# Patient Record
Sex: Male | Born: 1940
Health system: Southern US, Community
[De-identification: ages and names within clinical notes are randomized; demographics above are authoritative.]

## PROBLEM LIST (undated history)

## (undated) DIAGNOSIS — E119 Type 2 diabetes mellitus without complications: Secondary | ICD-10-CM

## (undated) DIAGNOSIS — K222 Esophageal obstruction: Secondary | ICD-10-CM

## (undated) DIAGNOSIS — N400 Enlarged prostate without lower urinary tract symptoms: Secondary | ICD-10-CM

## (undated) DIAGNOSIS — E785 Hyperlipidemia, unspecified: Secondary | ICD-10-CM

## (undated) DIAGNOSIS — K5790 Diverticulosis of intestine, part unspecified, without perforation or abscess without bleeding: Secondary | ICD-10-CM

## (undated) DIAGNOSIS — K449 Diaphragmatic hernia without obstruction or gangrene: Secondary | ICD-10-CM

## (undated) DIAGNOSIS — K219 Gastro-esophageal reflux disease without esophagitis: Secondary | ICD-10-CM

## (undated) DIAGNOSIS — N529 Male erectile dysfunction, unspecified: Secondary | ICD-10-CM

## (undated) DIAGNOSIS — R972 Elevated prostate specific antigen [PSA]: Secondary | ICD-10-CM

## (undated) HISTORY — DX: Gastro-esophageal reflux disease without esophagitis: K21.9

## (undated) HISTORY — DX: Type 2 diabetes mellitus without complications: E11.9

## (undated) HISTORY — DX: Esophageal obstruction: K22.2

## (undated) HISTORY — DX: Diaphragmatic hernia without obstruction or gangrene: K44.9

## (undated) HISTORY — DX: Male erectile dysfunction, unspecified: N52.9

## (undated) HISTORY — DX: Hyperlipidemia, unspecified: E78.5

## (undated) HISTORY — DX: Benign prostatic hyperplasia without lower urinary tract symptoms: N40.0

## (undated) HISTORY — DX: Benign prostatic hyperplasia without lower urinary tract symptoms: R97.20

## (undated) HISTORY — DX: Diverticulosis of intestine, part unspecified, without perforation or abscess without bleeding: K57.90

## (undated) HISTORY — PX: PROSTATE SURGERY: SHX751

---

## 2010-06-02 ENCOUNTER — Encounter: Admission: RE | Admit: 2010-06-02 | Discharge: 2010-06-02 | Payer: Self-pay | Admitting: Family Medicine

## 2010-12-28 ENCOUNTER — Encounter: Payer: Self-pay | Admitting: Family Medicine

## 2013-03-03 ENCOUNTER — Ambulatory Visit (INDEPENDENT_AMBULATORY_CARE_PROVIDER_SITE_OTHER): Payer: Medicare Other

## 2013-03-03 ENCOUNTER — Ambulatory Visit (INDEPENDENT_AMBULATORY_CARE_PROVIDER_SITE_OTHER): Payer: 59 | Admitting: Neurology

## 2013-03-03 DIAGNOSIS — Z0289 Encounter for other administrative examinations: Secondary | ICD-10-CM

## 2013-03-03 DIAGNOSIS — G544 Lumbosacral root disorders, not elsewhere classified: Secondary | ICD-10-CM

## 2013-03-03 DIAGNOSIS — R209 Unspecified disturbances of skin sensation: Secondary | ICD-10-CM

## 2013-03-03 NOTE — Procedures (Signed)
HISTORY:  Bryan Mccullough is a 72 year old gentleman with a 7 month history of some discomfort involving the left leg. The patient has numbness from the knee down into the foot and including the big toe. The patient has some discomfort in this distribution as well. Occasionally, the patient may note similar numbness on the right leg. The patient does have some back pain at times. The patient has a history of lumbosacral spine surgery done 27 years ago. The patient is being evaluated for a possible neuropathy or a lumbosacral radiculopathy.  NERVE CONDUCTION STUDIES:  Nerve conduction studies were performed on both lower extremities. The distal motor latencies for the peroneal nerves and posterior tibial nerves were within normal limits bilaterally. The motor amplitudes for the peroneal nerves were normal on the right, low on the left, and normal for the posterior tibial nerves bilaterally. The nerve conduction velocities for the peroneal and posterior tibial nerves were normal bilaterally. The peroneal sensory latencies were normal laterally. The H reflex latencies were symmetric and normal.   EMG STUDIES:  EMG study was performed on the left lower extremity:  The tibialis anterior muscle reveals 2 to 5K motor units with decreased recruitment. No fibrillations or positive waves were seen. The peroneus tertius muscle reveals 2 to 4K motor units with decreased recruitment. 1 plus fibrillations and positive waves were seen. The medial gastrocnemius muscle reveals 3 to 6K motor units with decreased recruitment. No fibrillations or positive waves were seen. The vastus lateralis muscle reveals 2 to 4K motor units with full recruitment. No fibrillations or positive waves were seen. The iliopsoas muscle reveals 2 to 4K motor units with full recruitment. No fibrillations or positive waves were seen. The biceps femoris muscle (long head) reveals 2 to 6K motor units with decreased recruitment. 2 plus  fibrillations and positive waves were seen. The lumbosacral paraspinal muscles were tested at 3 levels, and revealed no abnormalities of insertional activity in the upper 2 levels tested. Complex repetitive discharges were seen at the lower level. There was good relaxation.  EMG study was performed on the right lower extremity:  The tibialis anterior muscle reveals 2 to 4K motor units with full recruitment. No fibrillations or positive waves were seen. The peroneus tertius muscle reveals 2 to 4K motor units with full recruitment. No fibrillations or positive waves were seen. The medial gastrocnemius muscle reveals 1 to 3K motor units with full recruitment. No fibrillations or positive waves were seen. The vastus lateralis muscle reveals 2 to 4K motor units with full recruitment. No fibrillations or positive waves were seen. The iliopsoas muscle reveals 2 to 4K motor units with full recruitment. No fibrillations or positive waves were seen. The biceps femoris muscle (long head) reveals 2 to 4K motor units with full recruitment. No fibrillations or positive waves were seen. The lumbosacral paraspinal muscles were tested at 3 levels, and revealed no abnormalities of insertional activity at all 3 levels tested. There was good relaxation.    IMPRESSION:  Nerve conduction studies done on both lower extremities show no clear evidence of a peripheral neuropathy. The motor amplitudes of the left peroneal nerve was low, with a normal sensory latency. This finding is consistent with the EMG evaluation of the left lower extremity which shows evidence of a mixed chronic and acute L5 radiculopathy, and evidence of a chronic S1 radiculopathy. No abnormalities were noted on the right lower extremity. There is no evidence of a right lumbosacral radiculopathy.  Bryan Palau MD 03/03/2013 4:49 PM  [facility] [address] [place], [place] [address]  Phone [phone] Fax  [phone]

## 2013-03-16 ENCOUNTER — Telehealth: Payer: Self-pay | Admitting: Neurology

## 2013-03-16 NOTE — Telephone Encounter (Signed)
I called the patient and I left a message with the results of the nerve conduction study. I had already gone over this with him prior to him leaving our office. The nerve conduction studies show no evidence of a peripheral neuropathy. EMG study of the right leg was unremarkable, the left leg showed evidence of an acute and chronic L5 radiculopathy, chronic S1 radiculopathy.

## 2013-03-16 NOTE — Telephone Encounter (Signed)
Patient came into office today to find out results of NCV/EMG.  I told him doctor would have to review results and then he would receive a call with results.  He can be reached at [facility] [phone] or Cell [phone].

## 2013-03-20 NOTE — Telephone Encounter (Signed)
Noted  

## 2013-08-25 ENCOUNTER — Other Ambulatory Visit: Payer: 59

## 2013-08-25 DIAGNOSIS — E559 Vitamin D deficiency, unspecified: Secondary | ICD-10-CM

## 2013-08-25 DIAGNOSIS — Z0000 Encounter for general adult medical examination without abnormal findings: Secondary | ICD-10-CM

## 2013-08-25 DIAGNOSIS — E782 Mixed hyperlipidemia: Secondary | ICD-10-CM

## 2013-08-25 LAB — CBC WITH DIFFERENTIAL/PLATELET
Eosinophils Absolute: 0.1 10*3/uL (ref 0.0–0.7)
Eosinophils Relative: 1 % (ref 0–5)
HCT: 44.7 % (ref 39.0–52.0)
Hemoglobin: 15.4 g/dL (ref 13.0–17.0)
Lymphs Abs: 2.1 10*3/uL (ref 0.7–4.0)
MCH: 29.7 pg (ref 26.0–34.0)
MCV: 86.3 fL (ref 78.0–100.0)
Monocytes Relative: 7 % (ref 3–12)
Neutro Abs: 2.3 10*3/uL (ref 1.7–7.7)
RBC: 5.18 MIL/uL (ref 4.22–5.81)

## 2013-08-25 LAB — COMPREHENSIVE METABOLIC PANEL
Albumin: 4.2 g/dL (ref 3.5–5.2)
Alkaline Phosphatase: 87 U/L (ref 39–117)
BUN: 19 mg/dL (ref 6–23)
CO2: 28 mEq/L (ref 19–32)
Calcium: 9.6 mg/dL (ref 8.4–10.5)
Sodium: 140 mEq/L (ref 135–145)

## 2013-08-25 LAB — LIPID PANEL
HDL: 35 mg/dL — ABNORMAL LOW (ref >39)
Total CHOL/HDL Ratio: 4.8 Ratio
Triglycerides: 155 mg/dL — ABNORMAL HIGH (ref <150)
VLDL: 31 mg/dL (ref 0–40)

## 2013-08-26 LAB — VITAMIN D 25 HYDROXY (VIT D DEFICIENCY, FRACTURES): Vit D, 25-Hydroxy: 16 ng/mL — ABNORMAL LOW (ref 30–89)

## 2013-08-31 ENCOUNTER — Ambulatory Visit (INDEPENDENT_AMBULATORY_CARE_PROVIDER_SITE_OTHER): Payer: 59 | Admitting: Family Medicine

## 2013-08-31 ENCOUNTER — Encounter: Payer: Self-pay | Admitting: Family Medicine

## 2013-08-31 VITALS — BP 110/60 | HR 68 | Temp 98.5°F | Resp 14 | Ht 68.0 in | Wt 136.0 lb

## 2013-08-31 DIAGNOSIS — N529 Male erectile dysfunction, unspecified: Secondary | ICD-10-CM | POA: Insufficient documentation

## 2013-08-31 DIAGNOSIS — N400 Enlarged prostate without lower urinary tract symptoms: Secondary | ICD-10-CM | POA: Insufficient documentation

## 2013-08-31 DIAGNOSIS — Z23 Encounter for immunization: Secondary | ICD-10-CM

## 2013-08-31 DIAGNOSIS — Z0000 Encounter for general adult medical examination without abnormal findings: Secondary | ICD-10-CM

## 2013-08-31 DIAGNOSIS — K222 Esophageal obstruction: Secondary | ICD-10-CM | POA: Insufficient documentation

## 2013-08-31 NOTE — Progress Notes (Signed)
Subjective:    Patient ID: Bryan Mccullough, male    DOB: June 16, 1941, 72 y.o.   MRN: [id number]  HPI Patient is a 72 year old Hispanic male who is here today for his complete physical exam. He has no major concerns other than some mild pain in his right shoulder with abduction. He also complains of erectile dysfunction. He is interested in using Viagra. He is tried Levitra in the past with success.  per his report, he had a colonoscopy performed last year by Dr. Elnoria Howard.  His prostate examined every 6 months by his urologist Dr. Mena Goes.  He is due for a flu shot. His last tetanus shot was in August 2007. He is overdue for a pneumonia shot and a shingles shot. He declines both shots today.  He would like to receive his flu shot. Past Medical History  Diagnosis Date  . GERD (gastroesophageal reflux disease)   . BPH with elevated PSA   . Diverticulosis   . Hiatal hernia   . Esophageal stricture   . Erectile dysfunction    Past Surgical History  Procedure Laterality Date  . Prostate surgery     Current outpatient prescriptions:finasteride (PROSCAR) 5 MG tablet, Take 5 mg by mouth daily., Disp: , Rfl: ;  tamsulosin (FLOMAX) 0.4 MG CAPS capsule, Take by mouth., Disp: , Rfl:   Allergies  Allergen Reactions  . Penicillins    History   Social History  . Marital Status: Single    Spouse Name: N/A    Number of Children: N/A  . Years of Education: N/A   Occupational History  . Not on file.   Social History Main Topics  . Smoking status: Never Smoker   . Smokeless tobacco: Never Used  . Alcohol Use: No  . Drug Use: No  . Sexual Activity: Yes     Comment: married, bus driver   Other Topics Concern  . Not on file   Social History Narrative  . No narrative on file   Family History  Problem Relation Age of Onset  . Heart disease Brother   . Stroke Brother       Review of Systems  All other systems reviewed and are negative.       Objective:   Physical Exam  Vitals  reviewed. Constitutional: He is oriented to person, place, and time. He appears well-developed and well-nourished. No distress.  HENT:  Head: Normocephalic and atraumatic.  Right Ear: External ear normal.  Left Ear: External ear normal.  Nose: Nose normal.  Mouth/Throat: Oropharynx is clear and moist. No oropharyngeal exudate.  Eyes: Conjunctivae and EOM are normal. Pupils are equal, round, and reactive to light. Right eye exhibits no discharge. Left eye exhibits no discharge. No scleral icterus.  Neck: Normal range of motion. Neck supple. No JVD present. No tracheal deviation present. No thyromegaly present.  Cardiovascular: Normal rate, regular rhythm, normal heart sounds and intact distal pulses.  Exam reveals no gallop and no friction rub.   No murmur heard. Pulmonary/Chest: Effort normal and breath sounds normal. No stridor. No respiratory distress. He has no wheezes. He has no rales. He exhibits no tenderness.  Abdominal: Soft. Bowel sounds are normal. He exhibits no distension and no mass. There is no tenderness. There is no rebound and no guarding.  Musculoskeletal: Normal range of motion. He exhibits no edema and no tenderness.  Lymphadenopathy:    He has no cervical adenopathy.  Neurological: He is alert and oriented to person, place, and time. He has  normal reflexes. He displays normal reflexes. No cranial nerve deficit. He exhibits normal muscle tone. Coordination normal.  Skin: Skin is warm and dry. No rash noted. He is not diaphoretic. No erythema. No pallor.  Psychiatric: He has a normal mood and affect. His behavior is normal. Judgment and thought content normal.   patient has a positive empty can sign in the right shoulder. He has a negative Spurling's sign.        Assessment & Plan:  1. Routine general medical examination at a health care facility Patient's most recent labwork as listed below: Lab on 08/25/2013  Component Date Value Range Status  . WBC 08/25/2013 4.8   4.0 - 10.5 K/uL Final  . RBC 08/25/2013 5.18  4.22 - 5.81 MIL/uL Final  . Hemoglobin 08/25/2013 15.4  13.0 - 17.0 g/dL Final  . HCT 16/09/9603 44.7  39.0 - 52.0 % Final  . MCV 08/25/2013 86.3  78.0 - 100.0 fL Final  . MCH 08/25/2013 29.7  26.0 - 34.0 pg Final  . MCHC 08/25/2013 34.5  30.0 - 36.0 g/dL Final  . RDW 54/08/8118 12.9  11.5 - 15.5 % Final  . Platelets 08/25/2013 198  150 - 400 K/uL Final  . Neutrophils Relative % 08/25/2013 48  43 - 77 % Final  . Neutro Abs 08/25/2013 2.3  1.7 - 7.7 K/uL Final  . Lymphocytes Relative 08/25/2013 43  12 - 46 % Final  . Lymphs Abs 08/25/2013 2.1  0.7 - 4.0 K/uL Final  . Monocytes Relative 08/25/2013 7  3 - 12 % Final  . Monocytes Absolute 08/25/2013 0.3  0.1 - 1.0 K/uL Final  . Eosinophils Relative 08/25/2013 1  0 - 5 % Final  . Eosinophils Absolute 08/25/2013 0.1  0.0 - 0.7 K/uL Final  . Basophils Relative 08/25/2013 1  0 - 1 % Final  . Basophils Absolute 08/25/2013 0.0  0.0 - 0.1 K/uL Final  . Smear Review 08/25/2013 Criteria for review not met   Final  . Sodium 08/25/2013 140  135 - 145 mEq/L Final  . Potassium 08/25/2013 4.6  3.5 - 5.3 mEq/L Final  . Chloride 08/25/2013 105  96 - 112 mEq/L Final  . CO2 08/25/2013 28  19 - 32 mEq/L Final  . Glucose, Bld 08/25/2013 100* 70 - 99 mg/dL Final  . BUN 14/78/2956 19  6 - 23 mg/dL Final  . Creat 21/30/8657 0.87  0.50 - 1.35 mg/dL Final  . Total Bilirubin 08/25/2013 1.0  0.3 - 1.2 mg/dL Final  . Alkaline Phosphatase 08/25/2013 87  39 - 117 U/L Final  . AST 08/25/2013 19  0 - 37 U/L Final  . ALT 08/25/2013 14  0 - 53 U/L Final  . Total Protein 08/25/2013 6.9  6.0 - 8.3 g/dL Final  . Albumin 84/69/6295 4.2  3.5 - 5.2 g/dL Final  . Calcium 28/41/3244 9.6  8.4 - 10.5 mg/dL Final  . Cholesterol 12/09/7251 169  0 - 200 mg/dL Final   Comment: ATP III Classification:                                < 200        mg/dL        Desirable                               200 - 239  mg/dL        Borderline  High                               >= 240        mg/dL        High                             . Triglycerides 08/25/2013 155* <150 mg/dL Final  . HDL 14/78/2956 35* >39 mg/dL Final  . Total CHOL/HDL Ratio 08/25/2013 4.8   Final  . VLDL 08/25/2013 31  0 - 40 mg/dL Final  . LDL Cholesterol 08/25/2013 103* 0 - 99 mg/dL Final   Comment:                            Total Cholesterol/HDL Ratio:CHD Risk                                                 Coronary Heart Disease Risk Table                                                                 Men       Women                                   1/2 Average Risk              3.4        3.3                                       Average Risk              5.0        4.4                                    2X Average Risk              9.6        7.1                                    3X Average Risk             23.4       11.0                          Use the calculated Patient Ratio above and the CHD Risk table                           to determine the patient's CHD Risk.  ATP III Classification (LDL):                                < 100        mg/dL         Optimal                               100 - 129     mg/dL         Near or Above Optimal                               130 - 159     mg/dL         Borderline High                               160 - 189     mg/dL         High                                > 190        mg/dL         Very High                             . Vit D, 25-Hydroxy 08/25/2013 16* 30 - 89 ng/mL Final   Comment: This assay accurately quantifies Vitamin D, which is the sum of the                          25-Hydroxy forms of Vitamin D2 and D3.  Studies have shown that the                          optimum concentration of 25-Hydroxy Vitamin D is 30 ng/mL or higher.                           Concentrations of Vitamin D between 20 and 29 ng/mL are considered to                          be insufficient  and concentrations less than 20 ng/mL are considered                          to be deficient for Vitamin D.   Labs are significant only for an HDL of 35 and a vitamin D level of 16. Recommended increasing aerobic exercise to 30 minutes a day 5 days a week. I recommended that he take vitamin D 1000 units by mouth daily.  I like to recheck the patient in 6 months. The patient received his flu shot today in clinic. I strongly recommended a Pneumovax as well as Zostavax, but the patient defers for now.  Otherwise his prostate cancer screening and colonoscopy are up-to-date. His regular preventive care is up to date. I believe the pain in his right shoulder is likely subacromial bursitis/supraspinatous tendinitis.  He can take ibuprofen as needed for pain.  Recommend cortisone shot if worsening.

## 2013-08-31 NOTE — Addendum Note (Signed)
Addended by: Legrand Rams B on: 08/31/2013 01:06 PM   Modules accepted: Orders

## 2013-09-08 ENCOUNTER — Encounter: Payer: Medicare Other | Admitting: Family Medicine

## 2014-10-01 ENCOUNTER — Ambulatory Visit (INDEPENDENT_AMBULATORY_CARE_PROVIDER_SITE_OTHER): Payer: Medicare HMO | Admitting: Family Medicine

## 2014-10-01 ENCOUNTER — Encounter: Payer: Self-pay | Admitting: Family Medicine

## 2014-10-01 VITALS — BP 140/76 | HR 60 | Temp 98.0°F | Resp 14 | Ht 68.0 in | Wt 136.0 lb

## 2014-10-01 DIAGNOSIS — M542 Cervicalgia: Secondary | ICD-10-CM

## 2014-10-01 DIAGNOSIS — G8929 Other chronic pain: Secondary | ICD-10-CM

## 2014-10-01 DIAGNOSIS — M7551 Bursitis of right shoulder: Secondary | ICD-10-CM

## 2014-10-01 DIAGNOSIS — M7591 Shoulder lesion, unspecified, right shoulder: Secondary | ICD-10-CM

## 2014-10-01 MED ORDER — MELOXICAM 15 MG PO TABS: 15.0000 mg | ORAL_TABLET | Freq: Every day | ORAL | Status: DC

## 2014-10-01 NOTE — Progress Notes (Signed)
Subjective:    Patient ID: Bryan Mccullough, male    DOB: 1941/12/04, 73 y.o.   MRN: [id number]  HPI Patient presents with 3 months of pain in his neck. The pain is located just superior to C7. The patient has palpable crepitus with range of motion in the neck including rotation and flexion and extension. He complains of a dull constant pain worse with head motion. He denies any injury. He also complains of pain in his right shoulder which is worse with abduction greater than 90 and external rotation of the pain is located in the subacromial space and radiates down the right arm to the mid biceps. He also some tenderness to palpation at the insertion of the biceps on the humerus. He denies any specific injury. This has been ongoing for several months. It was exacerbated by his job. Past Medical History  Diagnosis Date  . GERD (gastroesophageal reflux disease)   . BPH with elevated PSA   . Diverticulosis   . Hiatal hernia   . Esophageal stricture   . Erectile dysfunction    Past Surgical History  Procedure Laterality Date  . Prostate surgery     Current Outpatient Prescriptions on File Prior to Visit  Medication Sig Dispense Refill  . finasteride (PROSCAR) 5 MG tablet Take 5 mg by mouth daily.      . tamsulosin (FLOMAX) 0.4 MG CAPS capsule Take by mouth.       No current facility-administered medications on file prior to visit.   Allergies  Allergen Reactions  . Penicillins    History   Social History  . Marital Status: Single    Spouse Name: N/A    Number of Children: N/A  . Years of Education: N/A   Occupational History  . Not on file.   Social History Main Topics  . Smoking status: Never Smoker   . Smokeless tobacco: Never Used  . Alcohol Use: No  . Drug Use: No  . Sexual Activity: Yes     Comment: married, bus driver   Other Topics Concern  . Not on file   Social History Narrative  . No narrative on file      Review of Systems  All other systems  reviewed and are negative.      Objective:   Physical Exam  Vitals reviewed. Cardiovascular: Normal rate, regular rhythm and normal heart sounds.   No murmur heard. Pulmonary/Chest: Effort normal and breath sounds normal. No respiratory distress. He has no wheezes. He has no rales.  Musculoskeletal:       Right shoulder: He exhibits decreased range of motion, tenderness and pain. He exhibits no bony tenderness, no effusion, no crepitus, no deformity and normal strength.       Cervical back: He exhibits decreased range of motion, tenderness and pain. He exhibits no bony tenderness.          Assessment & Plan:  Neck pain of over 3 months duration - Plan: meloxicam (MOBIC) 15 MG tablet  Supraspinatus tendonitis, right  I suspect degenerative disc disease in the neck along with spondylosis.  I suspect the patient's shoulder pain is due to tendinitis in his rotator cuff and possibly his biceps tendon. I will try to treat both situations simultaneously with meloxicam 15 mg by mouth daily. I recommended the patient begin Zantac 150 mg by mouth daily at bedtime given his mild GERD to help prevent exacerbations of this. Recheck in 2 weeks. If no better at that time proceed  with an x-ray of the neck and the shoulder. I would also consider a cortisone injection in the shoulder at that time if no better..Marland Kitchen

## 2015-06-07 ENCOUNTER — Other Ambulatory Visit: Payer: Medicare HMO

## 2015-06-07 DIAGNOSIS — N400 Enlarged prostate without lower urinary tract symptoms: Secondary | ICD-10-CM

## 2015-06-07 DIAGNOSIS — E559 Vitamin D deficiency, unspecified: Secondary | ICD-10-CM

## 2015-06-07 DIAGNOSIS — Z79899 Other long term (current) drug therapy: Secondary | ICD-10-CM

## 2015-06-07 LAB — CBC WITH DIFFERENTIAL/PLATELET
BASOS ABS: 0 10*3/uL (ref 0.0–0.1)
BASOS PCT: 0 % (ref 0–1)
EOS ABS: 0.1 10*3/uL (ref 0.0–0.7)
EOS PCT: 2 % (ref 0–5)
HEMATOCRIT: 44.5 % (ref 39.0–52.0)
HEMOGLOBIN: 15.5 g/dL (ref 13.0–17.0)
LYMPHS ABS: 2.6 10*3/uL (ref 0.7–4.0)
LYMPHS PCT: 47 % — AB (ref 12–46)
MCH: 29.9 pg (ref 26.0–34.0)
MCHC: 34.8 g/dL (ref 30.0–36.0)
MCV: 85.9 fL (ref 78.0–100.0)
MONO ABS: 0.4 10*3/uL (ref 0.1–1.0)
MONOS PCT: 8 % (ref 3–12)
MPV: 9.6 fL (ref 8.6–12.4)
NEUTROS PCT: 43 % (ref 43–77)
Neutro Abs: 2.4 10*3/uL (ref 1.7–7.7)
PLATELETS: 173 10*3/uL (ref 150–400)
RBC: 5.18 MIL/uL (ref 4.22–5.81)
RDW: 13.7 % (ref 11.5–15.5)
WBC: 5.6 10*3/uL (ref 4.0–10.5)

## 2015-06-07 LAB — COMPLETE METABOLIC PANEL WITH GFR
ALBUMIN: 3.9 g/dL (ref 3.5–5.2)
ALT: 17 U/L (ref 0–53)
AST: 20 U/L (ref 0–37)
Alkaline Phosphatase: 82 U/L (ref 39–117)
BUN: 22 mg/dL (ref 6–23)
CO2: 25 mEq/L (ref 19–32)
Calcium: 9.1 mg/dL (ref 8.4–10.5)
Chloride: 104 mEq/L (ref 96–112)
Creat: 0.79 mg/dL (ref 0.50–1.35)
Glucose, Bld: 93 mg/dL (ref 70–99)
POTASSIUM: 4.1 meq/L (ref 3.5–5.3)
SODIUM: 141 meq/L (ref 135–145)
TOTAL PROTEIN: 6.7 g/dL (ref 6.0–8.3)
Total Bilirubin: 0.8 mg/dL (ref 0.2–1.2)

## 2015-06-07 LAB — LIPID PANEL
Cholesterol: 171 mg/dL (ref 0–200)
HDL: 33 mg/dL — ABNORMAL LOW (ref >=40)
LDL CALC: 93 mg/dL (ref 0–99)
TRIGLYCERIDES: 223 mg/dL — AB (ref <150)
Total CHOL/HDL Ratio: 5.2 Ratio
VLDL: 45 mg/dL — ABNORMAL HIGH (ref 0–40)

## 2015-06-08 LAB — VITAMIN D 25 HYDROXY (VIT D DEFICIENCY, FRACTURES): VIT D 25 HYDROXY: 13 ng/mL — AB (ref 30–100)

## 2015-06-13 ENCOUNTER — Ambulatory Visit: Payer: Medicare HMO | Admitting: Physician Assistant

## 2015-06-17 ENCOUNTER — Encounter: Payer: Self-pay | Admitting: Family Medicine

## 2015-06-17 ENCOUNTER — Ambulatory Visit (INDEPENDENT_AMBULATORY_CARE_PROVIDER_SITE_OTHER): Payer: Medicare HMO | Admitting: Family Medicine

## 2015-06-17 VITALS — BP 118/60 | HR 78 | Temp 98.1°F | Resp 14 | Ht 68.0 in | Wt 138.0 lb

## 2015-06-17 DIAGNOSIS — R1032 Left lower quadrant pain: Secondary | ICD-10-CM

## 2015-06-17 DIAGNOSIS — G8929 Other chronic pain: Secondary | ICD-10-CM

## 2015-06-17 DIAGNOSIS — R1031 Right lower quadrant pain: Secondary | ICD-10-CM

## 2015-06-17 NOTE — Progress Notes (Signed)
Subjective:    Patient ID: Bryan Mccullough, male    DOB: 19-Oct-1941, 74 y.o.   MRN: [id number]  HPI Patient is reporting vague abdominal pain.  It is located in the right lower quadrant and the left lower quadrant. It has been there for several years. He is seeing GI was performed a colonoscopy which was significant only for diverticulosis and internal hemorrhoids. He sees a urologist for BPH and has his prostate checked annually. They also check his PSA.  Patient had similar pain dating all the way back to 2011. I checked a CT scan at that time which revealed no abnormalities other than a thick walled bladder and enlarged prostate. The pain has not changed. It occurs on a daily basis. It is a bloating feeling that tends to improve with bowel movements. Patient states that he is not having a bowel movement but only every 3 days. They tend to be hard. He denies any melanoma or hematochezia. He denies any fevers or chills. He denies any nausea or vomiting or bilious emesis. He denies any weight loss. He denies any rash. He does continue have mild constipation. Movement does not exacerbate the pain. His most recent lab work as listed below Lab on 06/07/2015  Component Date Value Ref Range Status  . Sodium 06/07/2015 141  135 - 145 mEq/L Final  . Potassium 06/07/2015 4.1  3.5 - 5.3 mEq/L Final  . Chloride 06/07/2015 104  96 - 112 mEq/L Final  . CO2 06/07/2015 25  19 - 32 mEq/L Final  . Glucose, Bld 06/07/2015 93  70 - 99 mg/dL Final  . BUN 06/07/2015 22  6 - 23 mg/dL Final  . Creat 06/07/2015 0.79  0.50 - 1.35 mg/dL Final  . Total Bilirubin 06/07/2015 0.8  0.2 - 1.2 mg/dL Final  . Alkaline Phosphatase 06/07/2015 82  39 - 117 U/L Final  . AST 06/07/2015 20  0 - 37 U/L Final  . ALT 06/07/2015 17  0 - 53 U/L Final  . Total Protein 06/07/2015 6.7  6.0 - 8.3 g/dL Final  . Albumin 06/07/2015 3.9  3.5 - 5.2 g/dL Final  . Calcium 06/07/2015 9.1  8.4 - 10.5 mg/dL Final  . GFR, Est African American  06/07/2015 >89   Final  . GFR, Est Non African American 06/07/2015 >89   Final   Comment:   The estimated GFR is a calculation valid for adults (>=82 years old) that uses the CKD-EPI algorithm to adjust for age and sex. It is   not to be used for children, pregnant women, hospitalized patients,    patients on dialysis, or with rapidly changing kidney function. According to the NKDEP, eGFR >89 is normal, 60-89 shows mild impairment, 30-59 shows moderate impairment, 15-29 shows severe impairment and <15 is ESRD.     Marland Kitchen Cholesterol 06/07/2015 171  0 - 200 mg/dL Final   Comment: ATP III Classification:       < 200        mg/dL        Desirable      200 - 239     mg/dL        Borderline High      >= 240        mg/dL        High     . Triglycerides 06/07/2015 223* <150 mg/dL Final  . HDL 06/07/2015 33* >=40 mg/dL Final  . Total CHOL/HDL Ratio 06/07/2015 5.2   Final  .  VLDL 06/07/2015 45* 0 - 40 mg/dL Final  . LDL Cholesterol 06/07/2015 93  0 - 99 mg/dL Final   Comment:   Total Cholesterol/HDL Ratio:CHD Risk                        Coronary Heart Disease Risk Table                                        Men       Women          1/2 Average Risk              3.4        3.3              Average Risk              5.0        4.4           2X Average Risk              9.6        7.1           3X Average Risk             23.4       11.0 Use the calculated Patient Ratio above and the CHD Risk table  to determine the patient's CHD Risk. ATP III Classification (LDL):       < 100        mg/dL         Optimal      100 - 129     mg/dL         Near or Above Optimal      130 - 159     mg/dL         Borderline High      160 - 189     mg/dL         High       > 190        mg/dL         Very High     . WBC 06/07/2015 5.6  4.0 - 10.5 K/uL Final  . RBC 06/07/2015 5.18  4.22 - 5.81 MIL/uL Final  . Hemoglobin 06/07/2015 15.5  13.0 - 17.0 g/dL Final  . HCT 06/07/2015 44.5  39.0 - 52.0 % Final  . MCV  06/07/2015 85.9  78.0 - 100.0 fL Final  . MCH 06/07/2015 29.9  26.0 - 34.0 pg Final  . MCHC 06/07/2015 34.8  30.0 - 36.0 g/dL Final  . RDW 06/07/2015 13.7  11.5 - 15.5 % Final  . Platelets 06/07/2015 173  150 - 400 K/uL Final  . MPV 06/07/2015 9.6  8.6 - 12.4 fL Final  . Neutrophils Relative % 06/07/2015 43  43 - 77 % Final  . Neutro Abs 06/07/2015 2.4  1.7 - 7.7 K/uL Final  . Lymphocytes Relative 06/07/2015 47* 12 - 46 % Final  . Lymphs Abs 06/07/2015 2.6  0.7 - 4.0 K/uL Final  . Monocytes Relative 06/07/2015 8  3 - 12 % Final  . Monocytes Absolute 06/07/2015 0.4  0.1 - 1.0 K/uL Final  . Eosinophils Relative 06/07/2015 2  0 - 5 % Final  . Eosinophils Absolute 06/07/2015 0.1  0.0 - 0.7 K/uL Final  . Basophils Relative  06/07/2015 0  0 - 1 % Final  . Basophils Absolute 06/07/2015 0.0  0.0 - 0.1 K/uL Final  . Smear Review 06/07/2015 Criteria for review not met   Final  . Vit D, 25-Hydroxy 06/07/2015 13* 30 - 100 ng/mL Final   Comment: Vitamin D Status           25-OH Vitamin D        Deficiency                <20 ng/mL        Insufficiency         20 - 29 ng/mL        Optimal             > or = 30 ng/mL   For 25-OH Vitamin D testing on patients on D2-supplementation and patients for whom quantitation of D2 and D3 fractions is required, the [facility] 25-OH VIT D, (D2,D3), LC/MS/MS is recommended: order code [id number] (patients > 2 yrs).    Past Medical History  Diagnosis Date  . GERD (gastroesophageal reflux disease)   . BPH with elevated PSA   . Diverticulosis   . Hiatal hernia   . Esophageal stricture   . Erectile dysfunction    Past Surgical History  Procedure Laterality Date  . Prostate surgery     Current Outpatient Prescriptions on File Prior to Visit  Medication Sig Dispense Refill  . finasteride (PROSCAR) 5 MG tablet Take 5 mg by mouth daily.    . tamsulosin (FLOMAX) 0.4 MG CAPS capsule Take by mouth.     No current facility-administered medications on file prior to  visit.   Allergies  Allergen Reactions  . Penicillins    History   Social History  . Marital Status: Single    Spouse Name: N/A  . Number of Children: N/A  . Years of Education: N/A   Occupational History  . Not on file.   Social History Main Topics  . Smoking status: Never Smoker   . Smokeless tobacco: Never Used  . Alcohol Use: No  . Drug Use: No  . Sexual Activity: Yes     Comment: married, bus driver   Other Topics Concern  . Not on file   Social History Narrative      Review of Systems  All other systems reviewed and are negative.      Objective:   Physical Exam  Constitutional: He appears well-developed and well-nourished.  Cardiovascular: Normal rate, regular rhythm, normal heart sounds and intact distal pulses.   No murmur heard. Pulmonary/Chest: Effort normal and breath sounds normal. No respiratory distress. He has no wheezes. He has no rales.  Abdominal: Soft. Bowel sounds are normal. He exhibits no distension and no mass. There is no tenderness. There is no rebound and no guarding.          Assessment & Plan:  Chronic bilateral lower abdominal pain  Patient's lab work is excellent. I believe the patient's abdominal pain which is chronic may be IBS versus diverticular pain related to chronic constipation. Therefore I will start the patient on linzess 145 mcg poqday and recheck in 2 weeks.

## 2015-10-14 DIAGNOSIS — H524 Presbyopia: Secondary | ICD-10-CM | POA: Diagnosis not present

## 2015-10-14 DIAGNOSIS — H25093 Other age-related incipient cataract, bilateral: Secondary | ICD-10-CM | POA: Diagnosis not present

## 2015-10-14 DIAGNOSIS — H52223 Regular astigmatism, bilateral: Secondary | ICD-10-CM | POA: Diagnosis not present

## 2015-10-14 DIAGNOSIS — H5203 Hypermetropia, bilateral: Secondary | ICD-10-CM | POA: Diagnosis not present

## 2016-01-08 ENCOUNTER — Other Ambulatory Visit: Payer: Medicare HMO

## 2016-01-08 DIAGNOSIS — N400 Enlarged prostate without lower urinary tract symptoms: Secondary | ICD-10-CM | POA: Diagnosis not present

## 2016-01-08 DIAGNOSIS — K219 Gastro-esophageal reflux disease without esophagitis: Secondary | ICD-10-CM

## 2016-01-08 DIAGNOSIS — Z79899 Other long term (current) drug therapy: Secondary | ICD-10-CM

## 2016-01-08 DIAGNOSIS — R972 Elevated prostate specific antigen [PSA]: Secondary | ICD-10-CM | POA: Diagnosis not present

## 2016-01-08 DIAGNOSIS — E781 Pure hyperglyceridemia: Secondary | ICD-10-CM

## 2016-01-08 LAB — CBC WITH DIFFERENTIAL/PLATELET
BASOS ABS: 0.1 10*3/uL (ref 0.0–0.1)
Basophils Relative: 1 % (ref 0–1)
EOS ABS: 0.2 10*3/uL (ref 0.0–0.7)
EOS PCT: 3 % (ref 0–5)
HCT: 47.8 % (ref 39.0–52.0)
Hemoglobin: 16 g/dL (ref 13.0–17.0)
Lymphocytes Relative: 46 % (ref 12–46)
Lymphs Abs: 2.9 10*3/uL (ref 0.7–4.0)
MCH: 29.4 pg (ref 26.0–34.0)
MCHC: 33.5 g/dL (ref 30.0–36.0)
MCV: 87.9 fL (ref 78.0–100.0)
MONO ABS: 0.5 10*3/uL (ref 0.1–1.0)
MPV: 9.9 fL (ref 8.6–12.4)
Monocytes Relative: 8 % (ref 3–12)
Neutro Abs: 2.6 10*3/uL (ref 1.7–7.7)
Neutrophils Relative %: 42 % — ABNORMAL LOW (ref 43–77)
PLATELETS: 208 10*3/uL (ref 150–400)
RBC: 5.44 MIL/uL (ref 4.22–5.81)
RDW: 13.8 % (ref 11.5–15.5)
WBC: 6.2 10*3/uL (ref 4.0–10.5)

## 2016-01-08 LAB — COMPLETE METABOLIC PANEL WITH GFR
ALT: 20 U/L (ref 9–46)
AST: 23 U/L (ref 10–35)
Albumin: 4.2 g/dL (ref 3.6–5.1)
Alkaline Phosphatase: 89 U/L (ref 40–115)
BUN: 16 mg/dL (ref 7–25)
CALCIUM: 9.3 mg/dL (ref 8.6–10.3)
CHLORIDE: 103 mmol/L (ref 98–110)
CO2: 28 mmol/L (ref 20–31)
CREATININE: 0.94 mg/dL (ref 0.70–1.18)
GFR, Est African American: >89 mL/min (ref >=60)
GFR, Est Non African American: 80 mL/min (ref >=60)
GLUCOSE: 100 mg/dL — AB (ref 70–99)
Potassium: 4.7 mmol/L (ref 3.5–5.3)
SODIUM: 138 mmol/L (ref 135–146)
Total Bilirubin: 0.4 mg/dL (ref 0.2–1.2)
Total Protein: 7 g/dL (ref 6.1–8.1)

## 2016-01-08 LAB — LIPID PANEL
CHOL/HDL RATIO: 6 ratio — AB (ref <=5.0)
Cholesterol: 209 mg/dL — ABNORMAL HIGH (ref 125–200)
HDL: 35 mg/dL — ABNORMAL LOW (ref >=40)
LDL Cholesterol: 129 mg/dL (ref <130)
Triglycerides: 224 mg/dL — ABNORMAL HIGH (ref <150)
VLDL: 45 mg/dL — AB (ref <30)

## 2016-01-08 LAB — TSH: TSH: 4.1 u[IU]/mL (ref 0.350–4.500)

## 2016-01-09 LAB — PSA, MEDICARE: PSA: 4.25 ng/mL — AB (ref <=4.00)

## 2016-01-14 ENCOUNTER — Encounter: Payer: Self-pay | Admitting: Family Medicine

## 2016-01-14 ENCOUNTER — Ambulatory Visit (INDEPENDENT_AMBULATORY_CARE_PROVIDER_SITE_OTHER): Payer: Medicare HMO | Admitting: Family Medicine

## 2016-01-14 VITALS — BP 134/82 | HR 72 | Temp 98.3°F | Resp 18 | Ht 65.5 in | Wt 141.0 lb

## 2016-01-14 DIAGNOSIS — Z23 Encounter for immunization: Secondary | ICD-10-CM | POA: Diagnosis not present

## 2016-01-14 DIAGNOSIS — M7551 Bursitis of right shoulder: Secondary | ICD-10-CM | POA: Diagnosis not present

## 2016-01-14 DIAGNOSIS — G8929 Other chronic pain: Secondary | ICD-10-CM | POA: Diagnosis not present

## 2016-01-14 DIAGNOSIS — R1031 Right lower quadrant pain: Secondary | ICD-10-CM

## 2016-01-14 DIAGNOSIS — M542 Cervicalgia: Secondary | ICD-10-CM

## 2016-01-14 DIAGNOSIS — R1032 Left lower quadrant pain: Secondary | ICD-10-CM

## 2016-01-14 DIAGNOSIS — Z0000 Encounter for general adult medical examination without abnormal findings: Secondary | ICD-10-CM

## 2016-01-14 DIAGNOSIS — M7591 Shoulder lesion, unspecified, right shoulder: Secondary | ICD-10-CM

## 2016-01-14 MED ORDER — MELOXICAM 15 MG PO TABS: 15.0000 mg | ORAL_TABLET | Freq: Every day | ORAL | Status: DC

## 2016-01-14 NOTE — Progress Notes (Signed)
Subjective:    Patient ID: Bryan Mccullough, male    DOB: 01-16-1941, 75 y.o.   MRN: [id number]  HPI  06/17/15 Patient is reporting vague abdominal pain.  It is located in the right lower quadrant and the left lower quadrant. It has been there for several years. He is seeing GI was performed a colonoscopy which was significant only for diverticulosis and internal hemorrhoids. He sees a urologist for BPH and has his prostate checked annually. They also check his PSA.  Patient had similar pain dating all the way back to 2011. I checked a CT scan at that time which revealed no abnormalities other than a thick walled bladder and enlarged prostate. The pain has not changed. It occurs on a daily basis. It is a bloating feeling that tends to improve with bowel movements. Patient states that he is not having a bowel movement but only every 3 days. They tend to be hard. He denies any melanoma or hematochezia. He denies any fevers or chills. He denies any nausea or vomiting or bilious emesis. He denies any weight loss. He denies any rash. He does continue have mild constipation. Movement does not exacerbate the pain. At that time,my plan was: Patient's lab work is excellent. I believe the patient's abdominal pain which is chronic may be IBS versus diverticular pain related to chronic constipation. Therefore I will start the patient on linzess 145 mcg poqday and recheck in 2 weeks. 01/14/16 Patient is here today for complete physical exam. He continues to report lower abdominal pain that has been chronic and intermittent for the last 6 years. His last colonoscopy was in 2012. At that time his colonoscopy was significant only for diverticulosis. He has not had any further workup aside from a normal colonoscopy normal lab work normal CT scan and urology referral. However the pain is vague. There is no particular exacerbating or alleviating factors. It tends to be located in the left lower quadrant. Certainly IBS versus  diverticulosis are leading possible causes , differential diagnosis. He also complains of joint pain in his neck, in both shoulders. He has pain with abduction in his left shoulder greater than 90. He has crepitus in his left shoulder. He has pain with internal Rotation of the left shoulder. He has a slightly positive empty can sign his left shoulder. He has a positive Hawkins sign. He has pain with range of motion in the right shoulder.Marland Kitchen He also has crepitus in his cervical spine. He also complains of numbness in his left leg which is chronic for the last 3 years. He has a history of degenerative disc disease with a bulging disc in his lumbar spine. He received 3 cortisone injections for that and then was referred to a neurologist by the orthopedic office. He stopped following up as the symptoms did not improve. He is due for a flu shot today as well as Pneumovax 23 Past Medical History  Diagnosis Date  . GERD (gastroesophageal reflux disease)   . BPH with elevated PSA   . Diverticulosis   . Hiatal hernia   . Esophageal stricture   . Erectile dysfunction    Past Surgical History  Procedure Laterality Date  . Prostate surgery    Had LUTS in 2002 by Dr. Rosana Hoes.  Last saw urology in 08/2015 with nml DRE and stable PSA.   Current Outpatient Prescriptions on File Prior to Visit  Medication Sig Dispense Refill  . finasteride (PROSCAR) 5 MG tablet Take 5 mg by mouth  daily.    . tamsulosin (FLOMAX) 0.4 MG CAPS capsule Take by mouth.     No current facility-administered medications on file prior to visit.   Allergies  Allergen Reactions  . Penicillins    Social History   Social History  . Marital Status: Single    Spouse Name: N/A  . Number of Children: N/A  . Years of Education: N/A   Occupational History  . Not on file.   Social History Main Topics  . Smoking status: Never Smoker   . Smokeless tobacco: Never Used  . Alcohol Use: No  . Drug Use: No  . Sexual Activity: Yes      Comment: married, bus driver   Other Topics Concern  . Not on file   Social History Narrative   Family History  Problem Relation Age of Onset  . Heart disease Brother   . Stroke Brother      Review of Systems  All other systems reviewed and are negative.      Objective:   Physical Exam  Constitutional: He is oriented to person, place, and time. He appears well-developed and well-nourished. No distress.  HENT:  Head: Normocephalic and atraumatic.  Right Ear: External ear normal.  Left Ear: External ear normal.  Nose: Nose normal.  Mouth/Throat: Oropharynx is clear and moist. No oropharyngeal exudate.  Eyes: Conjunctivae and EOM are normal. Pupils are equal, round, and reactive to light. Right eye exhibits no discharge. Left eye exhibits no discharge. No scleral icterus.  Neck: Normal range of motion. Neck supple. No JVD present. No tracheal deviation present. No thyromegaly present.  Cardiovascular: Normal rate, regular rhythm, normal heart sounds and intact distal pulses.  Exam reveals no gallop and no friction rub.   No murmur heard. Pulmonary/Chest: Effort normal and breath sounds normal. No stridor. No respiratory distress. He has no wheezes. He has no rales. He exhibits no tenderness.  Abdominal: Soft. Bowel sounds are normal. He exhibits no distension and no mass. There is no tenderness. There is no rebound and no guarding.  Musculoskeletal: He exhibits no edema.       Right shoulder: He exhibits decreased range of motion, tenderness, crepitus and pain. He exhibits no bony tenderness and no effusion.       Left shoulder: He exhibits decreased range of motion, tenderness, crepitus, pain and decreased strength.       Cervical back: He exhibits tenderness and pain. He exhibits normal range of motion and no spasm.  Lymphadenopathy:    He has no cervical adenopathy.  Neurological: He is alert and oriented to person, place, and time. He has normal reflexes. He displays normal  reflexes. No cranial nerve deficit. He exhibits normal muscle tone. Coordination normal.  Skin: Skin is warm. No rash noted. He is not diaphoretic. No erythema. No pallor.  Psychiatric: He has a normal mood and affect. His behavior is normal. Judgment and thought content normal.  Vitals reviewed.  Lab on 01/08/2016  Component Date Value Ref Range Status  . PSA 01/08/2016 4.25* <=4.00 ng/mL Final   Comment: Test Methodology: ECLIA PSA (Electrochemiluminescence Immunoassay)   For PSA values from 2.5-4.0, particularly in younger men <80 years old, the AUA and NCCN suggest testing for % Free PSA (3515) and evaluation of the rate of increase in PSA (PSA velocity).   . Sodium 01/08/2016 138  135 - 146 mmol/L Final  . Potassium 01/08/2016 4.7  3.5 - 5.3 mmol/L Final  . Chloride 01/08/2016 103  98 -  110 mmol/L Final  . CO2 01/08/2016 28  20 - 31 mmol/L Final  . Glucose, Bld 01/08/2016 100* 70 - 99 mg/dL Final  . BUN 01/08/2016 16  7 - 25 mg/dL Final  . Creat 01/08/2016 0.94  0.70 - 1.18 mg/dL Final  . Total Bilirubin 01/08/2016 0.4  0.2 - 1.2 mg/dL Final  . Alkaline Phosphatase 01/08/2016 89  40 - 115 U/L Final  . AST 01/08/2016 23  10 - 35 U/L Final  . ALT 01/08/2016 20  9 - 46 U/L Final  . Total Protein 01/08/2016 7.0  6.1 - 8.1 g/dL Final  . Albumin 01/08/2016 4.2  3.6 - 5.1 g/dL Final  . Calcium 01/08/2016 9.3  8.6 - 10.3 mg/dL Final  . GFR, Est African American 01/08/2016 >89  >=60 mL/min Final  . GFR, Est Non African American 01/08/2016 80  >=60 mL/min Final   Comment:   The estimated GFR is a calculation valid for adults (>=30 years old) that uses the CKD-EPI algorithm to adjust for age and sex. It is   not to be used for children, pregnant women, hospitalized patients,    patients on dialysis, or with rapidly changing kidney function. According to the NKDEP, eGFR >89 is normal, 60-89 shows mild impairment, 30-59 shows moderate impairment, 15-29 shows severe impairment and <15 is  ESRD.     . TSH 01/08/2016 4.100  0.350 - 4.500 uIU/mL Final  . Cholesterol 01/08/2016 209* 125 - 200 mg/dL Final  . Triglycerides 01/08/2016 224* <150 mg/dL Final  . HDL 01/08/2016 35* >=40 mg/dL Final  . Total CHOL/HDL Ratio 01/08/2016 6.0* <=5.0 Ratio Final  . VLDL 01/08/2016 45* <30 mg/dL Final  . LDL Cholesterol 01/08/2016 129  <130 mg/dL Final   Comment:   Total Cholesterol/HDL Ratio:CHD Risk                        Coronary Heart Disease Risk Table                                        Men       Women          1/2 Average Risk              3.4        3.3              Average Risk              5.0        4.4           2X Average Risk              9.6        7.1           3X Average Risk             23.4       11.0 Use the calculated Patient Ratio above and the CHD Risk table  to determine the patient's CHD Risk.   . WBC 01/08/2016 6.2  4.0 - 10.5 K/uL Final  . RBC 01/08/2016 5.44  4.22 - 5.81 MIL/uL Final  . Hemoglobin 01/08/2016 16.0  13.0 - 17.0 g/dL Final  . HCT 01/08/2016 47.8  39.0 - 52.0 % Final  . MCV 01/08/2016 87.9  78.0 - 100.0 fL Final  . [facility] 01/08/2016  29.4  26.0 - 34.0 pg Final  . MCHC 01/08/2016 33.5  30.0 - 36.0 g/dL Final  . RDW 01/08/2016 13.8  11.5 - 15.5 % Final  . Platelets 01/08/2016 208  150 - 400 K/uL Final  . MPV 01/08/2016 9.9  8.6 - 12.4 fL Final  . Neutrophils Relative % 01/08/2016 42* 43 - 77 % Final  . Neutro Abs 01/08/2016 2.6  1.7 - 7.7 K/uL Final  . Lymphocytes Relative 01/08/2016 46  12 - 46 % Final  . Lymphs Abs 01/08/2016 2.9  0.7 - 4.0 K/uL Final  . Monocytes Relative 01/08/2016 8  3 - 12 % Final  . Monocytes Absolute 01/08/2016 0.5  0.1 - 1.0 K/uL Final  . Eosinophils Relative 01/08/2016 3  0 - 5 % Final  . Eosinophils Absolute 01/08/2016 0.2  0.0 - 0.7 K/uL Final  . Basophils Relative 01/08/2016 1  0 - 1 % Final  . Basophils Absolute 01/08/2016 0.1  0.0 - 0.1 K/uL Final  . Smear Review 01/08/2016 Criteria for review not met   Final            Assessment & Plan:  Chronic bilateral lower abdominal pain  Neck pain of over 3 months duration  Supraspinatus tendonitis, right  Routine general medical examination at a health care facility  Lab work is unremarkable. Cholesterol has gone up slightly. I recommended a low fat diet, low saturated fat diet, and decreased consumption of meat, and sweets. I recommended more fresh fruits and vegetables. A believe the pain in his neck and in his shoulders are likely due to osteoarthritis up with possible super spin not as tendinitis in his left shoulder. I recommended trying meloxicam 15 mg by mouth daily and pain is no better in one month's time, I will proceed with x-rays of the shoulders and consider a cortisone injection in the left shoulder. I believe his chronic lower abdominal pain is likely IBS versus diverticulosis. I'm not able to spend adequate time discussing all of these lesions at once and perform a physical today and therefore I have recommended that he follow back up with his gastroenterologist if he continues to have abdominal pain. Apparently, linzess did not help. He denies any chronic constipation, blood in his stool, or chronic diarrhea. Pain has been present now for almost 6 years and therefore I believe is more likely IBS. He received his flu shot as well as Pneumovax 23 today. His PSA is slightly elevated but this is stable compared to the PSA he had checked at his urologist office in September. He follows up with his urologist later this year.

## 2016-02-18 DIAGNOSIS — N401 Enlarged prostate with lower urinary tract symptoms: Secondary | ICD-10-CM | POA: Diagnosis not present

## 2016-03-03 DIAGNOSIS — N401 Enlarged prostate with lower urinary tract symptoms: Secondary | ICD-10-CM | POA: Diagnosis not present

## 2016-03-03 DIAGNOSIS — N3941 Urge incontinence: Secondary | ICD-10-CM | POA: Diagnosis not present

## 2016-03-03 DIAGNOSIS — R972 Elevated prostate specific antigen [PSA]: Secondary | ICD-10-CM | POA: Diagnosis not present

## 2016-03-03 DIAGNOSIS — Z0000 Encounter for general adult medical examination without abnormal findings: Secondary | ICD-10-CM | POA: Diagnosis not present

## 2016-03-03 DIAGNOSIS — N138 Other obstructive and reflux uropathy: Secondary | ICD-10-CM | POA: Diagnosis not present

## 2016-06-11 DIAGNOSIS — R972 Elevated prostate specific antigen [PSA]: Secondary | ICD-10-CM | POA: Diagnosis not present

## 2016-07-17 ENCOUNTER — Encounter: Payer: Self-pay | Admitting: Family Medicine

## 2016-07-17 ENCOUNTER — Ambulatory Visit (INDEPENDENT_AMBULATORY_CARE_PROVIDER_SITE_OTHER): Payer: Medicare HMO | Admitting: Family Medicine

## 2016-07-17 VITALS — BP 118/64 | HR 80 | Temp 98.1°F | Resp 16 | Wt 143.0 lb

## 2016-07-17 DIAGNOSIS — M7551 Bursitis of right shoulder: Secondary | ICD-10-CM

## 2016-07-17 DIAGNOSIS — M7552 Bursitis of left shoulder: Secondary | ICD-10-CM | POA: Diagnosis not present

## 2016-07-17 DIAGNOSIS — K219 Gastro-esophageal reflux disease without esophagitis: Secondary | ICD-10-CM

## 2016-07-17 DIAGNOSIS — K589 Irritable bowel syndrome without diarrhea: Secondary | ICD-10-CM | POA: Diagnosis not present

## 2016-07-17 DIAGNOSIS — M7592 Shoulder lesion, unspecified, left shoulder: Secondary | ICD-10-CM

## 2016-07-17 MED ORDER — ESOMEPRAZOLE MAGNESIUM 40 MG PO CPDR: 40.0000 mg | DELAYED_RELEASE_CAPSULE | Freq: Every day | ORAL | 3 refills | Status: DC

## 2016-07-17 MED ORDER — ALIGN 4 MG PO CAPS: 1.0000 | ORAL_CAPSULE | Freq: Every day | ORAL | 3 refills | Status: DC

## 2016-07-17 NOTE — Progress Notes (Signed)
Subjective:    Patient ID: Bryan Mccullough, male    DOB: [birth year], 75 y.o.   MRN: [id number]  HPI  06/17/15 Patient is reporting vague abdominal pain.  It is located in the right lower quadrant and the left lower quadrant. It has been there for several years. He is seeing GI who performed a colonoscopy which was significant only for diverticulosis and internal hemorrhoids. He sees a urologist for BPH and has his prostate checked annually. They also check his PSA.  Patient had similar pain dating all the way back to 2011. I checked a CT scan at that time which revealed no abnormalities other than a thick walled bladder and enlarged prostate. The pain has not changed. It occurs on a daily basis. It is a bloating feeling that tends to improve with bowel movements. Patient states that he is not having a bowel movement but only every 3 days. They tend to be hard. He denies any melanoma or hematochezia. He denies any fevers or chills. He denies any nausea or vomiting or bilious emesis. He denies any weight loss. He denies any rash. He does continue have mild constipation. Movement does not exacerbate the pain. At that time,my plan was: Patient's lab work is excellent. I believe the patient's abdominal pain which is chronic may be IBS versus diverticular pain related to chronic constipation. Therefore I will start the patient on linzess 145 mcg poqday and recheck in 2 weeks. 01/14/16 Patient is here today for complete physical exam. He continues to report lower abdominal pain that has been chronic and intermittent for the last 6 years. His last colonoscopy was in 2012. At that time his colonoscopy was significant only for diverticulosis. He has not had any further workup aside from a normal colonoscopy normal lab work normal CT scan and urology referral. However the pain is vague. There is no particular exacerbating or alleviating factors. It tends to be located in the left lower quadrant. Certainly IBS versus  diverticulosis are leading possible causes , differential diagnosis. He also complains of joint pain in his neck, in both shoulders. He has pain with abduction in his left shoulder greater than 90. He has crepitus in his left shoulder. He has pain with internal Rotation of the left shoulder. He has a slightly positive empty can sign his left shoulder. He has a positive Hawkins sign. He has pain with range of motion in the right shoulder.Marland Kitchen He also has crepitus in his cervical spine. He also complains of numbness in his left leg which is chronic for the last 3 years. He has a history of degenerative disc disease with a bulging disc in his lumbar spine. He received 3 cortisone injections for that and then was referred to a neurologist by the orthopedic office. He stopped following up as the symptoms did not improve. He is due for a flu shot today as well as Pneumovax 23.  At that time, my plan was: Lab work is unremarkable. Cholesterol has gone up slightly. I recommended a low fat diet, low saturated fat diet, and decreased consumption of meat, and sweets. I recommended more fresh fruits and vegetables. A believe the pain in his neck and in his shoulders are likely due to osteoarthritis up with possible super spin not as tendinitis in his left shoulder. I recommended trying meloxicam 15 mg by mouth daily and pain is no better in one month's time, I will proceed with x-rays of the shoulders and consider a cortisone injection in the  left shoulder. I believe his chronic lower abdominal pain is likely IBS versus diverticulosis. I'm not able to spend adequate time discussing all of these issues at once and perform a physical today and therefore I have recommended that he follow back up with his gastroenterologist if he continues to have abdominal pain. Apparently, linzess did not help. He denies any chronic constipation, blood in his stool, or chronic diarrhea. Pain has been present now for almost 6 years and therefore I  believe is more likely IBS. He received his flu shot as well as Pneumovax 23 today. His PSA is slightly elevated but this is stable compared to the PSA he had checked at his urologist office in September. He follows up with his urologist later this year. 07/17/16 Patient is here today complaining of abdominal pain. He states that for the last week he has had a burning discomfort in the epigastric area. It seems to improve with ranitidine. He denies any black tarry stools or blood in his stools. He does report some nauseousness. He also reports a lot of bloating and "pressure". He denies any vomiting. He denies any diarrhea. He does have some constipation. When I asked him to clarify he states that he goes to the bathroom one or 2 times a day and has a normal bowel movement. He denies any true constipation. He denies any fevers or chills. He continues to complain of pain in both shoulders left worse than right. He has pain with abduction greater than 90 as well as internal and external rotation. He has a positive empty can sign Past Medical History:  Diagnosis Date  . BPH with elevated PSA   . Diverticulosis   . Erectile dysfunction   . Esophageal stricture   . GERD (gastroesophageal reflux disease)   . Hiatal hernia    Past Surgical History:  Procedure Laterality Date  . PROSTATE SURGERY    Had LUTS in 2002 by Dr. Earlene Plateravis.  Last saw urology in 08/2015 with nml DRE and stable PSA.   Current Outpatient Prescriptions on File Prior to Visit  Medication Sig Dispense Refill  . cholecalciferol (VITAMIN D) 1000 units tablet Take 1,000 Units by mouth daily.    . finasteride (PROSCAR) 5 MG tablet Take 5 mg by mouth daily.    . meloxicam (MOBIC) 15 MG tablet Take 1 tablet (15 mg total) by mouth daily. 30 tablet 1  . tamsulosin (FLOMAX) 0.4 MG CAPS capsule Take by mouth.     No current facility-administered medications on file prior to visit.    Allergies  Allergen Reactions  . Penicillins    Social  History   Social History  . Marital status: Single    Spouse name: N/A  . Number of children: N/A  . Years of education: N/A   Occupational History  . Not on file.   Social History Main Topics  . Smoking status: Never Smoker  . Smokeless tobacco: Never Used  . Alcohol use No  . Drug use: No  . Sexual activity: Yes     Comment: married, bus driver   Other Topics Concern  . Not on file   Social History Narrative  . No narrative on file   Family History  Problem Relation Age of Onset  . Heart disease Brother   . Stroke Brother      Review of Systems  All other systems reviewed and are negative.      Objective:   Physical Exam  Constitutional: He is oriented  to person, place, and time. He appears well-developed and well-nourished. No distress.  HENT:  Head: Normocephalic and atraumatic.  Right Ear: External ear normal.  Left Ear: External ear normal.  Nose: Nose normal.  Mouth/Throat: Oropharynx is clear and moist. No oropharyngeal exudate.  Eyes: Conjunctivae and EOM are normal. Pupils are equal, round, and reactive to light. Right eye exhibits no discharge. Left eye exhibits no discharge. No scleral icterus.  Neck: Normal range of motion. Neck supple. No JVD present. No tracheal deviation present. No thyromegaly present.  Cardiovascular: Normal rate, regular rhythm, normal heart sounds and intact distal pulses.  Exam reveals no gallop and no friction rub.   No murmur heard. Pulmonary/Chest: Effort normal and breath sounds normal. No stridor. No respiratory distress. He has no wheezes. He has no rales. He exhibits no tenderness.  Abdominal: Soft. Bowel sounds are normal. He exhibits no distension and no mass. There is no tenderness. There is no rebound and no guarding.  Musculoskeletal: He exhibits no edema.       Right shoulder: He exhibits decreased range of motion, tenderness and pain.       Left shoulder: He exhibits decreased range of motion, tenderness and  pain.  Lymphadenopathy:    He has no cervical adenopathy.  Neurological: He is alert and oriented to person, place, and time. He has normal reflexes. No cranial nerve deficit. He exhibits normal muscle tone. Coordination normal.  Skin: Skin is warm. No rash noted. He is not diaphoretic. No erythema. No pallor.  Psychiatric: He has a normal mood and affect. His behavior is normal. Judgment and thought content normal.  Vitals reviewed.        Assessment & Plan:   Supraspinatus tendonitis, right  IBS (irritable bowel syndrome) - Plan: Probiotic Product (ALIGN) 4 MG CAPS  Gastroesophageal reflux disease without esophagitis - Plan: esomeprazole (NEXIUM) 40 MG capsule  I believe the patient's abdominal pain is a combination of irritable bowel syndrome coupled with gastroesophageal reflux disease. I recommended that he discontinue meloxicam which is been taking for shoulder. Begin Nexium 40 mg a day. Start a probiotic 1 tablet daily for the bloating and IBS. Recheck in one month. For the patient shoulder pain, I believe he is having super stenotic tendinitis/bursitis. Using sterile technique, I injected the left shoulder with 2 mL of lidocaine, 2 mL of Marcaine, and 2 mL of 40 mg per mL Kenalog. The patient tolerated the procedure well without complication

## 2016-08-19 DIAGNOSIS — Z6822 Body mass index (BMI) 22.0-22.9, adult: Secondary | ICD-10-CM | POA: Diagnosis not present

## 2016-08-19 DIAGNOSIS — Z0000 Encounter for general adult medical examination without abnormal findings: Secondary | ICD-10-CM | POA: Diagnosis not present

## 2016-09-10 ENCOUNTER — Ambulatory Visit (INDEPENDENT_AMBULATORY_CARE_PROVIDER_SITE_OTHER): Payer: Medicare HMO | Admitting: Family Medicine

## 2016-09-10 ENCOUNTER — Encounter: Payer: Self-pay | Admitting: Family Medicine

## 2016-09-10 VITALS — BP 130/80 | HR 78 | Temp 98.2°F | Resp 14 | Wt 144.0 lb

## 2016-09-10 DIAGNOSIS — R1031 Right lower quadrant pain: Secondary | ICD-10-CM

## 2016-09-10 DIAGNOSIS — G8929 Other chronic pain: Secondary | ICD-10-CM | POA: Diagnosis not present

## 2016-09-10 DIAGNOSIS — R1032 Left lower quadrant pain: Secondary | ICD-10-CM | POA: Diagnosis not present

## 2016-09-10 LAB — URINALYSIS, ROUTINE W REFLEX MICROSCOPIC
Bilirubin Urine: NEGATIVE
Glucose, UA: NEGATIVE
KETONES UR: NEGATIVE
LEUKOCYTES UA: NEGATIVE
NITRITE: NEGATIVE
PH: 6 (ref 5.0–8.0)
PROTEIN: NEGATIVE
Specific Gravity, Urine: 1.015 (ref 1.001–1.035)

## 2016-09-10 LAB — URINALYSIS, MICROSCOPIC ONLY
Casts: NONE SEEN [LPF]
Crystals: NONE SEEN [HPF]
WBC UA: NONE SEEN WBC/HPF (ref <=5)
Yeast: NONE SEEN [HPF]

## 2016-09-10 NOTE — Progress Notes (Signed)
Subjective:    Patient ID: Bryan Mccullough, male    DOB: [birth year], 75 y.o.   MRN: [id number]  HPI  06/17/15 Patient is reporting vague abdominal pain.  It is located in the right lower quadrant and the left lower quadrant. It has been there for several years. He is seeing GI who performed a colonoscopy which was significant only for diverticulosis and internal hemorrhoids. He sees a urologist for BPH and has his prostate checked annually. They also check his PSA.  Patient had similar pain dating all the way back to 2011. I checked a CT scan at that time which revealed no abnormalities other than a thick walled bladder and enlarged prostate. The pain has not changed. It occurs on a daily basis. It is a bloating feeling that tends to improve with bowel movements. Patient states that he is not having a bowel movement but only every 3 days. They tend to be hard. He denies any melanoma or hematochezia. He denies any fevers or chills. He denies any nausea or vomiting or bilious emesis. He denies any weight loss. He denies any rash. He does continue have mild constipation. Movement does not exacerbate the pain. At that time,my plan was: Patient's lab work is excellent. I believe the patient's abdominal pain which is chronic may be IBS versus diverticular pain related to chronic constipation. Therefore I will start the patient on linzess 145 mcg poqday and recheck in 2 weeks. 01/14/16 Patient is here today for complete physical exam. He continues to report lower abdominal pain that has been chronic and intermittent for the last 6 years. His last colonoscopy was in 2012. At that time his colonoscopy was significant only for diverticulosis. He has not had any further workup aside from a normal colonoscopy normal lab work normal CT scan and urology referral. However the pain is vague. There is no particular exacerbating or alleviating factors. It tends to be located in the left lower quadrant. Certainly IBS versus  diverticulosis are leading possible causes , differential diagnosis. He also complains of joint pain in his neck, in both shoulders. He has pain with abduction in his left shoulder greater than 90. He has crepitus in his left shoulder. He has pain with internal Rotation of the left shoulder. He has a slightly positive empty can sign his left shoulder. He has a positive Hawkins sign. He has pain with range of motion in the right shoulder.Marland Kitchen He also has crepitus in his cervical spine. He also complains of numbness in his left leg which is chronic for the last 3 years. He has a history of degenerative disc disease with a bulging disc in his lumbar spine. He received 3 cortisone injections for that and then was referred to a neurologist by the orthopedic office. He stopped following up as the symptoms did not improve. He is due for a flu shot today as well as Pneumovax 23.  At that time, my plan was: Lab work is unremarkable. Cholesterol has gone up slightly. I recommended a low fat diet, low saturated fat diet, and decreased consumption of meat, and sweets. I recommended more fresh fruits and vegetables. A believe the pain in his neck and in his shoulders are likely due to osteoarthritis up with possible super spin not as tendinitis in his left shoulder. I recommended trying meloxicam 15 mg by mouth daily and pain is no better in one month's time, I will proceed with x-rays of the shoulders and consider a cortisone injection in the  left shoulder. I believe his chronic lower abdominal pain is likely IBS versus diverticulosis. I'm not able to spend adequate time discussing all of these issues at once and perform a physical today and therefore I have recommended that he follow back up with his gastroenterologist if he continues to have abdominal pain. Apparently, linzess did not help. He denies any chronic constipation, blood in his stool, or chronic diarrhea. Pain has been present now for almost 6 years and therefore I  believe is more likely IBS. He received his flu shot as well as Pneumovax 23 today. His PSA is slightly elevated but this is stable compared to the PSA he had checked at his urologist office in September. He follows up with his urologist later this year. 07/17/16 Patient is here today complaining of abdominal pain. He states that for the last week he has had a burning discomfort in the epigastric area. It seems to improve with ranitidine. He denies any black tarry stools or blood in his stools. He does report some nauseousness. He also reports a lot of bloating and "pressure". He denies any vomiting. He denies any diarrhea. He does have some constipation. When I asked him to clarify he states that he goes to the bathroom one or 2 times a day and has a normal bowel movement. He denies any true constipation. He denies any fevers or chills. He continues to complain of pain in both shoulders left worse than right. He has pain with abduction greater than 90 as well as internal and external rotation. He has a positive empty can sign.  At that time, my plan was: I believe the patient's abdominal pain is a combination of irritable bowel syndrome coupled with gastroesophageal reflux disease. I recommended that he discontinue meloxicam which is been taking for shoulder. Begin Nexium 40 mg a day. Start a probiotic 1 tablet daily for the bloating and IBS. Recheck in one month. For the patient shoulder pain, I believe he is having super stenotic tendinitis/bursitis. Using sterile technique, I injected the left shoulder with 2 mL of lidocaine, 2 mL of Marcaine, and 2 mL of 40 mg per mL Kenalog. The patient tolerated the procedure well without complication  09/11/16 Patient never got the Nexium. He did take a probiotic for one month. He saw no benefit in his abdominal pain. He returns today complaining of abdominal pain. Now it is in the right lower quadrant. It radiates from the right lower quadrant into his back. The pain is  very vague in nature and is very difficult for the patient to describe. He denies any blood in his stools or black tarry stools. He denies any nausea or vomiting or diarrhea. He denies any weight loss. His abdominal pain has been migratory in nature for many years with no abnormal findings on exam. However in August and mushrooms causing his pain. Now he denies any reflux. He denies any heartburn. The pain is no longer epigastric in nature. Past Medical History:  Diagnosis Date  . BPH with elevated PSA   . Diverticulosis   . Erectile dysfunction   . Esophageal stricture   . GERD (gastroesophageal reflux disease)   . Hiatal hernia    Past Surgical History:  Procedure Laterality Date  . PROSTATE SURGERY     Current Outpatient Prescriptions on File Prior to Visit  Medication Sig Dispense Refill  . cholecalciferol (VITAMIN D) 1000 units tablet Take 1,000 Units by mouth daily.    . finasteride (PROSCAR) 5 MG tablet  Take 5 mg by mouth daily.    . Probiotic Product (ALIGN) 4 MG CAPS Take 1 capsule (4 mg total) by mouth daily. 30 capsule 3  . tamsulosin (FLOMAX) 0.4 MG CAPS capsule Take by mouth.    . esomeprazole (NEXIUM) 40 MG capsule Take 1 capsule (40 mg total) by mouth daily. (Patient not taking: Reported on 09/10/2016) 30 capsule 3   No current facility-administered medications on file prior to visit.    Allergies  Allergen Reactions  . Penicillins    Social History   Social History  . Marital status: Single    Spouse name: N/A  . Number of children: N/A  . Years of education: N/A   Occupational History  . Not on file.   Social History Main Topics  . Smoking status: Never Smoker  . Smokeless tobacco: Never Used  . Alcohol use No  . Drug use: No  . Sexual activity: Yes     Comment: married, bus driver   Other Topics Concern  . Not on file   Social History Narrative  . No narrative on file   Family History  Problem Relation Age of Onset  . Heart disease Brother   .  Stroke Brother      Review of Systems  All other systems reviewed and are negative.      Objective:   Physical Exam  Constitutional: He is oriented to person, place, and time. He appears well-developed and well-nourished. No distress.  HENT:  Head: Normocephalic and atraumatic.  Right Ear: External ear normal.  Left Ear: External ear normal.  Nose: Nose normal.  Mouth/Throat: Oropharynx is clear and moist. No oropharyngeal exudate.  Eyes: Conjunctivae and EOM are normal. Pupils are equal, round, and reactive to light. Right eye exhibits no discharge. Left eye exhibits no discharge. No scleral icterus.  Neck: Normal range of motion. Neck supple. No JVD present. No tracheal deviation present. No thyromegaly present.  Cardiovascular: Normal rate, regular rhythm, normal heart sounds and intact distal pulses.  Exam reveals no gallop and no friction rub.   No murmur heard. Pulmonary/Chest: Effort normal and breath sounds normal. No stridor. No respiratory distress. He has no wheezes. He has no rales. He exhibits no tenderness.  Abdominal: Soft. Bowel sounds are normal. He exhibits no distension and no mass. There is no tenderness. There is no rebound and no guarding.  Musculoskeletal: He exhibits no edema.  Lymphadenopathy:    He has no cervical adenopathy.  Neurological: He is alert and oriented to person, place, and time. He has normal reflexes. No cranial nerve deficit. He exhibits normal muscle tone. Coordination normal.  Skin: Skin is warm. No rash noted. He is not diaphoretic. No erythema. No pallor.  Psychiatric: He has a normal mood and affect. His behavior is normal. Judgment and thought content normal.  Vitals reviewed.        Assessment & Plan:   Chronic bilateral lower abdominal pain - Plan: CBC with Differential/Platelet, COMPLETE METABOLIC PANEL WITH GFR, Urinalysis, Routine w reflex microscopic (not at [facility])  Urinalysis shows no specific cause of his right lower  quadrant pain. There is no evidence of urinary tract infection. I will check a CBC and CMP. I will try the patient empirically on Linzess 290 mcg poqday for possible IBS with pain and constipation.  If no better, proceed with CT of abd/pelvis and GI consult.

## 2016-09-22 ENCOUNTER — Ambulatory Visit
Admission: RE | Admit: 2016-09-22 | Discharge: 2016-09-22 | Disposition: A | Discharge status: Home / Self Care | Payer: Medicare HMO | Source: Ambulatory Visit | Attending: Family Medicine | Admitting: Family Medicine

## 2016-09-22 DIAGNOSIS — G8929 Other chronic pain: Secondary | ICD-10-CM

## 2016-09-22 DIAGNOSIS — R1032 Left lower quadrant pain: Principal | ICD-10-CM

## 2016-09-22 DIAGNOSIS — R1031 Right lower quadrant pain: Principal | ICD-10-CM

## 2016-09-22 MED ORDER — IOPAMIDOL (ISOVUE-300) INJECTION 61%
100.0000 mL | Freq: Once | INTRAVENOUS | Status: AC | PRN
  Administered 2016-09-22: 100 mL via INTRAVENOUS

## 2016-09-24 ENCOUNTER — Other Ambulatory Visit: Payer: Self-pay | Admitting: Family Medicine

## 2016-09-24 DIAGNOSIS — R1084 Generalized abdominal pain: Secondary | ICD-10-CM

## 2016-09-25 ENCOUNTER — Encounter: Payer: Self-pay | Admitting: Physician Assistant

## 2016-09-30 DIAGNOSIS — R1031 Right lower quadrant pain: Secondary | ICD-10-CM | POA: Diagnosis not present

## 2016-09-30 DIAGNOSIS — M545 Low back pain: Secondary | ICD-10-CM | POA: Diagnosis not present

## 2016-09-30 DIAGNOSIS — R140 Abdominal distension (gaseous): Secondary | ICD-10-CM | POA: Diagnosis not present

## 2016-09-30 DIAGNOSIS — K588 Other irritable bowel syndrome: Secondary | ICD-10-CM | POA: Diagnosis not present

## 2016-10-07 ENCOUNTER — Ambulatory Visit: Payer: Medicare HMO | Admitting: Physician Assistant

## 2017-02-16 DIAGNOSIS — H524 Presbyopia: Secondary | ICD-10-CM | POA: Diagnosis not present

## 2017-02-16 DIAGNOSIS — Z0100 Encounter for examination of eyes and vision without abnormal findings: Secondary | ICD-10-CM | POA: Diagnosis not present

## 2017-02-16 DIAGNOSIS — H52223 Regular astigmatism, bilateral: Secondary | ICD-10-CM | POA: Diagnosis not present

## 2017-02-16 DIAGNOSIS — H25093 Other age-related incipient cataract, bilateral: Secondary | ICD-10-CM | POA: Diagnosis not present

## 2017-02-16 DIAGNOSIS — H5203 Hypermetropia, bilateral: Secondary | ICD-10-CM | POA: Diagnosis not present

## 2017-06-30 ENCOUNTER — Other Ambulatory Visit: Payer: Medicare HMO

## 2017-06-30 DIAGNOSIS — E782 Mixed hyperlipidemia: Secondary | ICD-10-CM

## 2017-06-30 LAB — CBC WITH DIFFERENTIAL/PLATELET
BASOS PCT: 1 %
Basophils Absolute: 53 cells/uL (ref 0–200)
EOS ABS: 159 {cells}/uL (ref 15–500)
EOS PCT: 3 %
HCT: 46.4 % (ref 38.5–50.0)
HEMOGLOBIN: 16 g/dL (ref 13.0–17.0)
LYMPHS ABS: 2491 {cells}/uL (ref 850–3900)
Lymphocytes Relative: 47 %
MCH: 30 pg (ref 27.0–33.0)
MCHC: 34.5 g/dL (ref 32.0–36.0)
MCV: 87.1 fL (ref 80.0–100.0)
MONOS PCT: 7 %
MPV: 9.6 fL (ref 7.5–12.5)
Monocytes Absolute: 371 cells/uL (ref 200–950)
NEUTROS ABS: 2226 {cells}/uL (ref 1500–7800)
Neutrophils Relative %: 42 %
PLATELETS: 195 10*3/uL (ref 140–400)
RBC: 5.33 MIL/uL (ref 4.20–5.80)
RDW: 13.2 % (ref 11.0–15.0)
WBC: 5.3 10*3/uL (ref 3.8–10.8)

## 2017-06-30 LAB — COMPREHENSIVE METABOLIC PANEL
ALBUMIN: 4.1 g/dL (ref 3.6–5.1)
ALK PHOS: 87 U/L (ref 40–115)
ALT: 18 U/L (ref 9–46)
AST: 24 U/L (ref 10–35)
BUN: 15 mg/dL (ref 7–25)
CO2: 22 mmol/L (ref 20–31)
CREATININE: 0.9 mg/dL (ref 0.70–1.18)
Calcium: 9.3 mg/dL (ref 8.6–10.3)
Chloride: 106 mmol/L (ref 98–110)
Glucose, Bld: 105 mg/dL — ABNORMAL HIGH (ref 70–99)
Potassium: 4.3 mmol/L (ref 3.5–5.3)
SODIUM: 138 mmol/L (ref 135–146)
TOTAL PROTEIN: 6.8 g/dL (ref 6.1–8.1)
Total Bilirubin: 0.5 mg/dL (ref 0.2–1.2)

## 2017-06-30 LAB — LIPID PANEL
CHOLESTEROL: 170 mg/dL (ref <200)
HDL: 31 mg/dL — ABNORMAL LOW (ref >40)
LDL Cholesterol: 91 mg/dL (ref <100)
Total CHOL/HDL Ratio: 5.5 Ratio — ABNORMAL HIGH (ref <5.0)
Triglycerides: 238 mg/dL — ABNORMAL HIGH (ref <150)
VLDL: 48 mg/dL — ABNORMAL HIGH (ref <30)

## 2017-07-05 ENCOUNTER — Ambulatory Visit (INDEPENDENT_AMBULATORY_CARE_PROVIDER_SITE_OTHER): Payer: Medicare HMO | Admitting: Family Medicine

## 2017-07-05 ENCOUNTER — Encounter: Payer: Self-pay | Admitting: Family Medicine

## 2017-07-05 VITALS — BP 130/64 | HR 76 | Temp 98.1°F | Resp 12 | Ht 68.0 in | Wt 144.0 lb

## 2017-07-05 DIAGNOSIS — G8929 Other chronic pain: Secondary | ICD-10-CM

## 2017-07-05 DIAGNOSIS — K581 Irritable bowel syndrome with constipation: Secondary | ICD-10-CM | POA: Diagnosis not present

## 2017-07-05 DIAGNOSIS — R1031 Right lower quadrant pain: Secondary | ICD-10-CM | POA: Diagnosis not present

## 2017-07-05 DIAGNOSIS — R1032 Left lower quadrant pain: Secondary | ICD-10-CM | POA: Diagnosis not present

## 2017-07-05 DIAGNOSIS — Z0000 Encounter for general adult medical examination without abnormal findings: Secondary | ICD-10-CM

## 2017-07-05 NOTE — Progress Notes (Signed)
Subjective:    Patient ID: Bryan Mccullough, male    DOB: 1941-04-14, 76 y.o.   MRN: [id number]  HPI  06/17/15 Patient is reporting vague abdominal pain.  It is located in the right lower quadrant and the left lower quadrant. It has been there for several years. He is seeing GI who performed a colonoscopy which was significant only for diverticulosis and internal hemorrhoids. He sees a urologist for BPH and has his prostate checked annually. They also check his PSA.  Patient had similar pain dating all the way back to 2011. I checked a CT scan at that time which revealed no abnormalities other than a thick walled bladder and enlarged prostate. The pain has not changed. It occurs on a daily basis. It is a bloating feeling that tends to improve with bowel movements. Patient states that he is not having a bowel movement but only every 3 days. They tend to be hard. He denies any melanoma or hematochezia. He denies any fevers or chills. He denies any nausea or vomiting or bilious emesis. He denies any weight loss. He denies any rash. He does continue have mild constipation. Movement does not exacerbate the pain. At that time,my plan was: Patient's lab work is excellent. I believe the patient's abdominal pain which is chronic may be IBS versus diverticular pain related to chronic constipation. Therefore I will start the patient on linzess 145 mcg poqday and recheck in 2 weeks. 01/14/16 Patient is here today for complete physical exam. He continues to report lower abdominal pain that has been chronic and intermittent for the last 6 years. His last colonoscopy was in 2012. At that time his colonoscopy was significant only for diverticulosis. He has not had any further workup aside from a normal colonoscopy normal lab work normal CT scan and urology referral. However the pain is vague. There is no particular exacerbating or alleviating factors. It tends to be located in the left lower quadrant. Certainly IBS versus  diverticulosis are leading possible causes , differential diagnosis. He also complains of joint pain in his neck, in both shoulders. He has pain with abduction in his left shoulder greater than 90. He has crepitus in his left shoulder. He has pain with internal Rotation of the left shoulder. He has a slightly positive empty can sign his left shoulder. He has a positive Hawkins sign. He has pain with range of motion in the right shoulder.Marland Kitchen He also has crepitus in his cervical spine. He also complains of numbness in his left leg which is chronic for the last 3 years. He has a history of degenerative disc disease with a bulging disc in his lumbar spine. He received 3 cortisone injections for that and then was referred to a neurologist by the orthopedic office. He stopped following up as the symptoms did not improve. He is due for a flu shot today as well as Pneumovax 23.  At that time, my plan was: Lab work is unremarkable. Cholesterol has gone up slightly. I recommended a low fat diet, low saturated fat diet, and decreased consumption of meat, and sweets. I recommended more fresh fruits and vegetables. A believe the pain in his neck and in his shoulders are likely due to osteoarthritis up with possible super spin not as tendinitis in his left shoulder. I recommended trying meloxicam 15 mg by mouth daily and pain is no better in one month's time, I will proceed with x-rays of the shoulders and consider a cortisone injection in the  left shoulder. I believe his chronic lower abdominal pain is likely IBS versus diverticulosis. I'm not able to spend adequate time discussing all of these issues at once and perform a physical today and therefore I have recommended that he follow back up with his gastroenterologist if he continues to have abdominal pain. Apparently, linzess did not help. He denies any chronic constipation, blood in his stool, or chronic diarrhea. Pain has been present now for almost 6 years and therefore I  believe is more likely IBS. He received his flu shot as well as Pneumovax 23 today. His PSA is slightly elevated but this is stable compared to the PSA he had checked at his urologist office in September. He follows up with his urologist later this year. 07/17/16 Patient is here today complaining of abdominal pain. He states that for the last week he has had a burning discomfort in the epigastric area. It seems to improve with ranitidine. He denies any black tarry stools or blood in his stools. He does report some nauseousness. He also reports a lot of bloating and "pressure". He denies any vomiting. He denies any diarrhea. He does have some constipation. When I asked him to clarify he states that he goes to the bathroom one or 2 times a day and has a normal bowel movement. He denies any true constipation. He denies any fevers or chills. He continues to complain of pain in both shoulders left worse than right. He has pain with abduction greater than 90 as well as internal and external rotation. He has a positive empty can sign.  At that time, my plan was: I believe the patient's abdominal pain is a combination of irritable bowel syndrome coupled with gastroesophageal reflux disease. I recommended that he discontinue meloxicam which is been taking for shoulder. Begin Nexium 40 mg a day. Start a probiotic 1 tablet daily for the bloating and IBS. Recheck in one month. For the patient shoulder pain, I believe he is having super stenotic tendinitis/bursitis. Using sterile technique, I injected the left shoulder with 2 mL of lidocaine, 2 mL of Marcaine, and 2 mL of 40 mg per mL Kenalog. The patient tolerated the procedure well without complication  17/6/16 Patient never got the Nexium. He did take a probiotic for one month. He saw no benefit in his abdominal pain. He returns today complaining of abdominal pain. Now it is in the right lower quadrant. It radiates from the right lower quadrant into his back. The pain is  very vague in nature and is very difficult for the patient to describe. He denies any blood in his stools or black tarry stools. He denies any nausea or vomiting or diarrhea. He denies any weight loss. His abdominal pain has been migratory in nature for many years with no abnormal findings on exam. However in August and mushrooms causing his pain. Now he denies any reflux. He denies any heartburn. The pain is no longer epigastric in nature.  At that time, my plan was: Urinalysis shows no specific cause of his right lower quadrant pain. There is no evidence of urinary tract infection. I will check a CBC and CMP. I will try the patient empirically on Linzess 290 mcg poqday for possible IBS with pain and constipation.  If no better, proceed with CT of abd/pelvis and GI consult.  07/05/17 CT scan was unremarkable. No cause for the patient's abdominal pain was found. He is here today again complaining of chronic bilateral lower abdominal pain and communication  is always difficult. However he states that when he wakes up in the morning, his stomach rhomboids and makes lots of sounds. During the day, the symptoms will get better and go away. At night, he will report bloating and pressure in his lower abdomen. He does not take the probiotic. He does not take linzess.  He has stopped nexium.  He denies any blood in his stool. He denies any melena. He denies any hematochezia. He is due for a colonoscopy. Lab on 06/30/2017  Component Date Value Ref Range Status  . WBC 06/30/2017 5.3  3.8 - 10.8 K/uL Final  . RBC 06/30/2017 5.33  4.20 - 5.80 MIL/uL Final  . Hemoglobin 06/30/2017 16.0  13.0 - 17.0 g/dL Final  . HCT 06/30/2017 46.4  38.5 - 50.0 % Final  . MCV 06/30/2017 87.1  80.0 - 100.0 fL Final  . MCH 06/30/2017 30.0  27.0 - 33.0 pg Final  . MCHC 06/30/2017 34.5  32.0 - 36.0 g/dL Final  . RDW 06/30/2017 13.2  11.0 - 15.0 % Final  . Platelets 06/30/2017 195  140 - 400 K/uL Final  . MPV 06/30/2017 9.6  7.5 - 12.5  fL Final  . Neutro Abs 06/30/2017 2226  1,500 - 7,800 cells/uL Final  . Lymphs Abs 06/30/2017 2491  850 - 3,900 cells/uL Final  . Monocytes Absolute 06/30/2017 371  200 - 950 cells/uL Final  . Eosinophils Absolute 06/30/2017 159  15 - 500 cells/uL Final  . Basophils Absolute 06/30/2017 53  0 - 200 cells/uL Final  . Neutrophils Relative % 06/30/2017 42  % Final  . Lymphocytes Relative 06/30/2017 47  % Final  . Monocytes Relative 06/30/2017 7  % Final  . Eosinophils Relative 06/30/2017 3  % Final  . Basophils Relative 06/30/2017 1  % Final  . Smear Review 06/30/2017 Criteria for review not met   Final  . Sodium 06/30/2017 138  135 - 146 mmol/L Final  . Potassium 06/30/2017 4.3  3.5 - 5.3 mmol/L Final  . Chloride 06/30/2017 106  98 - 110 mmol/L Final  . CO2 06/30/2017 22  20 - 31 mmol/L Final  . Glucose, Bld 06/30/2017 105* 70 - 99 mg/dL Final  . BUN 06/30/2017 15  7 - 25 mg/dL Final  . Creat 06/30/2017 0.90  0.70 - 1.18 mg/dL Final   Comment:   For patients > or = 74 years of age: The upper reference limit for Creatinine is approximately 13% higher for people identified as African-American.     . Total Bilirubin 06/30/2017 0.5  0.2 - 1.2 mg/dL Final  . Alkaline Phosphatase 06/30/2017 87  40 - 115 U/L Final  . AST 06/30/2017 24  10 - 35 U/L Final  . ALT 06/30/2017 18  9 - 46 U/L Final  . Total Protein 06/30/2017 6.8  6.1 - 8.1 g/dL Final  . Albumin 06/30/2017 4.1  3.6 - 5.1 g/dL Final  . Calcium 06/30/2017 9.3  8.6 - 10.3 mg/dL Final  . Cholesterol 06/30/2017 170  <200 mg/dL Final  . Triglycerides 06/30/2017 238* <150 mg/dL Final  . HDL 06/30/2017 31* >40 mg/dL Final  . Total CHOL/HDL Ratio 06/30/2017 5.5* <5.0 Ratio Final  . VLDL 06/30/2017 48* <30 mg/dL Final  . LDL Cholesterol 06/30/2017 91  <100 mg/dL Final    Past Medical History:  Diagnosis Date  . BPH with elevated PSA   . Diverticulosis   . Erectile dysfunction   . Esophageal stricture   . GERD (gastroesophageal  reflux disease)   . Hiatal hernia    Past Surgical History:  Procedure Laterality Date  . PROSTATE SURGERY     Current Outpatient Prescriptions on File Prior to Visit  Medication Sig Dispense Refill  . cholecalciferol (VITAMIN D) 1000 units tablet Take 1,000 Units by mouth daily.    . finasteride (PROSCAR) 5 MG tablet Take 5 mg by mouth daily.    . tamsulosin (FLOMAX) 0.4 MG CAPS capsule Take by mouth.    . esomeprazole (NEXIUM) 40 MG capsule Take 1 capsule (40 mg total) by mouth daily. (Patient not taking: Reported on 09/10/2016) 30 capsule 3  . Probiotic Product (ALIGN) 4 MG CAPS Take 1 capsule (4 mg total) by mouth daily. (Patient not taking: Reported on 07/05/2017) 30 capsule 3   No current facility-administered medications on file prior to visit.    Allergies  Allergen Reactions  . Penicillins    Social History   Social History  . Marital status: Single    Spouse name: N/A  . Number of children: N/A  . Years of education: N/A   Occupational History  . Not on file.   Social History Main Topics  . Smoking status: Never Smoker  . Smokeless tobacco: Never Used  . Alcohol use No  . Drug use: No  . Sexual activity: Yes     Comment: married, bus driver   Other Topics Concern  . Not on file   Social History Narrative  . No narrative on file   Family History  Problem Relation Age of Onset  . Heart disease Brother   . Stroke Brother      Review of Systems  All other systems reviewed and are negative.      Objective:   Physical Exam  Constitutional: He is oriented to person, place, and time. He appears well-developed and well-nourished. No distress.  HENT:  Head: Normocephalic and atraumatic.  Right Ear: External ear normal.  Left Ear: External ear normal.  Nose: Nose normal.  Mouth/Throat: Oropharynx is clear and moist. No oropharyngeal exudate.  Eyes: Pupils are equal, round, and reactive to light. Conjunctivae and EOM are normal. Right eye exhibits no  discharge. Left eye exhibits no discharge. No scleral icterus.  Neck: Normal range of motion. Neck supple. No JVD present. No tracheal deviation present. No thyromegaly present.  Cardiovascular: Normal rate, regular rhythm, normal heart sounds and intact distal pulses.  Exam reveals no gallop and no friction rub.   No murmur heard. Pulmonary/Chest: Effort normal and breath sounds normal. No stridor. No respiratory distress. He has no wheezes. He has no rales. He exhibits no tenderness.  Abdominal: Soft. Bowel sounds are normal. He exhibits no distension and no mass. There is no tenderness. There is no rebound and no guarding.  Musculoskeletal: He exhibits no edema.  Lymphadenopathy:    He has no cervical adenopathy.  Neurological: He is alert and oriented to person, place, and time. He has normal reflexes. No cranial nerve deficit. He exhibits normal muscle tone. Coordination normal.  Skin: Skin is warm. No rash noted. He is not diaphoretic. No erythema. No pallor.  Psychiatric: He has a normal mood and affect. His behavior is normal. Judgment and thought content normal.  Vitals reviewed.        Assessment & Plan:   GEN MED EXAM IBS-C  Lab work is unremarkable. I did recommend a low carbohydrate diet and increasing aerobic exercise. I believe the patient has irritable bowel syndrome constipation predominant. He states  that he can go days without having a bowel movement. I believe this likely irritates his irritable bowel. I tried to explain to the patient that there is no cure for irritable bowel. The symptoms have been present for years. I have seen the patient several times in the last 5-10 years discussing these exact same symptoms. As I explained to the patient, while there is no cure, we can try to control them as best we can. This can be done through a combination of medicines for constipation, medicines for heartburn, probiotics, and medicines for intestinal spasms. I would like to  start by placing the patient on Linzess 74 mcg poqday and have him be compliant with this for a prolonged period of time to see if the symptoms improve. I believe constipation is an exacerbating factor. I would also, consult GI for colonoscopy. Consider adding probiotics and anti-spasmodics if symptoms do not improve.

## 2017-07-09 DIAGNOSIS — N5201 Erectile dysfunction due to arterial insufficiency: Secondary | ICD-10-CM | POA: Diagnosis not present

## 2017-07-09 DIAGNOSIS — R972 Elevated prostate specific antigen [PSA]: Secondary | ICD-10-CM | POA: Diagnosis not present

## 2017-08-11 ENCOUNTER — Other Ambulatory Visit: Payer: Self-pay | Admitting: Urology

## 2017-08-11 DIAGNOSIS — R972 Elevated prostate specific antigen [PSA]: Secondary | ICD-10-CM

## 2017-08-31 ENCOUNTER — Ambulatory Visit
Admission: RE | Admit: 2017-08-31 | Discharge: 2017-08-31 | Disposition: A | Discharge status: Home / Self Care | Payer: Medicare HMO | Source: Ambulatory Visit | Attending: Urology | Admitting: Urology

## 2017-08-31 DIAGNOSIS — R972 Elevated prostate specific antigen [PSA]: Secondary | ICD-10-CM

## 2017-08-31 MED ORDER — GADOBENATE DIMEGLUMINE 529 MG/ML IV SOLN
13.0000 mL | Freq: Once | INTRAVENOUS | Status: AC | PRN
  Administered 2017-08-31: 13 mL via INTRAVENOUS

## 2017-09-10 ENCOUNTER — Encounter: Payer: Self-pay | Admitting: Family Medicine

## 2018-01-17 DIAGNOSIS — R972 Elevated prostate specific antigen [PSA]: Secondary | ICD-10-CM | POA: Diagnosis not present

## 2018-01-25 DIAGNOSIS — R3912 Poor urinary stream: Secondary | ICD-10-CM | POA: Diagnosis not present

## 2018-01-25 DIAGNOSIS — N401 Enlarged prostate with lower urinary tract symptoms: Secondary | ICD-10-CM | POA: Diagnosis not present

## 2018-01-25 DIAGNOSIS — R350 Frequency of micturition: Secondary | ICD-10-CM | POA: Diagnosis not present

## 2018-01-25 DIAGNOSIS — N5201 Erectile dysfunction due to arterial insufficiency: Secondary | ICD-10-CM | POA: Diagnosis not present

## 2018-01-25 DIAGNOSIS — R972 Elevated prostate specific antigen [PSA]: Secondary | ICD-10-CM | POA: Diagnosis not present

## 2018-06-17 ENCOUNTER — Ambulatory Visit (INDEPENDENT_AMBULATORY_CARE_PROVIDER_SITE_OTHER): Payer: Medicare HMO | Admitting: Family Medicine

## 2018-06-17 ENCOUNTER — Encounter: Payer: Self-pay | Admitting: Family Medicine

## 2018-06-17 VITALS — BP 120/68 | HR 68 | Temp 98.0°F | Resp 12 | Ht 68.0 in | Wt 140.0 lb

## 2018-06-17 DIAGNOSIS — K581 Irritable bowel syndrome with constipation: Secondary | ICD-10-CM

## 2018-06-17 DIAGNOSIS — R1032 Left lower quadrant pain: Secondary | ICD-10-CM

## 2018-06-17 DIAGNOSIS — G8929 Other chronic pain: Secondary | ICD-10-CM | POA: Diagnosis not present

## 2018-06-17 DIAGNOSIS — R1031 Right lower quadrant pain: Secondary | ICD-10-CM

## 2018-06-17 NOTE — Progress Notes (Signed)
Subjective:    Patient ID: Bryan Mccullough, male    DOB: 05/26/1941, 77 y.o.   MRN: [id number]  HPI  06/17/15 Patient is reporting vague abdominal pain.  It is located in the right lower quadrant and the left lower quadrant. It has been there for several years. He is seeing GI who performed a colonoscopy which was significant only for diverticulosis and internal hemorrhoids. He sees a urologist for BPH and has his prostate checked annually. They also check his PSA.  Patient had similar pain dating all the way back to 2011. I checked a CT scan at that time which revealed no abnormalities other than a thick walled bladder and enlarged prostate. The pain has not changed. It occurs on a daily basis. It is a bloating feeling that tends to improve with bowel movements. Patient states that he is not having a bowel movement but only every 3 days. They tend to be hard. He denies any melanoma or hematochezia. He denies any fevers or chills. He denies any nausea or vomiting or bilious emesis. He denies any weight loss. He denies any rash. He does continue have mild constipation. Movement does not exacerbate the pain. At that time,my plan was: Patient's lab work is excellent. I believe the patient's abdominal pain which is chronic may be IBS versus diverticular pain related to chronic constipation. Therefore I will start the patient on linzess 145 mcg poqday and recheck in 2 weeks. 01/14/16 Patient is here today for complete physical exam. He continues to report lower abdominal pain that has been chronic and intermittent for the last 6 years. His last colonoscopy was in 2012. At that time his colonoscopy was significant only for diverticulosis. He has not had any further workup aside from a normal colonoscopy normal lab work normal CT scan and urology referral. However the pain is vague. There is no particular exacerbating or alleviating factors. It tends to be located in the left lower quadrant. Certainly IBS versus  diverticulosis are leading possible causes , differential diagnosis. He also complains of joint pain in his neck, in both shoulders. He has pain with abduction in his left shoulder greater than 90. He has crepitus in his left shoulder. He has pain with internal Rotation of the left shoulder. He has a slightly positive empty can sign his left shoulder. He has a positive Hawkins sign. He has pain with range of motion in the right shoulder.Marland Kitchen. He also has crepitus in his cervical spine. He also complains of numbness in his left leg which is chronic for the last 3 years. He has a history of degenerative disc disease with a bulging disc in his lumbar spine. He received 3 cortisone injections for that and then was referred to a neurologist by the orthopedic office. He stopped following up as the symptoms did not improve. He is due for a flu shot today as well as Pneumovax 23.  At that time, my plan was: Lab work is unremarkable. Cholesterol has gone up slightly. I recommended a low fat diet, low saturated fat diet, and decreased consumption of meat, and sweets. I recommended more fresh fruits and vegetables. A believe the pain in his neck and in his shoulders are likely due to osteoarthritis up with possible super spin not as tendinitis in his left shoulder. I recommended trying meloxicam 15 mg by mouth daily and pain is no better in one month's time, I will proceed with x-rays of the shoulders and consider a cortisone injection in the  left shoulder. I believe his chronic lower abdominal pain is likely IBS versus diverticulosis. I'm not able to spend adequate time discussing all of these issues at once and perform a physical today and therefore I have recommended that he follow back up with his gastroenterologist if he continues to have abdominal pain. Apparently, linzess did not help. He denies any chronic constipation, blood in his stool, or chronic diarrhea. Pain has been present now for almost 6 years and therefore I  believe is more likely IBS. He received his flu shot as well as Pneumovax 23 today. His PSA is slightly elevated but this is stable compared to the PSA he had checked at his urologist office in September. He follows up with his urologist later this year. 07/17/16 Patient is here today complaining of abdominal pain. He states that for the last week he has had a burning discomfort in the epigastric area. It seems to improve with ranitidine. He denies any black tarry stools or blood in his stools. He does report some nauseousness. He also reports a lot of bloating and "pressure". He denies any vomiting. He denies any diarrhea. He does have some constipation. When I asked him to clarify he states that he goes to the bathroom one or 2 times a day and has a normal bowel movement. He denies any true constipation. He denies any fevers or chills. He continues to complain of pain in both shoulders left worse than right. He has pain with abduction greater than 90 as well as internal and external rotation. He has a positive empty can sign.  At that time, my plan was: I believe the patient's abdominal pain is a combination of irritable bowel syndrome coupled with gastroesophageal reflux disease. I recommended that he discontinue meloxicam which is been taking for shoulder. Begin Nexium 40 mg a day. Start a probiotic 1 tablet daily for the bloating and IBS. Recheck in one month. For the patient shoulder pain, I believe he is having super stenotic tendinitis/bursitis. Using sterile technique, I injected the left shoulder with 2 mL of lidocaine, 2 mL of Marcaine, and 2 mL of 40 mg per mL Kenalog. The patient tolerated the procedure well without complication  17/6/16 Patient never got the Nexium. He did take a probiotic for one month. He saw no benefit in his abdominal pain. He returns today complaining of abdominal pain. Now it is in the right lower quadrant. It radiates from the right lower quadrant into his back. The pain is  very vague in nature and is very difficult for the patient to describe. He denies any blood in his stools or black tarry stools. He denies any nausea or vomiting or diarrhea. He denies any weight loss. His abdominal pain has been migratory in nature for many years with no abnormal findings on exam. However in August and mushrooms causing his pain. Now he denies any reflux. He denies any heartburn. The pain is no longer epigastric in nature.  At that time, my plan was: Urinalysis shows no specific cause of his right lower quadrant pain. There is no evidence of urinary tract infection. I will check a CBC and CMP. I will try the patient empirically on Linzess 290 mcg poqday for possible IBS with pain and constipation.  If no better, proceed with CT of abd/pelvis and GI consult.  07/05/17 CT scan was unremarkable. No cause for the patient's abdominal pain was found. He is here today again complaining of chronic bilateral lower abdominal pain and communication  is always difficult. However he states that when he wakes up in the morning, his stomach rumbles and makes lots of sounds. During the day, the symptoms will get better and go away. At night, he will report bloating and pressure in his lower abdomen. He does not take the probiotic. He does not take linzess.  He has stopped nexium.  He denies any blood in his stool. He denies any melena. He denies any hematochezia. He is due for a colonoscopy.  At that time, my plan was: Lab work is unremarkable. I did recommend a low carbohydrate diet and increasing aerobic exercise. I believe the patient has irritable bowel syndrome constipation predominant. He states that he can go days without having a bowel movement. I believe this likely irritates his irritable bowel. I tried to explain to the patient that there is no cure for irritable bowel. The symptoms have been present for years. I have seen the patient several times in the last 5-10 years discussing these exact same  symptoms. As I explained to the patient, while there is no cure, we can try to control them as best we can. This can be done through a combination of medicines for constipation, medicines for heartburn, probiotics, and medicines for intestinal spasms. I would like to start by placing the patient on Linzess 74 mcg poqday and have him be compliant with this for a prolonged period of time to see if the symptoms improve. I believe constipation is an exacerbating factor. I would also, consult GI for colonoscopy. Consider adding probiotics and anti-spasmodics if symptoms do not improve.  06/17/18  Patient did not meet with his gastroenterologist last year.  He quit taking the Linzess after 1 month.  He states that it helps some however he cannot afford it.  At the present time he is only taking Flomax.  He is also taking a probiotic.  He states for the last 2 months, he has had pressure.  The pressure is located in in the right lower quadrant in the left lower quadrant.  It occurs on a daily basis.  He states that it occurs in the mornings and in the evenings.  While he is busy at work or occupied, he does not notice the pain of the pressure.  Defecation helps relieve the pressure and improves the pain.  He states that he also is having some constipation.  He can go 1 to 2 days without having a bowel movement.  He denies any nausea.  He denies any vomiting.  He denies any melena.  He denies any hematochezia.  He denies any hematemesis.  He denies any weight loss.  He denies any fever.  He also reports increased rumbling in his abdomen in the mornings and in the evenings.  I discussed this with the patient and mentioned that these are the same symptoms he has had now for many years.  Patient agrees that the symptoms have not changed.  They seem to be benign functional symptoms that are chronic  Past Medical History:  Diagnosis Date  . BPH with elevated PSA   . Diverticulosis   . Erectile dysfunction   .  Esophageal stricture   . GERD (gastroesophageal reflux disease)   . Hiatal hernia    Past Surgical History:  Procedure Laterality Date  . PROSTATE SURGERY     Current Outpatient Medications on File Prior to Visit  Medication Sig Dispense Refill  . Probiotic Product (ALIGN) 4 MG CAPS Take 1 capsule (4 mg  total) by mouth daily. 30 capsule 3  . tamsulosin (FLOMAX) 0.4 MG CAPS capsule Take by mouth.     No current facility-administered medications on file prior to visit.    Allergies  Allergen Reactions  . Penicillins    Social History   Socioeconomic History  . Marital status: Single    Spouse name: Not on file  . Number of children: Not on file  . Years of education: Not on file  . Highest education level: Not on file  Occupational History  . Not on file  Social Needs  . Financial resource strain: Not on file  . Food insecurity:    Worry: Not on file    Inability: Not on file  . Transportation needs:    Medical: Not on file    Non-medical: Not on file  Tobacco Use  . Smoking status: Never Smoker  . Smokeless tobacco: Never Used  Substance and Sexual Activity  . Alcohol use: No  . Drug use: No  . Sexual activity: Yes    Comment: married, bus driver  Lifestyle  . Physical activity:    Days per week: Not on file    Minutes per session: Not on file  . Stress: Not on file  Relationships  . Social connections:    Talks on phone: Not on file    Gets together: Not on file    Attends religious service: Not on file    Active member of club or organization: Not on file    Attends meetings of clubs or organizations: Not on file    Relationship status: Not on file  . Intimate partner violence:    Fear of current or ex partner: Not on file    Emotionally abused: Not on file    Physically abused: Not on file    Forced sexual activity: Not on file  Other Topics Concern  . Not on file  Social History Narrative  . Not on file   Family History  Problem Relation Age of  Onset  . Heart disease Brother   . Stroke Brother      Review of Systems  All other systems reviewed and are negative.      Objective:   Physical Exam  Constitutional: He is oriented to person, place, and time. He appears well-developed and well-nourished. No distress.  HENT:  Head: Normocephalic and atraumatic.  Right Ear: External ear normal.  Left Ear: External ear normal.  Nose: Nose normal.  Mouth/Throat: Oropharynx is clear and moist. No oropharyngeal exudate.  Eyes: Pupils are equal, round, and reactive to light. Conjunctivae and EOM are normal. Right eye exhibits no discharge. Left eye exhibits no discharge. No scleral icterus.  Neck: Normal range of motion. Neck supple. No JVD present. No tracheal deviation present. No thyromegaly present.  Cardiovascular: Normal rate, regular rhythm, normal heart sounds and intact distal pulses. Exam reveals no gallop and no friction rub.  No murmur heard. Pulmonary/Chest: Effort normal and breath sounds normal. No stridor. No respiratory distress. He has no wheezes. He has no rales. He exhibits no tenderness.  Abdominal: Soft. Bowel sounds are normal. He exhibits no distension and no mass. There is no tenderness. There is no rebound and no guarding.  Musculoskeletal: He exhibits no edema.  Lymphadenopathy:    He has no cervical adenopathy.  Neurological: He is alert and oriented to person, place, and time. He has normal reflexes. No cranial nerve deficit. He exhibits normal muscle tone. Coordination normal.  Skin:  Skin is warm. No rash noted. He is not diaphoretic. No erythema. No pallor.  Psychiatric: He has a normal mood and affect. His behavior is normal. Judgment and thought content normal.  Vitals reviewed.        Assessment & Plan:   IBS-C  I will try the patient on Motegrity 2 mg daily.  I hope that this will help improve his constipation, improve his peristalsis, and relieve his bloating and abdominal pressure.  Reassess  the patient in 2 weeks to see if symptoms have improved.  Consider referral to GI if symptoms are persistent.

## 2018-07-01 ENCOUNTER — Ambulatory Visit (INDEPENDENT_AMBULATORY_CARE_PROVIDER_SITE_OTHER): Payer: Medicare HMO | Admitting: Family Medicine

## 2018-07-01 VITALS — BP 120/68 | HR 70 | Temp 97.7°F | Resp 12 | Ht 68.0 in | Wt 142.0 lb

## 2018-07-01 DIAGNOSIS — R300 Dysuria: Secondary | ICD-10-CM | POA: Diagnosis not present

## 2018-07-01 DIAGNOSIS — K581 Irritable bowel syndrome with constipation: Secondary | ICD-10-CM

## 2018-07-01 LAB — URINALYSIS, ROUTINE W REFLEX MICROSCOPIC
BILIRUBIN URINE: NEGATIVE
GLUCOSE, UA: NEGATIVE
HGB URINE DIPSTICK: NEGATIVE
Ketones, ur: NEGATIVE
LEUKOCYTES UA: NEGATIVE
Nitrite: NEGATIVE
PROTEIN: NEGATIVE
Specific Gravity, Urine: 1.02 (ref 1.001–1.03)
pH: 6.5 (ref 5.0–8.0)

## 2018-07-01 MED ORDER — PRUCALOPRIDE SUCCINATE 2 MG PO TABS: 2.0000 mg | ORAL_TABLET | Freq: Every day | ORAL | 3 refills | Status: DC

## 2018-07-01 NOTE — Progress Notes (Signed)
Subjective:    Patient ID: Bryan Mccullough, male    DOB: 05/26/1941, 77 y.o.   MRN: [id number]  HPI  06/17/15 Patient is reporting vague abdominal pain.  It is located in the right lower quadrant and the left lower quadrant. It has been there for several years. He is seeing GI who performed a colonoscopy which was significant only for diverticulosis and internal hemorrhoids. He sees a urologist for BPH and has his prostate checked annually. They also check his PSA.  Patient had similar pain dating all the way back to 2011. I checked a CT scan at that time which revealed no abnormalities other than a thick walled bladder and enlarged prostate. The pain has not changed. It occurs on a daily basis. It is a bloating feeling that tends to improve with bowel movements. Patient states that he is not having a bowel movement but only every 3 days. They tend to be hard. He denies any melanoma or hematochezia. He denies any fevers or chills. He denies any nausea or vomiting or bilious emesis. He denies any weight loss. He denies any rash. He does continue have mild constipation. Movement does not exacerbate the pain. At that time,my plan was: Patient's lab work is excellent. I believe the patient's abdominal pain which is chronic may be IBS versus diverticular pain related to chronic constipation. Therefore I will start the patient on linzess 145 mcg poqday and recheck in 2 weeks. 01/14/16 Patient is here today for complete physical exam. He continues to report lower abdominal pain that has been chronic and intermittent for the last 6 years. His last colonoscopy was in 2012. At that time his colonoscopy was significant only for diverticulosis. He has not had any further workup aside from a normal colonoscopy normal lab work normal CT scan and urology referral. However the pain is vague. There is no particular exacerbating or alleviating factors. It tends to be located in the left lower quadrant. Certainly IBS versus  diverticulosis are leading possible causes , differential diagnosis. He also complains of joint pain in his neck, in both shoulders. He has pain with abduction in his left shoulder greater than 90. He has crepitus in his left shoulder. He has pain with internal Rotation of the left shoulder. He has a slightly positive empty can sign his left shoulder. He has a positive Hawkins sign. He has pain with range of motion in the right shoulder.Marland Kitchen. He also has crepitus in his cervical spine. He also complains of numbness in his left leg which is chronic for the last 3 years. He has a history of degenerative disc disease with a bulging disc in his lumbar spine. He received 3 cortisone injections for that and then was referred to a neurologist by the orthopedic office. He stopped following up as the symptoms did not improve. He is due for a flu shot today as well as Pneumovax 23.  At that time, my plan was: Lab work is unremarkable. Cholesterol has gone up slightly. I recommended a low fat diet, low saturated fat diet, and decreased consumption of meat, and sweets. I recommended more fresh fruits and vegetables. A believe the pain in his neck and in his shoulders are likely due to osteoarthritis up with possible super spin not as tendinitis in his left shoulder. I recommended trying meloxicam 15 mg by mouth daily and pain is no better in one month's time, I will proceed with x-rays of the shoulders and consider a cortisone injection in the  left shoulder. I believe his chronic lower abdominal pain is likely IBS versus diverticulosis. I'm not able to spend adequate time discussing all of these issues at once and perform a physical today and therefore I have recommended that he follow back up with his gastroenterologist if he continues to have abdominal pain. Apparently, linzess did not help. He denies any chronic constipation, blood in his stool, or chronic diarrhea. Pain has been present now for almost 6 years and therefore I  believe is more likely IBS. He received his flu shot as well as Pneumovax 23 today. His PSA is slightly elevated but this is stable compared to the PSA he had checked at his urologist office in September. He follows up with his urologist later this year. 07/17/16 Patient is here today complaining of abdominal pain. He states that for the last week he has had a burning discomfort in the epigastric area. It seems to improve with ranitidine. He denies any black tarry stools or blood in his stools. He does report some nauseousness. He also reports a lot of bloating and "pressure". He denies any vomiting. He denies any diarrhea. He does have some constipation. When I asked him to clarify he states that he goes to the bathroom one or 2 times a day and has a normal bowel movement. He denies any true constipation. He denies any fevers or chills. He continues to complain of pain in both shoulders left worse than right. He has pain with abduction greater than 90 as well as internal and external rotation. He has a positive empty can sign.  At that time, my plan was: I believe the patient's abdominal pain is a combination of irritable bowel syndrome coupled with gastroesophageal reflux disease. I recommended that he discontinue meloxicam which is been taking for shoulder. Begin Nexium 40 mg a day. Start a probiotic 1 tablet daily for the bloating and IBS. Recheck in one month. For the patient shoulder pain, I believe he is having super stenotic tendinitis/bursitis. Using sterile technique, I injected the left shoulder with 2 mL of lidocaine, 2 mL of Marcaine, and 2 mL of 40 mg per mL Kenalog. The patient tolerated the procedure well without complication  17/6/16 Patient never got the Nexium. He did take a probiotic for one month. He saw no benefit in his abdominal pain. He returns today complaining of abdominal pain. Now it is in the right lower quadrant. It radiates from the right lower quadrant into his back. The pain is  very vague in nature and is very difficult for the patient to describe. He denies any blood in his stools or black tarry stools. He denies any nausea or vomiting or diarrhea. He denies any weight loss. His abdominal pain has been migratory in nature for many years with no abnormal findings on exam. However in August and mushrooms causing his pain. Now he denies any reflux. He denies any heartburn. The pain is no longer epigastric in nature.  At that time, my plan was: Urinalysis shows no specific cause of his right lower quadrant pain. There is no evidence of urinary tract infection. I will check a CBC and CMP. I will try the patient empirically on Linzess 290 mcg poqday for possible IBS with pain and constipation.  If no better, proceed with CT of abd/pelvis and GI consult.  07/05/17 CT scan was unremarkable. No cause for the patient's abdominal pain was found. He is here today again complaining of chronic bilateral lower abdominal pain and communication  is always difficult. However he states that when he wakes up in the morning, his stomach rumbles and makes lots of sounds. During the day, the symptoms will get better and go away. At night, he will report bloating and pressure in his lower abdomen. He does not take the probiotic. He does not take linzess.  He has stopped nexium.  He denies any blood in his stool. He denies any melena. He denies any hematochezia. He is due for a colonoscopy.  At that time, my plan was: Lab work is unremarkable. I did recommend a low carbohydrate diet and increasing aerobic exercise. I believe the patient has irritable bowel syndrome constipation predominant. He states that he can go days without having a bowel movement. I believe this likely irritates his irritable bowel. I tried to explain to the patient that there is no cure for irritable bowel. The symptoms have been present for years. I have seen the patient several times in the last 5-10 years discussing these exact same  symptoms. As I explained to the patient, while there is no cure, we can try to control them as best we can. This can be done through a combination of medicines for constipation, medicines for heartburn, probiotics, and medicines for intestinal spasms. I would like to start by placing the patient on Linzess 74 mcg poqday and have him be compliant with this for a prolonged period of time to see if the symptoms improve. I believe constipation is an exacerbating factor. I would also, consult GI for colonoscopy. Consider adding probiotics and anti-spasmodics if symptoms do not improve.  06/17/18  Patient did not meet with his gastroenterologist last year.  He quit taking the Linzess after 1 month.  He states that it helps some however he cannot afford it.  At the present time he is only taking Flomax.  He is also taking a probiotic.  He states for the last 2 months, he has had pressure.  The pressure is located in in the right lower quadrant in the left lower quadrant.  It occurs on a daily basis.  He states that it occurs in the mornings and in the evenings.  While he is busy at work or occupied, he does not notice the pain of the pressure.  Defecation helps relieve the pressure and improves the pain.  He states that he also is having some constipation.  He can go 1 to 2 days without having a bowel movement.  He denies any nausea.  He denies any vomiting.  He denies any melena.  He denies any hematochezia.  He denies any hematemesis.  He denies any weight loss.  He denies any fever.  He also reports increased rumbling in his abdomen in the mornings and in the evenings.  I discussed this with the patient and mentioned that these are the same symptoms he has had now for many years.  Patient agrees that the symptoms have not changed.  They seem to be benign functional symptoms that are chronic.  At that time, my plan was: I will try the patient on Motegrity 2 mg daily.  I hope that this will help improve his  constipation, improve his peristalsis, and relieve his bloating and abdominal pressure.  Reassess the patient in 2 weeks to see if symptoms have improved.  Consider referral to GI if symptoms are persistent.  07/01/18  Patient believes the motegrity may have helped some.  He is now going to the bathroom every day and having regular  bowel movements.  He continues to endorse a pressure-like sensation in his lower abdomen every morning.  It is particularly worse at night and first thing in the morning.  Once he gets up and begins to move around, the pressure-like discomfort goes away.  Since I last saw him, he does report some mild dysuria and increased urinary frequency and would like to obtain a urinalysis.  I went over the FODMAP diet with the patient.  He does consume cereal, toast, and milk every morning.  Perhaps these carbohydrates are contributing some to his bloating and pressure and heaviness.  His supper, seems more nebulous.  He states that he eats some type of soup almost every night is usually some type of chicken broth.  I do not believe this is playing a role.  However I did provide the patient a handout on this diet and recommended that he avoid foods that are on the fodmap diet. Past Medical History:  Diagnosis Date  . BPH with elevated PSA   . Diverticulosis   . Erectile dysfunction   . Esophageal stricture   . GERD (gastroesophageal reflux disease)   . Hiatal hernia    Past Surgical History:  Procedure Laterality Date  . PROSTATE SURGERY     Current Outpatient Medications on File Prior to Visit  Medication Sig Dispense Refill  . Probiotic Product (ALIGN) 4 MG CAPS Take 1 capsule (4 mg total) by mouth daily. 30 capsule 3  . tamsulosin (FLOMAX) 0.4 MG CAPS capsule Take by mouth.     No current facility-administered medications on file prior to visit.    Allergies  Allergen Reactions  . Penicillins    Social History   Socioeconomic History  . Marital status: Single     Spouse name: Not on file  . Number of children: Not on file  . Years of education: Not on file  . Highest education level: Not on file  Occupational History  . Not on file  Social Needs  . Financial resource strain: Not on file  . Food insecurity:    Worry: Not on file    Inability: Not on file  . Transportation needs:    Medical: Not on file    Non-medical: Not on file  Tobacco Use  . Smoking status: Never Smoker  . Smokeless tobacco: Never Used  Substance and Sexual Activity  . Alcohol use: No  . Drug use: No  . Sexual activity: Yes    Comment: married, bus driver  Lifestyle  . Physical activity:    Days per week: Not on file    Minutes per session: Not on file  . Stress: Not on file  Relationships  . Social connections:    Talks on phone: Not on file    Gets together: Not on file    Attends religious service: Not on file    Active member of club or organization: Not on file    Attends meetings of clubs or organizations: Not on file    Relationship status: Not on file  . Intimate partner violence:    Fear of current or ex partner: Not on file    Emotionally abused: Not on file    Physically abused: Not on file    Forced sexual activity: Not on file  Other Topics Concern  . Not on file  Social History Narrative  . Not on file   Family History  Problem Relation Age of Onset  . Heart disease Brother   .  Stroke Brother      Review of Systems  All other systems reviewed and are negative.      Objective:   Physical Exam  Constitutional: He is oriented to person, place, and time. He appears well-developed and well-nourished. No distress.  HENT:  Head: Normocephalic and atraumatic.  Right Ear: External ear normal.  Left Ear: External ear normal.  Nose: Nose normal.  Mouth/Throat: Oropharynx is clear and moist. No oropharyngeal exudate.  Eyes: Pupils are equal, round, and reactive to light. Conjunctivae and EOM are normal. Right eye exhibits no discharge.  Left eye exhibits no discharge. No scleral icterus.  Neck: Normal range of motion. Neck supple. No JVD present. No tracheal deviation present. No thyromegaly present.  Cardiovascular: Normal rate, regular rhythm, normal heart sounds and intact distal pulses. Exam reveals no gallop and no friction rub.  No murmur heard. Pulmonary/Chest: Effort normal and breath sounds normal. No stridor. No respiratory distress. He has no wheezes. He has no rales. He exhibits no tenderness.  Abdominal: Soft. Bowel sounds are normal. He exhibits no distension and no mass. There is no tenderness. There is no rebound and no guarding.  Musculoskeletal: He exhibits no edema.  Lymphadenopathy:    He has no cervical adenopathy.  Neurological: He is alert and oriented to person, place, and time. He has normal reflexes. No cranial nerve deficit. He exhibits normal muscle tone. Coordination normal.  Skin: Skin is warm. No rash noted. He is not diaphoretic. No erythema. No pallor.  Psychiatric: He has a normal mood and affect. His behavior is normal. Judgment and thought content normal.  Vitals reviewed.        Assessment & Plan:   Irritable bowel syndrome with constipation  Dysuria - Plan: Urinalysis, Routine w reflex microscopic  Urinalysis was unremarkable.  He already has an appointment for later this summer to see his urologist to follow-up his elevated PSA which is in the 5 range.  I see no indication for urinary tract infection.  I believe the patient's symptoms are due to irritable bowel.  He is noticed some improvement on Motegrity.  We will continue this at the present time.  I provided him a handout on FODMAP and have encouraged him to avoid foods rich in these carbohydrates that may contribute to the abdominal bloating and pressure.

## 2018-07-14 ENCOUNTER — Telehealth: Payer: Self-pay | Admitting: Family Medicine

## 2018-07-14 MED ORDER — PRUCALOPRIDE SUCCINATE 2 MG PO TABS: 2.0000 mg | ORAL_TABLET | Freq: Every day | ORAL | 3 refills | Status: DC

## 2018-07-14 NOTE — Telephone Encounter (Signed)
PA Submitted through [email or web address] and received the following:  Your information has been sent to [facility] [employer] [employer]. [employer] has not yet replied to your PA request. You may close this dialog, return to your dashboard, and perform other tasks. To check for an update later, open this request again from your dashboard. If [employer] has not replied to your request within 24 hours please contact [employer] at [phone].

## 2018-07-14 NOTE — Telephone Encounter (Signed)
PA Case: [id number], Status: Approved, Coverage Starts on: 12/05/2017, Coverage Ends on: 12/06/2018. Questions? Contact [phone]. Pharm aware

## 2018-07-18 DIAGNOSIS — R972 Elevated prostate specific antigen [PSA]: Secondary | ICD-10-CM | POA: Diagnosis not present

## 2018-07-25 DIAGNOSIS — R350 Frequency of micturition: Secondary | ICD-10-CM | POA: Diagnosis not present

## 2018-07-25 DIAGNOSIS — N401 Enlarged prostate with lower urinary tract symptoms: Secondary | ICD-10-CM | POA: Diagnosis not present

## 2018-07-25 DIAGNOSIS — R972 Elevated prostate specific antigen [PSA]: Secondary | ICD-10-CM | POA: Diagnosis not present

## 2018-07-25 DIAGNOSIS — N5201 Erectile dysfunction due to arterial insufficiency: Secondary | ICD-10-CM | POA: Diagnosis not present

## 2018-07-28 ENCOUNTER — Telehealth: Payer: Self-pay | Admitting: Family Medicine

## 2018-07-28 NOTE — Telephone Encounter (Signed)
Pt called and lmovm that the last medication you put him on is very expensive and would like something else sent to the pharmacy. (Motegrity)

## 2018-07-29 MED ORDER — LINACLOTIDE 145 MCG PO CAPS: 145.0000 ug | ORAL_CAPSULE | Freq: Every day | ORAL | 3 refills | Status: DC

## 2018-07-29 NOTE — Telephone Encounter (Signed)
Cheapest would be linzess 145 mcg poqday

## 2018-07-29 NOTE — Telephone Encounter (Signed)
Pt aware and med sent to pharm 

## 2018-09-19 ENCOUNTER — Encounter: Payer: Self-pay | Admitting: Family Medicine

## 2018-09-19 ENCOUNTER — Ambulatory Visit (INDEPENDENT_AMBULATORY_CARE_PROVIDER_SITE_OTHER): Payer: Medicare HMO | Admitting: Family Medicine

## 2018-09-19 VITALS — BP 140/68 | HR 80 | Temp 98.1°F | Resp 12 | Ht 68.0 in | Wt 140.0 lb

## 2018-09-19 DIAGNOSIS — M79604 Pain in right leg: Secondary | ICD-10-CM

## 2018-09-19 MED ORDER — PREDNISONE 20 MG PO TABS: ORAL_TABLET | ORAL | 0 refills | Status: DC

## 2018-09-19 MED ORDER — DICYCLOMINE HCL 20 MG PO TABS: 20.0000 mg | ORAL_TABLET | Freq: Four times a day (QID) | ORAL | 1 refills | Status: DC | PRN

## 2018-09-19 NOTE — Progress Notes (Signed)
Subjective:    Patient ID: Bryan Mccullough, male    DOB: July 10, 1941, 77 y.o.   MRN: [id number]  HPI Patient was building a fence in his backyard.  He was lifting 80 pound bags of concrete per his report.  He was also lifting heavy post.  Shortly thereafter he developed pain.  The pain starts in his lower back.  It radiates into his posterior hip.  It also radiates down his leg to his knee.  The pain is described as a deep ache.  He has normal range of motion in the right knee without pain.  There is no erythema or effusion in the right knee.  There is no tenderness to palpation along the joint lines.  There is no laxity to varus or valgus stress.  He has a negative Apley grind test for a meniscal tear.  There is no tenderness to palpation over the greater trochanteric bursa.  He has full internal and external rotation of the right hip without pain.  He has a negative straight leg raise.  There is no pain or tenderness with resisted knee flexion or resisted hip extension to suggest an underlying hamstring tear.  He does have some tenderness to palpation in his lumbar area particularly in the paraspinal muscles.  He has normal reflexes checked at the knee as well as the ankle and normal strength in his right leg.  He denies any bowel or bladder incontinence.  Please see my last note.  Patient is unable to afford motegrity.  He continues to have episodic occasional lower abdominal pain.  This is been an ongoing issue for many years.  Tends to be worse first thing in the morning.  He denies any melena or hematochezia or nausea or vomiting or fever or weight loss. I suspect IBS constipation predominant Past Medical History:  Diagnosis Date  . BPH with elevated PSA   . Diverticulosis   . Erectile dysfunction   . Esophageal stricture   . GERD (gastroesophageal reflux disease)   . Hiatal hernia    Past Surgical History:  Procedure Laterality Date  . PROSTATE SURGERY     Current Outpatient Medications  on File Prior to Visit  Medication Sig Dispense Refill  . finasteride (PROSCAR) 5 MG tablet     . tamsulosin (FLOMAX) 0.4 MG CAPS capsule Take 0.4 mg by mouth daily.      No current facility-administered medications on file prior to visit.    Allergies  Allergen Reactions  . Penicillins    Social History   Socioeconomic History  . Marital status: Single    Spouse name: Not on file  . Number of children: Not on file  . Years of education: Not on file  . Highest education level: Not on file  Occupational History  . Not on file  Social Needs  . Financial resource strain: Not on file  . Food insecurity:    Worry: Not on file    Inability: Not on file  . Transportation needs:    Medical: Not on file    Non-medical: Not on file  Tobacco Use  . Smoking status: Never Smoker  . Smokeless tobacco: Never Used  Substance and Sexual Activity  . Alcohol use: No  . Drug use: No  . Sexual activity: Yes    Comment: married, bus driver  Lifestyle  . Physical activity:    Days per week: Not on file    Minutes per session: Not on file  . Stress:  Not on file  Relationships  . Social connections:    Talks on phone: Not on file    Gets together: Not on file    Attends religious service: Not on file    Active member of club or organization: Not on file    Attends meetings of clubs or organizations: Not on file    Relationship status: Not on file  . Intimate partner violence:    Fear of current or ex partner: Not on file    Emotionally abused: Not on file    Physically abused: Not on file    Forced sexual activity: Not on file  Other Topics Concern  . Not on file  Social History Narrative  . Not on file      Review of Systems  All other systems reviewed and are negative.      Objective:   Physical Exam  Constitutional: He appears well-developed and well-nourished. No distress.  Cardiovascular: Normal rate, regular rhythm, normal heart sounds and intact distal pulses.    Pulmonary/Chest: Effort normal and breath sounds normal. No stridor. No respiratory distress. He has no wheezes.  Musculoskeletal: He exhibits no edema.       Right hip: He exhibits normal range of motion, normal strength, no tenderness, no bony tenderness and no crepitus.       Right knee: He exhibits normal range of motion, no swelling, no effusion, no erythema, no LCL laxity, normal meniscus and no MCL laxity. No medial joint line, no lateral joint line, no MCL and no LCL tenderness noted.       Lumbar back: He exhibits decreased range of motion, tenderness and pain. He exhibits no bony tenderness, no deformity and no spasm.       Back:  Skin: He is not diaphoretic.  Vitals reviewed.         Assessment & Plan:  Right leg pain  I suspect his right leg pain may be referred pain due to lumbar radiculopathy.  I suspect that he may have herniated a disc while working on the fence in his yard.  Begin a prednisone taper pack and reassess in 1 to 2 weeks or sooner if worsening.  There is no symptoms of cauda equina syndrome at present.  Recheck immediately if worsening.  I believe his lower abdominal pain which is chronic and functional is likely due to IBS.  He is unable to afford motegrity.  We will try Bentyl 20 mg every 6 hours as needed for abdominal pain to see if this will help.  We need to watch for worsening BPH given the fact he is taking an anticholinergic now for possible urinary retention.

## 2019-01-10 DIAGNOSIS — R972 Elevated prostate specific antigen [PSA]: Secondary | ICD-10-CM | POA: Diagnosis not present

## 2019-01-17 DIAGNOSIS — R350 Frequency of micturition: Secondary | ICD-10-CM | POA: Diagnosis not present

## 2019-01-17 DIAGNOSIS — N5201 Erectile dysfunction due to arterial insufficiency: Secondary | ICD-10-CM | POA: Diagnosis not present

## 2019-01-17 DIAGNOSIS — N401 Enlarged prostate with lower urinary tract symptoms: Secondary | ICD-10-CM | POA: Diagnosis not present

## 2019-01-17 DIAGNOSIS — R972 Elevated prostate specific antigen [PSA]: Secondary | ICD-10-CM | POA: Diagnosis not present

## 2019-01-25 ENCOUNTER — Other Ambulatory Visit: Payer: Self-pay | Admitting: Family Medicine

## 2019-01-30 ENCOUNTER — Ambulatory Visit (INDEPENDENT_AMBULATORY_CARE_PROVIDER_SITE_OTHER): Payer: Medicare HMO | Admitting: Family Medicine

## 2019-01-30 ENCOUNTER — Encounter: Payer: Self-pay | Admitting: Family Medicine

## 2019-01-30 VITALS — BP 150/70 | HR 78 | Temp 98.2°F | Resp 14 | Ht 68.0 in | Wt 142.0 lb

## 2019-01-30 DIAGNOSIS — M25511 Pain in right shoulder: Secondary | ICD-10-CM | POA: Diagnosis not present

## 2019-01-30 DIAGNOSIS — K219 Gastro-esophageal reflux disease without esophagitis: Secondary | ICD-10-CM | POA: Diagnosis not present

## 2019-01-30 MED ORDER — MELOXICAM 15 MG PO TABS: 15.0000 mg | ORAL_TABLET | Freq: Every day | ORAL | 0 refills | Status: DC

## 2019-01-30 MED ORDER — PANTOPRAZOLE SODIUM 40 MG PO TBEC: 40.0000 mg | DELAYED_RELEASE_TABLET | Freq: Every day | ORAL | 3 refills | Status: DC

## 2019-01-30 NOTE — Progress Notes (Signed)
Subjective:    Patient ID: Bryan Mccullough, male    DOB: 1941-07-18, 78 y.o.   MRN: [id number]  HPI  Patient reports several weeks of pain in his right shoulder.  Patient has pain with abduction greater than 90 degrees.  He is points to the pain starting in the subacromial space and then radiating down to the end of the deltoid muscle.  The pain hurts worse with abduction.  He has pain with empty can sign although his strength is good.  He has pain with Hawkins maneuver.  He has pain with passive range of motion in the shoulder particularly when the shoulder is lifted greater than 100 degrees.  He denies any weakness or numbness or tingling radiating down his right arm.  He denies any pain in his neck.  He has a negative Yergason sign.  He has a negative speeds test.  He also reports a one-month history of heartburn.  He has been taking Rolaids with minimal improvement.  He reports an acid-like sensation in his chest coming up in his mouth.  He reports a burning sensation in his chest made worse with food and when he lies down.  He denies any melena or hematochezia Past Medical History:  Diagnosis Date  . BPH with elevated PSA   . Diverticulosis   . Erectile dysfunction   . Esophageal stricture   . GERD (gastroesophageal reflux disease)   . Hiatal hernia    Past Surgical History:  Procedure Laterality Date  . PROSTATE SURGERY     Current Outpatient Medications on File Prior to Visit  Medication Sig Dispense Refill  . dicyclomine (BENTYL) 20 MG tablet Take 1 tablet (20 mg total) by mouth 4 (four) times daily as needed for spasms (abdominal pain). 30 tablet 1  . finasteride (PROSCAR) 5 MG tablet     . predniSONE (DELTASONE) 20 MG tablet 3 tabs poqday 1-2, 2 tabs poqday 3-4, 1 tab poqday 5-6 12 tablet 0  . tamsulosin (FLOMAX) 0.4 MG CAPS capsule Take 0.4 mg by mouth daily.      No current facility-administered medications on file prior to visit.    Allergies  Allergen Reactions  .  Penicillins    Social History   Socioeconomic History  . Marital status: Single    Spouse name: Not on file  . Number of children: Not on file  . Years of education: Not on file  . Highest education level: Not on file  Occupational History  . Not on file  Social Needs  . Financial resource strain: Not on file  . Food insecurity:    Worry: Not on file    Inability: Not on file  . Transportation needs:    Medical: Not on file    Non-medical: Not on file  Tobacco Use  . Smoking status: Never Smoker  . Smokeless tobacco: Never Used  Substance and Sexual Activity  . Alcohol use: No  . Drug use: No  . Sexual activity: Yes    Comment: married, bus driver  Lifestyle  . Physical activity:    Days per week: Not on file    Minutes per session: Not on file  . Stress: Not on file  Relationships  . Social connections:    Talks on phone: Not on file    Gets together: Not on file    Attends religious service: Not on file    Active member of club or organization: Not on file    Attends meetings of  clubs or organizations: Not on file    Relationship status: Not on file  . Intimate partner violence:    Fear of current or ex partner: Not on file    Emotionally abused: Not on file    Physically abused: Not on file    Forced sexual activity: Not on file  Other Topics Concern  . Not on file  Social History Narrative  . Not on file   Family History  Problem Relation Age of Onset  . Heart disease Brother   . Stroke Brother      Review of Systems  All other systems reviewed and are negative.      Objective:   Physical Exam  Constitutional: He appears well-developed and well-nourished. No distress.  HENT:  Head: Atraumatic.  Cardiovascular: Normal rate, regular rhythm, normal heart sounds and intact distal pulses. Exam reveals no gallop and no friction rub.  No murmur heard. Pulmonary/Chest: Effort normal and breath sounds normal. No respiratory distress. He has no wheezes.  He has no rales. He exhibits no tenderness.  Musculoskeletal:     Right shoulder: He exhibits decreased range of motion, tenderness and pain. He exhibits normal strength.  Skin: He is not diaphoretic.  Vitals reviewed.        Assessment & Plan:   Acute pain of right shoulder  Gastroesophageal reflux disease without esophagitis  I believe the pain in his right shoulder is subacromial bursitis and possibly tendinitis in the supraspinatus muscle.  We discussed a cortisone shot but he is hesitant to try this.  Instead we will use meloxicam 15 mg daily.  Cautioned the patient that this may exacerbate his acid reflux and that he should not use the medication more than 2 weeks.  If the shoulder is not better in 2 weeks I would like to see him back for cortisone injection.  I will treat his reflux with Protonix 40 mg a day.  When I question the patient, he states that his eyes itch however when he takes Allegra it is well controlled.  He denies any problems with his eyes when I discussed it with him despite what he told my nurse.

## 2019-03-21 ENCOUNTER — Other Ambulatory Visit: Payer: Self-pay

## 2019-03-21 ENCOUNTER — Ambulatory Visit (INDEPENDENT_AMBULATORY_CARE_PROVIDER_SITE_OTHER): Payer: Medicare HMO | Admitting: Family Medicine

## 2019-03-21 ENCOUNTER — Encounter: Payer: Self-pay | Admitting: Family Medicine

## 2019-03-21 VITALS — BP 130/80 | HR 66 | Temp 98.1°F | Resp 14 | Ht 68.0 in | Wt 145.0 lb

## 2019-03-21 DIAGNOSIS — M7552 Bursitis of left shoulder: Secondary | ICD-10-CM

## 2019-03-21 DIAGNOSIS — M7551 Bursitis of right shoulder: Secondary | ICD-10-CM

## 2019-03-21 MED ORDER — FLUTICASONE PROPIONATE 50 MCG/ACT NA SUSP: 2.0000 | Freq: Every day | NASAL | 6 refills | Status: DC

## 2019-03-21 NOTE — Progress Notes (Signed)
Subjective:    Patient ID: Bryan Mccullough, male    DOB: [birth year], 78 y.o.   MRN: [id number]  HPI  01/30/19 Patient reports several weeks of pain in his right shoulder.  Patient has pain with abduction greater than 90 degrees.  He is points to the pain starting in the subacromial space and then radiating down to the end of the deltoid muscle.  The pain hurts worse with abduction.  He has pain with empty can sign although his strength is good.  He has pain with Hawkins maneuver.  He has pain with passive range of motion in the shoulder particularly when the shoulder is lifted greater than 100 degrees.  He denies any weakness or numbness or tingling radiating down his right arm.  He denies any pain in his neck.  He has a negative Yergason sign.  He has a negative speeds test.  He also reports a one-month history of heartburn.  He has been taking Rolaids with minimal improvement.  He reports an acid-like sensation in his chest coming up in his mouth.  He reports a burning sensation in his chest made worse with food and when he lies down.  He denies any melena or hematochezia.  At that time, my plan was: I believe the pain in his right shoulder is subacromial bursitis and possibly tendinitis in the supraspinatus muscle.  We discussed a cortisone shot but he is hesitant to try this.  Instead we will use meloxicam 15 mg daily.  Cautioned the patient that this may exacerbate his acid reflux and that he should not use the medication more than 2 weeks.  If the shoulder is not better in 2 weeks I would like to see him back for cortisone injection.  I will treat his reflux with Protonix 40 mg a day.  When I question the patient, he states that his eyes itch however when he takes Allegra it is well controlled.  He denies any problems with his eyes when I discussed it with him despite what he told my nurse.  03/21/19 The pain in his right shoulder got no better.  In fact is gotten worse.  It is keeping him from  sleeping at night.  It aches constantly in the subacromial space in the right shoulder.  It radiates down into the deltoid.  It hurts to abduct his arm now greater than 80 degrees.  Passively he reports pain at 80 degrees.  Actively he reports pain at 80 degrees of abduction.  However he is able to raise his arm up completely over his head.  He has pain with empty can testing but he has 5 out of 5 strength with shoulder abduction.  He is also having pain in his left shoulder.  Pain with abduction against at approximately 90 degrees.  He also has a positive empty can sign on the left side but he has 5 out of 5 strength with abduction.  He has pain with internal and external rotation particularly when the shoulder is abducted to 90 degrees.  He has a negative Spurling sign.  He has a positive drop sign which elicits the pain in both shoulders Past Medical History:  Diagnosis Date  . BPH with elevated PSA   . Diverticulosis   . Erectile dysfunction   . Esophageal stricture   . GERD (gastroesophageal reflux disease)   . Hiatal hernia    Past Surgical History:  Procedure Laterality Date  . PROSTATE SURGERY     Current  Outpatient Medications on File Prior to Visit  Medication Sig Dispense Refill  . dicyclomine (BENTYL) 20 MG tablet Take 1 tablet (20 mg total) by mouth 4 (four) times daily as needed for spasms (abdominal pain). 30 tablet 1  . fexofenadine (ALLEGRA) 180 MG tablet Take 180 mg by mouth daily.    . finasteride (PROSCAR) 5 MG tablet     . meloxicam (MOBIC) 15 MG tablet Take 1 tablet (15 mg total) by mouth daily. 30 tablet 0  . pantoprazole (PROTONIX) 40 MG tablet Take 1 tablet (40 mg total) by mouth daily. 30 tablet 3  . tamsulosin (FLOMAX) 0.4 MG CAPS capsule Take 0.4 mg by mouth daily.      No current facility-administered medications on file prior to visit.    Allergies  Allergen Reactions  . Penicillins    Social History   Socioeconomic History  . Marital status: Single     Spouse name: Not on file  . Number of children: Not on file  . Years of education: Not on file  . Highest education level: Not on file  Occupational History  . Not on file  Social Needs  . Financial resource strain: Not on file  . Food insecurity:    Worry: Not on file    Inability: Not on file  . Transportation needs:    Medical: Not on file    Non-medical: Not on file  Tobacco Use  . Smoking status: Never Smoker  . Smokeless tobacco: Never Used  Substance and Sexual Activity  . Alcohol use: No  . Drug use: No  . Sexual activity: Yes    Comment: married, bus driver  Lifestyle  . Physical activity:    Days per week: Not on file    Minutes per session: Not on file  . Stress: Not on file  Relationships  . Social connections:    Talks on phone: Not on file    Gets together: Not on file    Attends religious service: Not on file    Active member of club or organization: Not on file    Attends meetings of clubs or organizations: Not on file    Relationship status: Not on file  . Intimate partner violence:    Fear of current or ex partner: Not on file    Emotionally abused: Not on file    Physically abused: Not on file    Forced sexual activity: Not on file  Other Topics Concern  . Not on file  Social History Narrative  . Not on file   Family History  Problem Relation Age of Onset  . Heart disease Brother   . Stroke Brother      Review of Systems  All other systems reviewed and are negative.      Objective:   Physical Exam  Constitutional: He appears well-developed and well-nourished. No distress.  HENT:  Head: Atraumatic.  Cardiovascular: Normal rate, regular rhythm, normal heart sounds and intact distal pulses. Exam reveals no gallop and no friction rub.  No murmur heard. Pulmonary/Chest: Effort normal and breath sounds normal. No respiratory distress. He has no wheezes. He has no rales. He exhibits no tenderness.  Musculoskeletal:     Right shoulder: He  exhibits decreased range of motion, tenderness and pain. He exhibits normal strength.     Left shoulder: He exhibits decreased range of motion, tenderness and pain. He exhibits normal strength.  Skin: He is not diaphoretic.  Vitals reviewed.  Assessment & Plan:   Subacromial bursitis of right shoulder joint  Subacromial bursitis of left shoulder joint  Using sterile technique, I injected the right subacromial space with 2 cc lidocaine, 2 cc of Marcaine, and 2 cc of 40 mg/mL Kenalog.  The patient tolerated the procedure well.  Next I recommended doing the same injection to his left shoulder.  Using sterile technique, I injected the left subacromial space with 2 cc lidocaine, 2 cc of Marcaine, and 2 cc of 40 mg/mL Kenalog.  Patient tolerated both injections well without complication.  He also reports that he is had sinus congestion for weeks.  Apparently he has been taking Afrin every day for this.  I have recommended discontinuing Afrin immediately as this could be causing rhinitis medicamentosa.  Instead I recommended replacing with Flonase 2 sprays each nostril daily and trying this over the next week or so to see if the symptoms will improve.

## 2019-04-06 ENCOUNTER — Other Ambulatory Visit: Payer: Self-pay

## 2019-04-06 ENCOUNTER — Ambulatory Visit (INDEPENDENT_AMBULATORY_CARE_PROVIDER_SITE_OTHER): Payer: Medicare HMO | Admitting: Family Medicine

## 2019-04-06 VITALS — BP 140/76 | HR 80 | Temp 98.9°F | Resp 16 | Ht 68.0 in | Wt 137.0 lb

## 2019-04-06 DIAGNOSIS — R7301 Impaired fasting glucose: Secondary | ICD-10-CM | POA: Diagnosis not present

## 2019-04-06 DIAGNOSIS — E785 Hyperlipidemia, unspecified: Secondary | ICD-10-CM | POA: Diagnosis not present

## 2019-04-06 DIAGNOSIS — R3911 Hesitancy of micturition: Secondary | ICD-10-CM | POA: Diagnosis not present

## 2019-04-06 DIAGNOSIS — Z13220 Encounter for screening for lipoid disorders: Secondary | ICD-10-CM

## 2019-04-06 DIAGNOSIS — J0110 Acute frontal sinusitis, unspecified: Secondary | ICD-10-CM

## 2019-04-06 LAB — URINALYSIS, ROUTINE W REFLEX MICROSCOPIC
Bacteria, UA: NONE SEEN /HPF
Bilirubin Urine: NEGATIVE
Glucose, UA: NEGATIVE
Hyaline Cast: NONE SEEN /LPF
Ketones, ur: NEGATIVE
Leukocytes,Ua: NEGATIVE
Nitrite: NEGATIVE
Specific Gravity, Urine: 1.025 (ref 1.001–1.03)
Squamous Epithelial / HPF: NONE SEEN /HPF (ref <=5)
WBC, UA: NONE SEEN /HPF (ref 0–5)
pH: 6 (ref 5.0–8.0)

## 2019-04-06 LAB — MICROSCOPIC MESSAGE

## 2019-04-06 MED ORDER — LEVOFLOXACIN 500 MG PO TABS: 500.0000 mg | ORAL_TABLET | Freq: Every day | ORAL | 0 refills | Status: DC

## 2019-04-06 MED ORDER — PREDNISONE 20 MG PO TABS: ORAL_TABLET | ORAL | 0 refills | Status: DC

## 2019-04-06 NOTE — Progress Notes (Signed)
Subjective:    Patient ID: Bryan DarlingGilberto Mccullough, male    DOB: 1941/05/03, 78 y.o.   MRN: [id number]  HPI  Patient reports a headache for the last 6 days.  The headache is a constant pressure-like pain in his frontal sinus area left greater than right.  It hurts worse if he leans forward such as when he puts on his shoes.  He denies any blurry vision.  He denies any photophobia or phonophobia.  He denies any diplopia.  He denies any nausea or vomiting.  He denies any neurologic deficit.  He denies any facial drooping or slurred speech.  He does have tenderness to percussion over his left frontal sinus.  He denies any rhinorrhea or sneezing or scratchy throat or itchy watery eyes.  He is still using his Flonase every day for sinusitis and allergies.  He denies any head trauma or loss of consciousness.  Cranial nerves II through XII are grossly intact with muscle strength 5/5 equal and symmetric in the upper and lower extremities.  There is no papilledema on funduscopic exam bilaterally.  The remainder of his exam today is completely normal.  He also reports some difficulty urinating.  Ever since he started taking dicyclomine for irritable bowel syndrome he is had increasing difficulty urination.  He does have a history of BPH on Flomax as well as finasteride and is possible that antihistamine is exacerbating that.  He also reports some mild dysuria Past Medical History:  Diagnosis Date  . BPH with elevated PSA   . Diverticulosis   . Erectile dysfunction   . Esophageal stricture   . GERD (gastroesophageal reflux disease)   . Hiatal hernia    Past Surgical History:  Procedure Laterality Date  . PROSTATE SURGERY     Current Outpatient Medications on File Prior to Visit  Medication Sig Dispense Refill  . fexofenadine (ALLEGRA) 180 MG tablet Take 180 mg by mouth daily.    . finasteride (PROSCAR) 5 MG tablet     . fluticasone (FLONASE) 50 MCG/ACT nasal spray Place 2 sprays into both nostrils daily.  (Patient taking differently: Place 1 spray into both nostrils daily. ) 16 g 6  . tamsulosin (FLOMAX) 0.4 MG CAPS capsule Take 0.4 mg by mouth daily.     Marland Kitchen. dicyclomine (BENTYL) 20 MG tablet Take 1 tablet (20 mg total) by mouth 4 (four) times daily as needed for spasms (abdominal pain). (Patient not taking: Reported on 04/06/2019) 30 tablet 1  . meloxicam (MOBIC) 15 MG tablet Take 1 tablet (15 mg total) by mouth daily. (Patient not taking: Reported on 04/06/2019) 30 tablet 0  . pantoprazole (PROTONIX) 40 MG tablet Take 1 tablet (40 mg total) by mouth daily. (Patient not taking: Reported on 04/06/2019) 30 tablet 3   No current facility-administered medications on file prior to visit.    Allergies  Allergen Reactions  . Penicillins    Social History   Socioeconomic History  . Marital status: Single    Spouse name: Not on file  . Number of children: Not on file  . Years of education: Not on file  . Highest education level: Not on file  Occupational History  . Not on file  Social Needs  . Financial resource strain: Not on file  . Food insecurity:    Worry: Not on file    Inability: Not on file  . Transportation needs:    Medical: Not on file    Non-medical: Not on file  Tobacco Use  .  Smoking status: Never Smoker  . Smokeless tobacco: Never Used  Substance and Sexual Activity  . Alcohol use: No  . Drug use: No  . Sexual activity: Yes    Comment: married, bus driver  Lifestyle  . Physical activity:    Days per week: Not on file    Minutes per session: Not on file  . Stress: Not on file  Relationships  . Social connections:    Talks on phone: Not on file    Gets together: Not on file    Attends religious service: Not on file    Active member of club or organization: Not on file    Attends meetings of clubs or organizations: Not on file    Relationship status: Not on file  . Intimate partner violence:    Fear of current or ex partner: Not on file    Emotionally abused: Not on  file    Physically abused: Not on file    Forced sexual activity: Not on file  Other Topics Concern  . Not on file  Social History Narrative  . Not on file   Family History  Problem Relation Age of Onset  . Heart disease Brother   . Stroke Brother      Review of Systems  All other systems reviewed and are negative.      Objective:   Physical Exam  Constitutional: He is oriented to person, place, and time. He appears well-developed and well-nourished. No distress.  HENT:  Head: Normocephalic and atraumatic.  Right Ear: External ear normal.  Left Ear: External ear normal.  Nose: Mucosal edema present. No rhinorrhea. Right sinus exhibits no maxillary sinus tenderness and no frontal sinus tenderness. Left sinus exhibits frontal sinus tenderness. Left sinus exhibits no maxillary sinus tenderness.  Mouth/Throat: Oropharynx is clear and moist. No oropharyngeal exudate.  Eyes: Pupils are equal, round, and reactive to light. Conjunctivae and EOM are normal. Right eye exhibits no discharge. Left eye exhibits no discharge. No scleral icterus.  Neck: Normal range of motion. Neck supple. No JVD present. No tracheal deviation present. No thyromegaly present.  Cardiovascular: Normal rate, regular rhythm, normal heart sounds and intact distal pulses. Exam reveals no gallop and no friction rub.  No murmur heard. Pulmonary/Chest: Effort normal and breath sounds normal. No stridor. No respiratory distress. He has no wheezes. He has no rales. He exhibits no tenderness.  Abdominal: Soft. Bowel sounds are normal. He exhibits no distension and no mass. There is no abdominal tenderness. There is no rebound and no guarding.  Musculoskeletal:        General: No edema.  Lymphadenopathy:    He has no cervical adenopathy.  Neurological: He is alert and oriented to person, place, and time. He has normal reflexes. No cranial nerve deficit. He exhibits normal muscle tone. Coordination normal.  Skin: Skin is  warm. No rash noted. He is not diaphoretic. No erythema. No pallor.  Psychiatric: He has a normal mood and affect. His behavior is normal. Judgment and thought content normal.  Vitals reviewed.        Assessment & Plan:   Screening cholesterol level - Plan: CBC with Differential/Platelet, COMPLETE METABOLIC PANEL WITH GFR, Lipid panel  Acute frontal sinusitis, recurrence not specified - Plan: levofloxacin (LEVAQUIN) 500 MG tablet, predniSONE (DELTASONE) 20 MG tablet  Urinary hesitancy - Plan: Urinalysis, Routine w reflex microscopic Patient would like lab work to check his cholesterol since he has not had lab work in over 2 years.  He would also like to monitor his liver and kidney test as well as his blood sugar.  Therefore I will obtain a CBC, CMP and a fasting lipid panel.  I suspect that his headache is likely sinusitis in the left frontal sinus.  I will start him on Levaquin 500 mg daily for 5 days and a prednisone taper pack.  I think his urinary hesitancy is likely due to his BPH exacerbated by the anticholinergic medicine.  Therefore I recommended that he discontinue Bentyl.  I will check a urinalysis to rule out a urinary tract infection.

## 2019-04-08 LAB — CBC WITH DIFFERENTIAL/PLATELET
Absolute Monocytes: 1003 cells/uL — ABNORMAL HIGH (ref 200–950)
Basophils Absolute: 9 cells/uL (ref 0–200)
Basophils Relative: 0.1 %
Eosinophils Absolute: 18 cells/uL (ref 15–500)
Eosinophils Relative: 0.2 %
HCT: 47 % (ref 38.5–50.0)
Hemoglobin: 16.6 g/dL (ref 13.2–17.1)
Lymphs Abs: 1288 cells/uL (ref 850–3900)
MCH: 30.6 pg (ref 27.0–33.0)
MCHC: 35.3 g/dL (ref 32.0–36.0)
MCV: 86.7 fL (ref 80.0–100.0)
MPV: 10 fL (ref 7.5–12.5)
Monocytes Relative: 10.9 %
Neutro Abs: 6882 cells/uL (ref 1500–7800)
Neutrophils Relative %: 74.8 %
Platelets: 168 10*3/uL (ref 140–400)
RBC: 5.42 10*6/uL (ref 4.20–5.80)
RDW: 12.8 % (ref 11.0–15.0)
Total Lymphocyte: 14 %
WBC: 9.2 10*3/uL (ref 3.8–10.8)

## 2019-04-08 LAB — LIPID PANEL
Cholesterol: 216 mg/dL — ABNORMAL HIGH (ref <200)
HDL: 50 mg/dL (ref >=40)
LDL Cholesterol (Calc): 137 mg/dL (calc) — ABNORMAL HIGH
Non-HDL Cholesterol (Calc): 166 mg/dL (calc) — ABNORMAL HIGH (ref <130)
Total CHOL/HDL Ratio: 4.3 (calc) (ref <5.0)
Triglycerides: 157 mg/dL — ABNORMAL HIGH (ref <150)

## 2019-04-08 LAB — COMPLETE METABOLIC PANEL WITH GFR
AG Ratio: 1.5 (calc) (ref 1.0–2.5)
ALT: 24 U/L (ref 9–46)
AST: 16 U/L (ref 10–35)
Albumin: 4 g/dL (ref 3.6–5.1)
Alkaline phosphatase (APISO): 65 U/L (ref 35–144)
BUN: 25 mg/dL (ref 7–25)
CO2: 29 mmol/L (ref 20–32)
Calcium: 9.1 mg/dL (ref 8.6–10.3)
Chloride: 96 mmol/L — ABNORMAL LOW (ref 98–110)
Creat: 0.99 mg/dL (ref 0.70–1.18)
GFR, Est African American: 85 mL/min/{1.73_m2} (ref >=60)
GFR, Est Non African American: 74 mL/min/{1.73_m2} (ref >=60)
Globulin: 2.7 g/dL (calc) (ref 1.9–3.7)
Glucose, Bld: 151 mg/dL — ABNORMAL HIGH (ref 65–99)
Potassium: 5 mmol/L (ref 3.5–5.3)
Sodium: 134 mmol/L — ABNORMAL LOW (ref 135–146)
Total Bilirubin: 1.4 mg/dL — ABNORMAL HIGH (ref 0.2–1.2)
Total Protein: 6.7 g/dL (ref 6.1–8.1)

## 2019-04-08 LAB — TEST AUTHORIZATION

## 2019-04-08 LAB — HEMOGLOBIN A1C
Hgb A1c MFr Bld: 7.4 % of total Hgb — ABNORMAL HIGH (ref <5.7)
Mean Plasma Glucose: 166 (calc)
eAG (mmol/L): 9.2 (calc)

## 2019-04-10 ENCOUNTER — Encounter: Payer: Self-pay | Admitting: Family Medicine

## 2019-04-10 DIAGNOSIS — E785 Hyperlipidemia, unspecified: Secondary | ICD-10-CM | POA: Insufficient documentation

## 2019-04-10 DIAGNOSIS — E119 Type 2 diabetes mellitus without complications: Secondary | ICD-10-CM | POA: Insufficient documentation

## 2019-04-12 DIAGNOSIS — R972 Elevated prostate specific antigen [PSA]: Secondary | ICD-10-CM | POA: Diagnosis not present

## 2019-04-14 ENCOUNTER — Ambulatory Visit (INDEPENDENT_AMBULATORY_CARE_PROVIDER_SITE_OTHER): Payer: Medicare HMO | Admitting: Family Medicine

## 2019-04-14 ENCOUNTER — Other Ambulatory Visit: Payer: Self-pay

## 2019-04-14 VITALS — BP 122/74 | HR 86 | Temp 98.3°F | Resp 18 | Ht 68.0 in | Wt 136.0 lb

## 2019-04-14 DIAGNOSIS — E7800 Pure hypercholesterolemia, unspecified: Secondary | ICD-10-CM | POA: Diagnosis not present

## 2019-04-14 DIAGNOSIS — E119 Type 2 diabetes mellitus without complications: Secondary | ICD-10-CM

## 2019-04-14 MED ORDER — ATORVASTATIN CALCIUM 20 MG PO TABS: 20.0000 mg | ORAL_TABLET | Freq: Every day | ORAL | 3 refills | Status: DC

## 2019-04-14 MED ORDER — METFORMIN HCL 500 MG PO TABS: 500.0000 mg | ORAL_TABLET | Freq: Two times a day (BID) | ORAL | 3 refills | Status: DC

## 2019-04-14 NOTE — Progress Notes (Signed)
Subjective:    Patient ID: Bryan Mccullough, male    DOB: Sep 07, 1941, 78 y.o.   MRN: [id number]  HPI  04/06/19 Patient reports a headache for the last 6 days.  The headache is a constant pressure-like pain in his frontal sinus area left greater than right.  It hurts worse if he leans forward such as when he puts on his shoes.  He denies any blurry vision.  He denies any photophobia or phonophobia.  He denies any diplopia.  He denies any nausea or vomiting.  He denies any neurologic deficit.  He denies any facial drooping or slurred speech.  He does have tenderness to percussion over his left frontal sinus.  He denies any rhinorrhea or sneezing or scratchy throat or itchy watery eyes.  He is still using his Flonase every day for sinusitis and allergies.  He denies any head trauma or loss of consciousness.  Cranial nerves II through XII are grossly intact with muscle strength 5/5 equal and symmetric in the upper and lower extremities.  There is no papilledema on funduscopic exam bilaterally.  The remainder of his exam today is completely normal.  He also reports some difficulty urinating.  Ever since he started taking dicyclomine for irritable bowel syndrome he is had increasing difficulty urination.  He does have a history of BPH on Flomax as well as finasteride and is possible that antihistamine is exacerbating that.  He also reports some mild dysuria.  At that time, my plan was: Patient would like lab work to check his cholesterol since he has not had lab work in over 2 years.  He would also like to monitor his liver and kidney test as well as his blood sugar.  Therefore I will obtain a CBC, CMP and a fasting lipid panel.  I suspect that his headache is likely sinusitis in the left frontal sinus.  I will start him on Levaquin 500 mg daily for 5 days and a prednisone taper pack.  I think his urinary hesitancy is likely due to his BPH exacerbated by the anticholinergic medicine.  Therefore I recommended that  he discontinue Bentyl.  I will check a urinalysis to rule out a urinary tract infection.  04/14/19 As shown below, sugar was elevated and HgA1c confirms new onset diabetes mellitus 2.  He also has HLD as his goal LDL should be less than 100 as a diabetic.   Office Visit on 04/06/2019  Component Date Value Ref Range Status  . WBC 04/06/2019 9.2  3.8 - 10.8 Thousand/uL Final  . RBC 04/06/2019 5.42  4.20 - 5.80 Million/uL Final  . Hemoglobin 04/06/2019 16.6  13.2 - 17.1 g/dL Final  . HCT 74/14/2395 47.0  38.5 - 50.0 % Final  . MCV 04/06/2019 86.7  80.0 - 100.0 fL Final  . MCH 04/06/2019 30.6  27.0 - 33.0 pg Final  . MCHC 04/06/2019 35.3  32.0 - 36.0 g/dL Final  . RDW 32/01/3342 12.8  11.0 - 15.0 % Final  . Platelets 04/06/2019 168  140 - 400 Thousand/uL Final  . MPV 04/06/2019 10.0  7.5 - 12.5 fL Final  . Neutro Abs 04/06/2019 6,882  1,500 - 7,800 cells/uL Final  . Lymphs Abs 04/06/2019 1,288  850 - 3,900 cells/uL Final  . Absolute Monocytes 04/06/2019 1,003* 200 - 950 cells/uL Final  . Eosinophils Absolute 04/06/2019 18  15 - 500 cells/uL Final  . Basophils Absolute 04/06/2019 9  0 - 200 cells/uL Final  . Neutrophils Relative % 04/06/2019  74.8  % Final  . Total Lymphocyte 04/06/2019 14.0  % Final  . Monocytes Relative 04/06/2019 10.9  % Final  . Eosinophils Relative 04/06/2019 0.2  % Final  . Basophils Relative 04/06/2019 0.1  % Final  . Glucose, Bld 04/06/2019 151* 65 - 99 mg/dL Final   Comment: .            Fasting reference interval . For someone without known diabetes, a glucose value >125 mg/dL indicates that they may have diabetes and this should be confirmed with a follow-up test. .   . BUN 04/06/2019 25  7 - 25 mg/dL Final  . Creat 16/09/9603 0.99  0.70 - 1.18 mg/dL Final   Comment: For patients >65 years of age, the reference limit for Creatinine is approximately 13% higher for people identified as African-American. .   . GFR, Est Non African American 04/06/2019 74  >  OR = 60 mL/min/1.58m2 Final  . GFR, Est African American 04/06/2019 85  > OR = 60 mL/min/1.68m2 Final  . BUN/Creatinine Ratio 04/06/2019 NOT APPLICABLE  6 - 22 (calc) Final  . Sodium 04/06/2019 134* 135 - 146 mmol/L Final  . Potassium 04/06/2019 5.0  3.5 - 5.3 mmol/L Final  . Chloride 04/06/2019 96* 98 - 110 mmol/L Final  . CO2 04/06/2019 29  20 - 32 mmol/L Final  . Calcium 04/06/2019 9.1  8.6 - 10.3 mg/dL Final  . Total Protein 04/06/2019 6.7  6.1 - 8.1 g/dL Final  . Albumin 54/08/8118 4.0  3.6 - 5.1 g/dL Final  . Globulin 14/78/2956 2.7  1.9 - 3.7 g/dL (calc) Final  . AG Ratio 04/06/2019 1.5  1.0 - 2.5 (calc) Final  . Total Bilirubin 04/06/2019 1.4* 0.2 - 1.2 mg/dL Final  . Alkaline phosphatase (APISO) 04/06/2019 65  35 - 144 U/L Final  . AST 04/06/2019 16  10 - 35 U/L Final  . ALT 04/06/2019 24  9 - 46 U/L Final  . Cholesterol 04/06/2019 216* <200 mg/dL Final  . HDL 21/30/8657 50  > OR = 40 mg/dL Final  . Triglycerides 04/06/2019 157* <150 mg/dL Final  . LDL Cholesterol (Calc) 04/06/2019 137* mg/dL (calc) Final   Comment: Reference range: <100 . Desirable range <100 mg/dL for primary prevention;   <70 mg/dL for patients with CHD or diabetic patients  with > or = 2 CHD risk factors. Marland Kitchen LDL-C is now calculated using the Martin-Hopkins  calculation, which is a validated novel method providing  better accuracy than the Friedewald equation in the  estimation of LDL-C.  Horald Pollen et al. Lenox Ahr. 8469;629(52): 2061-2068  (http://education.[facility].com/faq/FAQ164)   . Total CHOL/HDL Ratio 04/06/2019 4.3  <8.4 (calc) Final  . Non-HDL Cholesterol (Calc) 04/06/2019 166* <130 mg/dL (calc) Final   Comment: For patients with diabetes plus 1 major ASCVD risk  factor, treating to a non-HDL-C goal of <100 mg/dL  (LDL-C of <13 mg/dL) is considered a therapeutic  option.   . Color, Urine 04/06/2019 YELLOW  YELLOW Final  . APPearance 04/06/2019 CLEAR  CLEAR Final  . Specific Gravity,  Urine 04/06/2019 1.025  1.001 - 1.03 Final  . pH 04/06/2019 6.0  5.0 - 8.0 Final  . Glucose, UA 04/06/2019 NEGATIVE  NEGATIVE Final  . Bilirubin Urine 04/06/2019 NEGATIVE  NEGATIVE Final  . Ketones, ur 04/06/2019 NEGATIVE  NEGATIVE Final  . Hgb urine dipstick 04/06/2019 TRACE* NEGATIVE Final  . Protein, ur 04/06/2019 TRACE* NEGATIVE Final  . Nitrite 04/06/2019 NEGATIVE  NEGATIVE Final  . Glori Luis 04/06/2019  NEGATIVE  NEGATIVE Final  . WBC, UA 04/06/2019 NONE SEEN  0 - 5 /HPF Final  . RBC / HPF 04/06/2019 0-2  0 - 2 /HPF Final  . Squamous Epithelial / LPF 04/06/2019 NONE SEEN  < OR = 5 /HPF Final  . Bacteria, UA 04/06/2019 NONE SEEN  NONE SEEN /HPF Final  . Hyaline Cast 04/06/2019 NONE SEEN  NONE SEEN /LPF Final  . Note 04/06/2019    Final   Comment: This urine was analyzed for the presence of WBC,  RBC, bacteria, casts, and other formed elements.  Only those elements seen were reported. . .   . Hgb A1c MFr Bld 04/06/2019 7.4* <5.7 % of total Hgb Final   Comment: For someone without known diabetes, a hemoglobin A1c value of 6.5% or greater indicates that they may have  diabetes and this should be confirmed with a follow-up  test. . For someone with known diabetes, a value <7% indicates  that their diabetes is well controlled and a value  greater than or equal to 7% indicates suboptimal  control. A1c targets should be individualized based on  duration of diabetes, age, comorbid conditions, and  other considerations. . Currently, no consensus exists regarding use of hemoglobin A1c for diagnosis of diabetes for children. .   . Mean Plasma Glucose 04/06/2019 166  (calc) Final  . eAG (mmol/L) 04/06/2019 9.2  (calc) Final  . TEST NAME: 04/06/2019 HEMOGLOBIN A1c WITH eAG   Final  . TEST CODE: 04/06/2019 [id number]   Final  . CLIENT CONTACT: 04/06/2019 Delbert Harness   Final  . REPORT ALWAYS MESSAGE SIGNATURE 04/06/2019    Final   Comment: . The laboratory testing on this  patient was verbally requested or confirmed by the ordering physician or his or her authorized representative after contact with an employee of [employer]. Federal regulations require that we maintain on file written authorization for all laboratory testing.  Accordingly we are asking that the ordering physician or his or her authorized representative sign a copy of this report and promptly return it to the client service representative. . . Signature:____________________________________________________ . Please fax this signed page to [phone] or return it via your [employer] courier.     Past Medical History:  Diagnosis Date  . BPH with elevated PSA   . Diabetes mellitus type 2 in nonobese (HCC)   . Diverticulosis   . Erectile dysfunction   . Esophageal stricture   . GERD (gastroesophageal reflux disease)   . Hiatal hernia   . HLD (hyperlipidemia)    Past Surgical History:  Procedure Laterality Date  . PROSTATE SURGERY     Current Outpatient Medications on File Prior to Visit  Medication Sig Dispense Refill  . dicyclomine (BENTYL) 20 MG tablet Take 1 tablet (20 mg total) by mouth 4 (four) times daily as needed for spasms (abdominal pain). (Patient not taking: Reported on 04/06/2019) 30 tablet 1  . fexofenadine (ALLEGRA) 180 MG tablet Take 180 mg by mouth daily.    . finasteride (PROSCAR) 5 MG tablet     . fluticasone (FLONASE) 50 MCG/ACT nasal spray Place 2 sprays into both nostrils daily. (Patient taking differently: Place 1 spray into both nostrils daily. ) 16 g 6  . levofloxacin (LEVAQUIN) 500 MG tablet Take 1 tablet (500 mg total) by mouth daily. 7 tablet 0  . meloxicam (MOBIC) 15 MG tablet Take 1 tablet (15 mg total) by mouth daily. (Patient not taking: Reported on 04/06/2019) 30 tablet 0  .  pantoprazole (PROTONIX) 40 MG tablet Take 1 tablet (40 mg total) by mouth daily. (Patient not taking: Reported on 04/06/2019) 30 tablet 3  . predniSONE (DELTASONE)  20 MG tablet 3 tabs poqday 1-2, 2 tabs poqday 3-4, 1 tab poqday 5-6 12 tablet 0  . tamsulosin (FLOMAX) 0.4 MG CAPS capsule Take 0.4 mg by mouth daily.      No current facility-administered medications on file prior to visit.    Allergies  Allergen Reactions  . Penicillins    Social History   Socioeconomic History  . Marital status: Single    Spouse name: Not on file  . Number of children: Not on file  . Years of education: Not on file  . Highest education level: Not on file  Occupational History  . Not on file  Social Needs  . Financial resource strain: Not on file  . Food insecurity:    Worry: Not on file    Inability: Not on file  . Transportation needs:    Medical: Not on file    Non-medical: Not on file  Tobacco Use  . Smoking status: Never Smoker  . Smokeless tobacco: Never Used  Substance and Sexual Activity  . Alcohol use: No  . Drug use: No  . Sexual activity: Yes    Comment: married, bus driver  Lifestyle  . Physical activity:    Days per week: Not on file    Minutes per session: Not on file  . Stress: Not on file  Relationships  . Social connections:    Talks on phone: Not on file    Gets together: Not on file    Attends religious service: Not on file    Active member of club or organization: Not on file    Attends meetings of clubs or organizations: Not on file    Relationship status: Not on file  . Intimate partner violence:    Fear of current or ex partner: Not on file    Emotionally abused: Not on file    Physically abused: Not on file    Forced sexual activity: Not on file  Other Topics Concern  . Not on file  Social History Narrative  . Not on file   Family History  Problem Relation Age of Onset  . Heart disease Brother   . Stroke Brother      Review of Systems  All other systems reviewed and are negative.      Objective:   Physical Exam  Constitutional: He is oriented to person, place, and time. He appears well-developed and  well-nourished. No distress.  HENT:  Head: Normocephalic and atraumatic.  Right Ear: External ear normal.  Left Ear: External ear normal.  Nose: Mucosal edema present. No rhinorrhea. Right sinus exhibits no maxillary sinus tenderness and no frontal sinus tenderness. Left sinus exhibits frontal sinus tenderness. Left sinus exhibits no maxillary sinus tenderness.  Mouth/Throat: Oropharynx is clear and moist. No oropharyngeal exudate.  Eyes: Pupils are equal, round, and reactive to light. Conjunctivae and EOM are normal. Right eye exhibits no discharge. Left eye exhibits no discharge. No scleral icterus.  Neck: Normal range of motion. Neck supple. No JVD present. No tracheal deviation present. No thyromegaly present.  Cardiovascular: Normal rate, regular rhythm, normal heart sounds and intact distal pulses. Exam reveals no gallop and no friction rub.  No murmur heard. Pulmonary/Chest: Effort normal and breath sounds normal. No stridor. No respiratory distress. He has no wheezes. He has no rales. He exhibits no  tenderness.  Abdominal: Soft. Bowel sounds are normal. He exhibits no distension and no mass. There is no abdominal tenderness. There is no rebound and no guarding.  Musculoskeletal:        General: No edema.  Lymphadenopathy:    He has no cervical adenopathy.  Neurological: He is alert and oriented to person, place, and time. He has normal reflexes. No cranial nerve deficit. He exhibits normal muscle tone. Coordination normal.  Skin: Skin is warm. No rash noted. He is not diaphoretic. No erythema. No pallor.  Psychiatric: He has a normal mood and affect. His behavior is normal. Judgment and thought content normal.  Vitals reviewed.        Assessment & Plan:   Diabetes mellitus type 2 in nonobese (HCC)  Pure hypercholesterolemia  Spent approximately 20 minutes today with the patient reviewing the natural history of diabetes.  I recommended a low carbohydrate diet.  I recommended  less than 45 g of carbs per meal.  Also recommended 30 minutes a day of aerobic exercise.  Meanwhile I would start metformin 500 mg p.o. twice daily as well as Lipitor 20 mg a day to address his hyperlipidemia as well as his hyperglycemia.  Monitor the patient for GI upset on the metformin particular given his history of IBS.  Reassess the patient and his lab work in 3 months

## 2019-04-19 DIAGNOSIS — R972 Elevated prostate specific antigen [PSA]: Secondary | ICD-10-CM | POA: Diagnosis not present

## 2019-04-19 DIAGNOSIS — N401 Enlarged prostate with lower urinary tract symptoms: Secondary | ICD-10-CM | POA: Diagnosis not present

## 2019-04-19 DIAGNOSIS — R350 Frequency of micturition: Secondary | ICD-10-CM | POA: Diagnosis not present

## 2019-07-14 ENCOUNTER — Other Ambulatory Visit: Payer: Self-pay

## 2019-07-17 ENCOUNTER — Other Ambulatory Visit: Payer: Self-pay

## 2019-07-17 ENCOUNTER — Ambulatory Visit (INDEPENDENT_AMBULATORY_CARE_PROVIDER_SITE_OTHER): Payer: Medicare HMO | Admitting: Family Medicine

## 2019-07-17 ENCOUNTER — Encounter: Payer: Self-pay | Admitting: Family Medicine

## 2019-07-17 VITALS — BP 126/70 | HR 74 | Temp 98.6°F | Resp 16 | Ht 68.0 in | Wt 134.0 lb

## 2019-07-17 DIAGNOSIS — E7800 Pure hypercholesterolemia, unspecified: Secondary | ICD-10-CM | POA: Diagnosis not present

## 2019-07-17 DIAGNOSIS — E119 Type 2 diabetes mellitus without complications: Secondary | ICD-10-CM | POA: Diagnosis not present

## 2019-07-17 DIAGNOSIS — M2550 Pain in unspecified joint: Secondary | ICD-10-CM

## 2019-07-17 NOTE — Progress Notes (Signed)
Subjective:    Patient ID: Bryan Mccullough, male    DOB: [birth year], 78 y.o.   MRN: [id number]  HPI  04/06/19 Patient reports a headache for the last 6 days.  The headache is a constant pressure-like pain in his frontal sinus area left greater than right.  It hurts worse if he leans forward such as when he puts on his shoes.  He denies any blurry vision.  He denies any photophobia or phonophobia.  He denies any diplopia.  He denies any nausea or vomiting.  He denies any neurologic deficit.  He denies any facial drooping or slurred speech.  He does have tenderness to percussion over his left frontal sinus.  He denies any rhinorrhea or sneezing or scratchy throat or itchy watery eyes.  He is still using his Flonase every day for sinusitis and allergies.  He denies any head trauma or loss of consciousness.  Cranial nerves II through XII are grossly intact with muscle strength 5/5 equal and symmetric in the upper and lower extremities.  There is no papilledema on funduscopic exam bilaterally.  The remainder of his exam today is completely normal.  He also reports some difficulty urinating.  Ever since he started taking dicyclomine for irritable bowel syndrome he is had increasing difficulty urination.  He does have a history of BPH on Flomax as well as finasteride and is possible that antihistamine is exacerbating that.  He also reports some mild dysuria.  At that time, my plan was: Patient would like lab work to check his cholesterol since he has not had lab work in over 2 years.  He would also like to monitor his liver and kidney test as well as his blood sugar.  Therefore I will obtain a CBC, CMP and a fasting lipid panel.  I suspect that his headache is likely sinusitis in the left frontal sinus.  I will start him on Levaquin 500 mg daily for 5 days and a prednisone taper pack.  I think his urinary hesitancy is likely due to his BPH exacerbated by the anticholinergic medicine.  Therefore I recommended that  he discontinue Bentyl.  I will check a urinalysis to rule out a urinary tract infection.  04/14/19 Sugar was elevated and HgA1c confirms new onset diabetes mellitus 2.  He also has HLD as his goal LDL should be less than 100 as a diabetic.  At that time, my plan was: Spent approximately 20 minutes today with the patient reviewing the natural history of diabetes.  I recommended a low carbohydrate diet.  I recommended less than 45 g of carbs per meal.  Also recommended 30 minutes a day of aerobic exercise.  Meanwhile I would start metformin 500 mg p.o. twice daily as well as Lipitor 20 mg a day to address his hyperlipidemia as well as his hyperglycemia.  Monitor the patient for GI upset on the metformin particular given his history of IBS.  Reassess the patient and his lab work in 3 months  07/17/19 Here for follow up.  Patient reports diffuse body aches over the last 4 weeks.  He states that his shoulders hurt and ache constantly.  He feels weak in his upper extremities.  He also complains of weakness in his proximal thigh muscles as well as pain in his knees.  He denies any fevers or chills or rash.  Examination of his fingernails does show some horizontal pitting particularly on his left hand but he has no history of psoriasis that he is  aware of.  There is no similar pitting on his right hand.  He denies any recent tick bites.  He did recently start Lipitor and I question if this could be statin induced myopathy.  He denies any nausea or vomiting or diarrhea on metformin.  He is not regularly checking his blood sugars but he denies any polyuria, polydipsia, or blurry vision.  He denies hypoglycemic episodes.  Past Medical History:  Diagnosis Date  . BPH with elevated PSA   . Diabetes mellitus type 2 in nonobese (HCC)   . Diverticulosis   . Erectile dysfunction   . Esophageal stricture   . GERD (gastroesophageal reflux disease)   . Hiatal hernia   . HLD (hyperlipidemia)    Past Surgical History:   Procedure Laterality Date  . PROSTATE SURGERY     Current Outpatient Medications on File Prior to Visit  Medication Sig Dispense Refill  . atorvastatin (LIPITOR) 20 MG tablet Take 1 tablet (20 mg total) by mouth daily. 90 tablet 3  . fexofenadine (ALLEGRA) 180 MG tablet Take 180 mg by mouth daily.    . finasteride (PROSCAR) 5 MG tablet     . fluticasone (FLONASE) 50 MCG/ACT nasal spray Place 2 sprays into both nostrils daily. (Patient taking differently: Place 1 spray into both nostrils daily. ) 16 g 6  . metFORMIN (GLUCOPHAGE) 500 MG tablet Take 1 tablet (500 mg total) by mouth 2 (two) times daily with a meal. 180 tablet 3  . tamsulosin (FLOMAX) 0.4 MG CAPS capsule Take 0.4 mg by mouth daily.      No current facility-administered medications on file prior to visit.     Allergies  Allergen Reactions  . Penicillins    Social History   Socioeconomic History  . Marital status: Single    Spouse name: Not on file  . Number of children: Not on file  . Years of education: Not on file  . Highest education level: Not on file  Occupational History  . Not on file  Social Needs  . Financial resource strain: Not on file  . Food insecurity    Worry: Not on file    Inability: Not on file  . Transportation needs    Medical: Not on file    Non-medical: Not on file  Tobacco Use  . Smoking status: Never Smoker  . Smokeless tobacco: Never Used  Substance and Sexual Activity  . Alcohol use: No  . Drug use: No  . Sexual activity: Yes    Comment: married, bus driver  Lifestyle  . Physical activity    Days per week: Not on file    Minutes per session: Not on file  . Stress: Not on file  Relationships  . Social Musicianconnections    Talks on phone: Not on file    Gets together: Not on file    Attends religious service: Not on file    Active member of club or organization: Not on file    Attends meetings of clubs or organizations: Not on file    Relationship status: Not on file  . Intimate  partner violence    Fear of current or ex partner: Not on file    Emotionally abused: Not on file    Physically abused: Not on file    Forced sexual activity: Not on file  Other Topics Concern  . Not on file  Social History Narrative  . Not on file   Family History  Problem Relation Age of Onset  .  Heart disease Brother   . Stroke Brother      Review of Systems  All other systems reviewed and are negative.      Objective:   Physical Exam  Constitutional: He is oriented to person, place, and time. He appears well-developed and well-nourished. No distress.  HENT:  Head: Normocephalic and atraumatic.  Neck: Normal range of motion. Neck supple. No thyromegaly present.  Cardiovascular: Normal rate, regular rhythm, normal heart sounds and intact distal pulses. Exam reveals no gallop and no friction rub.  No murmur heard. Pulmonary/Chest: Effort normal and breath sounds normal. No respiratory distress. He has no wheezes. He has no rales. He exhibits no tenderness.  Abdominal: Soft. Bowel sounds are normal.  Musculoskeletal:        General: No edema.  Neurological: He is alert and oriented to person, place, and time. He has normal reflexes. No cranial nerve deficit. He exhibits normal muscle tone. Coordination normal.  Skin: Skin is warm. No rash noted. He is not diaphoretic. No erythema. No pallor.  Vitals reviewed.        Assessment & Plan:   1. Diabetes mellitus type 2 in nonobese (HCC) Check hemoglobin A1c.  Goal hemoglobin A1c is less than 6.5.  If hemoglobin A1c is less than 6.5, I will make no additional changes in his metformin. - Hemoglobin A1c - CBC with Differential/Platelet - COMPLETE METABOLIC PANEL WITH GFR - Lipid panel - Microalbumin, urine  2. Pure hypercholesterolemia Check fasting lipid panel.  Goal LDL cholesterol is less than 100. - CBC with Differential/Platelet - COMPLETE METABOLIC PANEL WITH GFR - Lipid panel  3. Polyarthralgia I am concerned  about statin induced myopathy although polymyalgia rheumatica and even possibly psoriatic arthritis would be on the differential diagnosis.  Discontinue Lipitor and then reassess for improvement over the next 1 to 2 weeks.  Check a sedimentation rate and if elevated consider PMR versus psoriatic arthritis. - Sedimentation rate - CK

## 2019-07-18 LAB — COMPLETE METABOLIC PANEL WITH GFR
AG Ratio: 1.7 (calc) (ref 1.0–2.5)
ALT: 21 U/L (ref 9–46)
AST: 22 U/L (ref 10–35)
Albumin: 4.2 g/dL (ref 3.6–5.1)
Alkaline phosphatase (APISO): 85 U/L (ref 35–144)
BUN: 24 mg/dL (ref 7–25)
CO2: 26 mmol/L (ref 20–32)
Calcium: 9.3 mg/dL (ref 8.6–10.3)
Chloride: 107 mmol/L (ref 98–110)
Creat: 0.82 mg/dL (ref 0.70–1.18)
GFR, Est African American: 100 mL/min/{1.73_m2} (ref >=60)
GFR, Est Non African American: 86 mL/min/{1.73_m2} (ref >=60)
Globulin: 2.5 g/dL (calc) (ref 1.9–3.7)
Glucose, Bld: 116 mg/dL — ABNORMAL HIGH (ref 65–99)
Potassium: 4.2 mmol/L (ref 3.5–5.3)
Sodium: 142 mmol/L (ref 135–146)
Total Bilirubin: 0.6 mg/dL (ref 0.2–1.2)
Total Protein: 6.7 g/dL (ref 6.1–8.1)

## 2019-07-18 LAB — MICROALBUMIN, URINE: Microalb, Ur: 1.1 mg/dL

## 2019-07-18 LAB — HEMOGLOBIN A1C
Hgb A1c MFr Bld: 6 % of total Hgb — ABNORMAL HIGH (ref <5.7)
Mean Plasma Glucose: 126 (calc)
eAG (mmol/L): 7 (calc)

## 2019-07-18 LAB — LIPID PANEL
Cholesterol: 89 mg/dL (ref <200)
HDL: 35 mg/dL — ABNORMAL LOW (ref >=40)
LDL Cholesterol (Calc): 37 mg/dL (calc)
Non-HDL Cholesterol (Calc): 54 mg/dL (calc) (ref <130)
Total CHOL/HDL Ratio: 2.5 (calc) (ref <5.0)
Triglycerides: 87 mg/dL (ref <150)

## 2019-07-18 LAB — CBC WITH DIFFERENTIAL/PLATELET
Absolute Monocytes: 573 cells/uL (ref 200–950)
Basophils Absolute: 48 cells/uL (ref 0–200)
Basophils Relative: 0.7 %
Eosinophils Absolute: 228 cells/uL (ref 15–500)
Eosinophils Relative: 3.3 %
HCT: 43.4 % (ref 38.5–50.0)
Hemoglobin: 14.6 g/dL (ref 13.2–17.1)
Lymphs Abs: 1684 cells/uL (ref 850–3900)
MCH: 30.5 pg (ref 27.0–33.0)
MCHC: 33.6 g/dL (ref 32.0–36.0)
MCV: 90.6 fL (ref 80.0–100.0)
MPV: 10.4 fL (ref 7.5–12.5)
Monocytes Relative: 8.3 %
Neutro Abs: 4368 cells/uL (ref 1500–7800)
Neutrophils Relative %: 63.3 %
Platelets: 187 10*3/uL (ref 140–400)
RBC: 4.79 10*6/uL (ref 4.20–5.80)
RDW: 11.8 % (ref 11.0–15.0)
Total Lymphocyte: 24.4 %
WBC: 6.9 10*3/uL (ref 3.8–10.8)

## 2019-07-18 LAB — CK: Total CK: 81 U/L (ref 44–196)

## 2019-07-18 LAB — SEDIMENTATION RATE: Sed Rate: 2 mm/h (ref 0–20)

## 2019-08-01 ENCOUNTER — Other Ambulatory Visit: Payer: Self-pay

## 2019-08-01 ENCOUNTER — Ambulatory Visit (INDEPENDENT_AMBULATORY_CARE_PROVIDER_SITE_OTHER): Payer: Medicare HMO | Admitting: Family Medicine

## 2019-08-01 ENCOUNTER — Encounter: Payer: Self-pay | Admitting: Family Medicine

## 2019-08-01 VITALS — BP 120/60 | HR 90 | Temp 98.8°F | Resp 16 | Ht 68.0 in | Wt 134.0 lb

## 2019-08-01 DIAGNOSIS — Z23 Encounter for immunization: Secondary | ICD-10-CM

## 2019-08-01 DIAGNOSIS — M2550 Pain in unspecified joint: Secondary | ICD-10-CM

## 2019-08-01 MED ORDER — PANTOPRAZOLE SODIUM 40 MG PO TBEC: 40.0000 mg | DELAYED_RELEASE_TABLET | Freq: Every day | ORAL | 3 refills | Status: DC

## 2019-08-01 MED ORDER — PRAVASTATIN SODIUM 20 MG PO TABS: 20.0000 mg | ORAL_TABLET | Freq: Every day | ORAL | 3 refills | Status: DC

## 2019-08-01 MED ORDER — DICLOFENAC SODIUM 75 MG PO TBEC: 75.0000 mg | DELAYED_RELEASE_TABLET | Freq: Two times a day (BID) | ORAL | 3 refills | Status: DC

## 2019-08-01 NOTE — Progress Notes (Signed)
Subjective:    Patient ID: Bryan Mccullough, male    DOB: [birth year], 78 y.o.   MRN: [id number]  HPI  04/06/19 Patient reports a headache for the last 6 days.  The headache is a constant pressure-like pain in his frontal sinus area left greater than right.  It hurts worse if he leans forward such as when he puts on his shoes.  He denies any blurry vision.  He denies any photophobia or phonophobia.  He denies any diplopia.  He denies any nausea or vomiting.  He denies any neurologic deficit.  He denies any facial drooping or slurred speech.  He does have tenderness to percussion over his left frontal sinus.  He denies any rhinorrhea or sneezing or scratchy throat or itchy watery eyes.  He is still using his Flonase every day for sinusitis and allergies.  He denies any head trauma or loss of consciousness.  Cranial nerves II through XII are grossly intact with muscle strength 5/5 equal and symmetric in the upper and lower extremities.  There is no papilledema on funduscopic exam bilaterally.  The remainder of his exam today is completely normal.  He also reports some difficulty urinating.  Ever since he started taking dicyclomine for irritable bowel syndrome he is had increasing difficulty urination.  He does have a history of BPH on Flomax as well as finasteride and is possible that antihistamine is exacerbating that.  He also reports some mild dysuria.  At that time, my plan was: Patient would like lab work to check his cholesterol since he has not had lab work in over 2 years.  He would also like to monitor his liver and kidney test as well as his blood sugar.  Therefore I will obtain a CBC, CMP and a fasting lipid panel.  I suspect that his headache is likely sinusitis in the left frontal sinus.  I will start him on Levaquin 500 mg daily for 5 days and a prednisone taper pack.  I think his urinary hesitancy is likely due to his BPH exacerbated by the anticholinergic medicine.  Therefore I recommended that  he discontinue Bentyl.  I will check a urinalysis to rule out a urinary tract infection.  04/14/19 Sugar was elevated and HgA1c confirms new onset diabetes mellitus 2.  He also has HLD as his goal LDL should be less than 100 as a diabetic.  At that time, my plan was: Spent approximately 20 minutes today with the patient reviewing the natural history of diabetes.  I recommended a low carbohydrate diet.  I recommended less than 45 g of carbs per meal.  Also recommended 30 minutes a day of aerobic exercise.  Meanwhile I would start metformin 500 mg p.o. twice daily as well as Lipitor 20 mg a day to address his hyperlipidemia as well as his hyperglycemia.  Monitor the patient for GI upset on the metformin particular given his history of IBS.  Reassess the patient and his lab work in 3 months  07/17/19 Here for follow up.  Patient reports diffuse body aches over the last 4 weeks.  He states that his shoulders hurt and ache constantly.  He feels weak in his upper extremities.  He also complains of weakness in his proximal thigh muscles as well as pain in his knees.  He denies any fevers or chills or rash.  Examination of his fingernails does show some horizontal pitting particularly on his left hand but he has no history of psoriasis that he is  aware of.  There is no similar pitting on his right hand.  He denies any recent tick bites.  He did recently start Lipitor and I question if this could be statin induced myopathy.  He denies any nausea or vomiting or diarrhea on metformin.  He is not regularly checking his blood sugars but he denies any polyuria, polydipsia, or blurry vision.  He denies hypoglycemic episodes.  At that time, my plan was: 1. Diabetes mellitus type 2 in nonobese (HCC) Check hemoglobin A1c.  Goal hemoglobin A1c is less than 6.5.  If hemoglobin A1c is less than 6.5, I will make no additional changes in his metformin. - Hemoglobin A1c - CBC with Differential/Platelet - COMPLETE METABOLIC PANEL  WITH GFR - Lipid panel - Microalbumin, urine  2. Pure hypercholesterolemia Check fasting lipid panel.  Goal LDL cholesterol is less than 100. - CBC with Differential/Platelet - COMPLETE METABOLIC PANEL WITH GFR - Lipid panel  3. Polyarthralgia I am concerned about statin induced myopathy although polymyalgia rheumatica and even possibly psoriatic arthritis would be on the differential diagnosis.  Discontinue Lipitor and then reassess for improvement over the next 1 to 2 weeks.  Check a sedimentation rate and if elevated consider PMR versus psoriatic arthritis. - Sedimentation rate - CK No visits with results within 2 Week(s) from this visit.  Latest known visit with results is:  Office Visit on 07/17/2019  Component Date Value Ref Range Status   Hgb A1c MFr Bld 07/17/2019 6.0* <5.7 % of total Hgb Final   Comment: For someone without known diabetes, a hemoglobin  A1c value between 5.7% and 6.4% is consistent with prediabetes and should be confirmed with a  follow-up test. . For someone with known diabetes, a value <7% indicates that their diabetes is well controlled. A1c targets should be individualized based on duration of diabetes, age, comorbid conditions, and other considerations. . This assay result is consistent with an increased risk of diabetes. . Currently, no consensus exists regarding use of hemoglobin A1c for diagnosis of diabetes for children. .    Mean Plasma Glucose 07/17/2019 126  (calc) Final   eAG (mmol/L) 07/17/2019 7.0  (calc) Final   WBC 07/17/2019 6.9  3.8 - 10.8 Thousand/uL Final   RBC 07/17/2019 4.79  4.20 - 5.80 Million/uL Final   Hemoglobin 07/17/2019 14.6  13.2 - 17.1 g/dL Final   HCT 56/15/3794 43.4  38.5 - 50.0 % Final   MCV 07/17/2019 90.6  80.0 - 100.0 fL Final   MCH 07/17/2019 30.5  27.0 - 33.0 pg Final   MCHC 07/17/2019 33.6  32.0 - 36.0 g/dL Final   RDW 32/76/1470 11.8  11.0 - 15.0 % Final   Platelets 07/17/2019 187  140 -  400 Thousand/uL Final   MPV 07/17/2019 10.4  7.5 - 12.5 fL Final   Neutro Abs 07/17/2019 4,368  1,500 - 7,800 cells/uL Final   Lymphs Abs 07/17/2019 1,684  850 - 3,900 cells/uL Final   Absolute Monocytes 07/17/2019 573  200 - 950 cells/uL Final   Eosinophils Absolute 07/17/2019 228  15 - 500 cells/uL Final   Basophils Absolute 07/17/2019 48  0 - 200 cells/uL Final   Neutrophils Relative % 07/17/2019 63.3  % Final   Total Lymphocyte 07/17/2019 24.4  % Final   Monocytes Relative 07/17/2019 8.3  % Final   Eosinophils Relative 07/17/2019 3.3  % Final   Basophils Relative 07/17/2019 0.7  % Final   Glucose, Bld 07/17/2019 116* 65 - 99 mg/dL Final  Comment: .            Fasting reference interval . For someone without known diabetes, a glucose value between 100 and 125 mg/dL is consistent with prediabetes and should be confirmed with a follow-up test. .    BUN 07/17/2019 24  7 - 25 mg/dL Final   Creat 40/98/119108/09/2019 0.82  0.70 - 1.18 mg/dL Final   Comment: For patients >76 years of age, the reference limit for Creatinine is approximately 13% higher for people identified as African-American. .    GFR, Est Non African American 07/17/2019 86  > OR = 60 mL/min/1.973m2 Final   GFR, Est African American 07/17/2019 100  > OR = 60 mL/min/1.5273m2 Final   BUN/Creatinine Ratio 07/17/2019 NOT APPLICABLE  6 - 22 (calc) Final   Sodium 07/17/2019 142  135 - 146 mmol/L Final   Potassium 07/17/2019 4.2  3.5 - 5.3 mmol/L Final   Chloride 07/17/2019 107  98 - 110 mmol/L Final   CO2 07/17/2019 26  20 - 32 mmol/L Final   Calcium 07/17/2019 9.3  8.6 - 10.3 mg/dL Final   Total Protein 47/82/956208/09/2019 6.7  6.1 - 8.1 g/dL Final   Albumin 13/08/657808/09/2019 4.2  3.6 - 5.1 g/dL Final   Globulin 46/96/295208/09/2019 2.5  1.9 - 3.7 g/dL (calc) Final   AG Ratio 07/17/2019 1.7  1.0 - 2.5 (calc) Final   Total Bilirubin 07/17/2019 0.6  0.2 - 1.2 mg/dL Final   Alkaline phosphatase (APISO) 07/17/2019 85  35 - 144  U/L Final   AST 07/17/2019 22  10 - 35 U/L Final   ALT 07/17/2019 21  9 - 46 U/L Final   Cholesterol 07/17/2019 89  <200 mg/dL Final   HDL 84/13/244008/09/2019 35* > OR = 40 mg/dL Final   Triglycerides 10/27/253608/09/2019 87  <150 mg/dL Final   LDL Cholesterol (Calc) 07/17/2019 37  mg/dL (calc) Final   Comment: Reference range: <100 . Desirable range <100 mg/dL for primary prevention;   <70 mg/dL for patients with CHD or diabetic patients  with > or = 2 CHD risk factors. Marland Kitchen. LDL-C is now calculated using the Martin-Hopkins  calculation, which is a validated novel method providing  better accuracy than the Friedewald equation in the  estimation of LDL-C.  Horald PollenMartin SS et al. Lenox AhrJAMA. 6440;347(422013;310(19): 2061-2068  (http://education.[facility].com/faq/FAQ164)    Total CHOL/HDL Ratio 07/17/2019 2.5  <5.9<5.0 (calc) Final   Non-HDL Cholesterol (Calc) 07/17/2019 54  <130 mg/dL (calc) Final   Comment: For patients with diabetes plus 1 major ASCVD risk  factor, treating to a non-HDL-C goal of <100 mg/dL  (LDL-C of <56<70 mg/dL) is considered a therapeutic  option.    Microalb, Ur 07/17/2019 1.1  mg/dL Final   Comment: Reference Range Not established    RAM 07/17/2019    Final   Comment: . The ADA defines abnormalities in albumin excretion as follows: Marland Kitchen. Category         Result (mcg/mg creatinine) . Normal                    <30 Microalbuminuria         30-299  Clinical albuminuria   > OR = 300 . The ADA recommends that at least two of three specimens collected within a 3-6 month period be abnormal before considering a patient to be within a diagnostic category.    Sed Rate 07/17/2019 2  0 - 20 mm/h Final   Total CK 07/17/2019 81  44 -  196 U/L Final     08/01/19 Patient is here today for follow-up.  He is experienced no benefit since discontinuing Lipitor.  He continues to have pain in both shoulders.  He also complains of pain in his right bicep.  He also complains of pain in his right knee and  right anterior shin.  He has been using Aleve over-the-counter and seeing significant benefit however he is afraid to take that medication too often due to potential GI toxicity.  He also does not want to resume Lipitor despite the fact the pain did not improve after he stopped medication.  He denies any rash or fevers.  Sedimentation rate was completely normal at 2.  CK level was normal ruling out statin induced myopathy.  Patient's pain seems to be more arthritic.  Past Medical History:  Diagnosis Date   BPH with elevated PSA    Diabetes mellitus type 2 in nonobese [facility])    Diverticulosis    Erectile dysfunction    Esophageal stricture    GERD (gastroesophageal reflux disease)    Hiatal hernia    HLD (hyperlipidemia)    Past Surgical History:  Procedure Laterality Date   PROSTATE SURGERY     Current Outpatient Medications on File Prior to Visit  Medication Sig Dispense Refill   fexofenadine (ALLEGRA) 180 MG tablet Take 180 mg by mouth daily.     finasteride (PROSCAR) 5 MG tablet      fluticasone (FLONASE) 50 MCG/ACT nasal spray Place 2 sprays into both nostrils daily. (Patient taking differently: Place 1 spray into both nostrils daily. ) 16 g 6   metFORMIN (GLUCOPHAGE) 500 MG tablet Take 1 tablet (500 mg total) by mouth 2 (two) times daily with a meal. 180 tablet 3   tamsulosin (FLOMAX) 0.4 MG CAPS capsule Take 0.4 mg by mouth.     atorvastatin (LIPITOR) 20 MG tablet Take 1 tablet (20 mg total) by mouth daily. (Patient not taking: Reported on 08/01/2019) 90 tablet 3   No current facility-administered medications on file prior to visit.     Allergies  Allergen Reactions   Penicillins    Social History   Socioeconomic History   Marital status: Single    Spouse name: Not on file   Number of children: Not on file   Years of education: Not on file   Highest education level: Not on file  Occupational History   Not on file  Social Needs   Financial resource  strain: Not on file   Food insecurity    Worry: Not on file    Inability: Not on file   Transportation needs    Medical: Not on file    Non-medical: Not on file  Tobacco Use   Smoking status: Never Smoker   Smokeless tobacco: Never Used  Substance and Sexual Activity   Alcohol use: No   Drug use: No   Sexual activity: Yes    Comment: married, bus driver  Lifestyle   Physical activity    Days per week: Not on file    Minutes per session: Not on file   Stress: Not on file  Relationships   Social connections    Talks on phone: Not on file    Gets together: Not on file    Attends religious service: Not on file    Active member of club or organization: Not on file    Attends meetings of clubs or organizations: Not on file    Relationship status: Not on  file   Intimate partner violence    Fear of current or ex partner: Not on file    Emotionally abused: Not on file    Physically abused: Not on file    Forced sexual activity: Not on file  Other Topics Concern   Not on file  Social History Narrative   Not on file   Family History  Problem Relation Age of Onset   Heart disease Brother    Stroke Brother      Review of Systems  All other systems reviewed and are negative.      Objective:   Physical Exam  Constitutional: He is oriented to person, place, and time. He appears well-developed and well-nourished. No distress.  HENT:  Head: Normocephalic and atraumatic.  Neck: Normal range of motion. Neck supple. No thyromegaly present.  Cardiovascular: Normal rate, regular rhythm, normal heart sounds and intact distal pulses. Exam reveals no gallop and no friction rub.  No murmur heard. Pulmonary/Chest: Effort normal and breath sounds normal. No respiratory distress. He has no wheezes. He has no rales. He exhibits no tenderness.  Abdominal: Soft. Bowel sounds are normal.  Musculoskeletal:        General: No edema.     Right shoulder: He exhibits pain. He  exhibits normal range of motion, no effusion, no crepitus and normal strength.     Left shoulder: He exhibits normal range of motion, no tenderness, no pain and normal strength.     Right knee: He exhibits normal range of motion, no swelling and no effusion. No medial joint line and no lateral joint line tenderness noted.  Neurological: He is alert and oriented to person, place, and time. He has normal reflexes. No cranial nerve deficit. He exhibits normal muscle tone. Coordination normal.  Skin: Skin is warm. No rash noted. He is not diaphoretic. No erythema. No pallor.  Vitals reviewed.        Assessment & Plan:    The primary encounter diagnosis was Polyarthralgia. A diagnosis of Need for immunization against influenza was also pertinent to this visit. I suspect that his polyarthralgia is more likely osteoarthritis.  I recommended using diclofenac 75 mg twice a day as needed for pain.  Patient states that he will have to take the medication frequently.  Therefore I recommended that he start a proton pump inhibitor Protonix 40 mg a day to take with the meloxicam to help prevent ulcers.  We will switch his atorvastatin to pravastatin 20 mg a day.

## 2019-09-05 DIAGNOSIS — R69 Illness, unspecified: Secondary | ICD-10-CM | POA: Diagnosis not present

## 2019-10-02 ENCOUNTER — Other Ambulatory Visit: Payer: Self-pay | Admitting: Family Medicine

## 2019-10-18 DIAGNOSIS — R972 Elevated prostate specific antigen [PSA]: Secondary | ICD-10-CM | POA: Diagnosis not present

## 2019-10-25 DIAGNOSIS — N401 Enlarged prostate with lower urinary tract symptoms: Secondary | ICD-10-CM | POA: Diagnosis not present

## 2019-10-25 DIAGNOSIS — R972 Elevated prostate specific antigen [PSA]: Secondary | ICD-10-CM | POA: Diagnosis not present

## 2019-10-25 DIAGNOSIS — R350 Frequency of micturition: Secondary | ICD-10-CM | POA: Diagnosis not present

## 2019-11-07 ENCOUNTER — Other Ambulatory Visit: Payer: Self-pay | Admitting: Urology

## 2019-11-07 DIAGNOSIS — R972 Elevated prostate specific antigen [PSA]: Secondary | ICD-10-CM

## 2019-11-28 ENCOUNTER — Ambulatory Visit
Admission: RE | Admit: 2019-11-28 | Discharge: 2019-11-28 | Disposition: A | Discharge status: Home / Self Care | Payer: Medicare HMO | Source: Ambulatory Visit | Attending: Urology | Admitting: Urology

## 2019-11-28 ENCOUNTER — Other Ambulatory Visit: Payer: Self-pay

## 2019-11-28 DIAGNOSIS — R972 Elevated prostate specific antigen [PSA]: Secondary | ICD-10-CM | POA: Diagnosis not present

## 2019-11-28 MED ORDER — GADOBENATE DIMEGLUMINE 529 MG/ML IV SOLN
12.0000 mL | Freq: Once | INTRAVENOUS | Status: AC | PRN
  Administered 2019-11-28: 12 mL via INTRAVENOUS

## 2019-12-19 ENCOUNTER — Ambulatory Visit (INDEPENDENT_AMBULATORY_CARE_PROVIDER_SITE_OTHER): Payer: Medicare HMO | Admitting: Family Medicine

## 2019-12-19 ENCOUNTER — Other Ambulatory Visit: Payer: Self-pay

## 2019-12-19 VITALS — BP 130/80 | HR 76 | Temp 96.8°F | Resp 14 | Ht 68.0 in | Wt 136.0 lb

## 2019-12-19 DIAGNOSIS — M25511 Pain in right shoulder: Secondary | ICD-10-CM | POA: Diagnosis not present

## 2019-12-19 DIAGNOSIS — G8929 Other chronic pain: Secondary | ICD-10-CM

## 2019-12-19 NOTE — Progress Notes (Signed)
Subjective:    Patient ID: Bryan Mccullough, male    DOB: [birth year], 79 y.o.   MRN: [id number]  HPI  04/06/19 Patient reports a headache for the last 6 days.  The headache is a constant pressure-like pain in his frontal sinus area left greater than right.  It hurts worse if he leans forward such as when he puts on his shoes.  He denies any blurry vision.  He denies any photophobia or phonophobia.  He denies any diplopia.  He denies any nausea or vomiting.  He denies any neurologic deficit.  He denies any facial drooping or slurred speech.  He does have tenderness to percussion over his left frontal sinus.  He denies any rhinorrhea or sneezing or scratchy throat or itchy watery eyes.  He is still using his Flonase every day for sinusitis and allergies.  He denies any head trauma or loss of consciousness.  Cranial nerves II through XII are grossly intact with muscle strength 5/5 equal and symmetric in the upper and lower extremities.  There is no papilledema on funduscopic exam bilaterally.  The remainder of his exam today is completely normal.  He also reports some difficulty urinating.  Ever since he started taking dicyclomine for irritable bowel syndrome he is had increasing difficulty urination.  He does have a history of BPH on Flomax as well as finasteride and is possible that antihistamine is exacerbating that.  He also reports some mild dysuria.  At that time, my plan was: Patient would like lab work to check his cholesterol since he has not had lab work in over 2 years.  He would also like to monitor his liver and kidney test as well as his blood sugar.  Therefore I will obtain a CBC, CMP and a fasting lipid panel.  I suspect that his headache is likely sinusitis in the left frontal sinus.  I will start him on Levaquin 500 mg daily for 5 days and a prednisone taper pack.  I think his urinary hesitancy is likely due to his BPH exacerbated by the anticholinergic medicine.  Therefore I recommended that  he discontinue Bentyl.  I will check a urinalysis to rule out a urinary tract infection.  04/14/19 Sugar was elevated and HgA1c confirms new onset diabetes mellitus 2.  He also has HLD as his goal LDL should be less than 100 as a diabetic.  At that time, my plan was: Spent approximately 20 minutes today with the patient reviewing the natural history of diabetes.  I recommended a low carbohydrate diet.  I recommended less than 45 g of carbs per meal.  Also recommended 30 minutes a day of aerobic exercise.  Meanwhile I would start metformin 500 mg p.o. twice daily as well as Lipitor 20 mg a day to address his hyperlipidemia as well as his hyperglycemia.  Monitor the patient for GI upset on the metformin particular given his history of IBS.  Reassess the patient and his lab work in 3 months  07/17/19 Here for follow up.  Patient reports diffuse body aches over the last 4 weeks.  He states that his shoulders hurt and ache constantly.  He feels weak in his upper extremities.  He also complains of weakness in his proximal thigh muscles as well as pain in his knees.  He denies any fevers or chills or rash.  Examination of his fingernails does show some horizontal pitting particularly on his left hand but he has no history of psoriasis that he is  aware of.  There is no similar pitting on his right hand.  He denies any recent tick bites.  He did recently start Lipitor and I question if this could be statin induced myopathy.  He denies any nausea or vomiting or diarrhea on metformin.  He is not regularly checking his blood sugars but he denies any polyuria, polydipsia, or blurry vision.  He denies hypoglycemic episodes.  At that time, my plan was: 1. Diabetes mellitus type 2 in nonobese (HCC) Check hemoglobin A1c.  Goal hemoglobin A1c is less than 6.5.  If hemoglobin A1c is less than 6.5, I will make no additional changes in his metformin. - Hemoglobin A1c - CBC with Differential/Platelet - COMPLETE METABOLIC PANEL  WITH GFR - Lipid panel - Microalbumin, urine  2. Pure hypercholesterolemia Check fasting lipid panel.  Goal LDL cholesterol is less than 100. - CBC with Differential/Platelet - COMPLETE METABOLIC PANEL WITH GFR - Lipid panel  3. Polyarthralgia I am concerned about statin induced myopathy although polymyalgia rheumatica and even possibly psoriatic arthritis would be on the differential diagnosis.  Discontinue Lipitor and then reassess for improvement over the next 1 to 2 weeks.  Check a sedimentation rate and if elevated consider PMR versus psoriatic arthritis. - Sedimentation rate - CK No visits with results within 2 Week(s) from this visit.  Latest known visit with results is:  Office Visit on 07/17/2019  Component Date Value Ref Range Status  . Hgb A1c MFr Bld 07/17/2019 6.0* <5.7 % of total Hgb Final   Comment: For someone without known diabetes, a hemoglobin  A1c value between 5.7% and 6.4% is consistent with prediabetes and should be confirmed with a  follow-up test. . For someone with known diabetes, a value <7% indicates that their diabetes is well controlled. A1c targets should be individualized based on duration of diabetes, age, comorbid conditions, and other considerations. . This assay result is consistent with an increased risk of diabetes. . Currently, no consensus exists regarding use of hemoglobin A1c for diagnosis of diabetes for children. .   . Mean Plasma Glucose 07/17/2019 126  (calc) Final  . eAG (mmol/L) 07/17/2019 7.0  (calc) Final  . WBC 07/17/2019 6.9  3.8 - 10.8 Thousand/uL Final  . RBC 07/17/2019 4.79  4.20 - 5.80 Million/uL Final  . Hemoglobin 07/17/2019 14.6  13.2 - 17.1 g/dL Final  . HCT 77/93/9030 43.4  38.5 - 50.0 % Final  . MCV 07/17/2019 90.6  80.0 - 100.0 fL Final  . MCH 07/17/2019 30.5  27.0 - 33.0 pg Final  . MCHC 07/17/2019 33.6  32.0 - 36.0 g/dL Final  . RDW 08/29/3006 11.8  11.0 - 15.0 % Final  . Platelets 07/17/2019 187  140 -  400 Thousand/uL Final  . MPV 07/17/2019 10.4  7.5 - 12.5 fL Final  . Neutro Abs 07/17/2019 4,368  1,500 - 7,800 cells/uL Final  . Lymphs Abs 07/17/2019 1,684  850 - 3,900 cells/uL Final  . Absolute Monocytes 07/17/2019 573  200 - 950 cells/uL Final  . Eosinophils Absolute 07/17/2019 228  15 - 500 cells/uL Final  . Basophils Absolute 07/17/2019 48  0 - 200 cells/uL Final  . Neutrophils Relative % 07/17/2019 63.3  % Final  . Total Lymphocyte 07/17/2019 24.4  % Final  . Monocytes Relative 07/17/2019 8.3  % Final  . Eosinophils Relative 07/17/2019 3.3  % Final  . Basophils Relative 07/17/2019 0.7  % Final  . Glucose, Bld 07/17/2019 116* 65 - 99 mg/dL Final  Comment: .            Fasting reference interval . For someone without known diabetes, a glucose value between 100 and 125 mg/dL is consistent with prediabetes and should be confirmed with a follow-up test. .   . BUN 07/17/2019 24  7 - 25 mg/dL Final  . Creat 16/10/960408/09/2019 0.82  0.70 - 1.18 mg/dL Final   Comment: For patients >77 years of age, the reference limit for Creatinine is approximately 13% higher for people identified as African-American. .   . GFR, Est Non African American 07/17/2019 86  > OR = 60 mL/min/1.6473m2 Final  . GFR, Est African American 07/17/2019 100  > OR = 60 mL/min/1.6173m2 Final  . BUN/Creatinine Ratio 07/17/2019 NOT APPLICABLE  6 - 22 (calc) Final  . Sodium 07/17/2019 142  135 - 146 mmol/L Final  . Potassium 07/17/2019 4.2  3.5 - 5.3 mmol/L Final  . Chloride 07/17/2019 107  98 - 110 mmol/L Final  . CO2 07/17/2019 26  20 - 32 mmol/L Final  . Calcium 07/17/2019 9.3  8.6 - 10.3 mg/dL Final  . Total Protein 07/17/2019 6.7  6.1 - 8.1 g/dL Final  . Albumin 54/09/811908/09/2019 4.2  3.6 - 5.1 g/dL Final  . Globulin 14/78/295608/09/2019 2.5  1.9 - 3.7 g/dL (calc) Final  . AG Ratio 07/17/2019 1.7  1.0 - 2.5 (calc) Final  . Total Bilirubin 07/17/2019 0.6  0.2 - 1.2 mg/dL Final  . Alkaline phosphatase (APISO) 07/17/2019 85  35 - 144  U/L Final  . AST 07/17/2019 22  10 - 35 U/L Final  . ALT 07/17/2019 21  9 - 46 U/L Final  . Cholesterol 07/17/2019 89  <200 mg/dL Final  . HDL 21/30/865708/09/2019 35* > OR = 40 mg/dL Final  . Triglycerides 07/17/2019 87  <150 mg/dL Final  . LDL Cholesterol (Calc) 07/17/2019 37  mg/dL (calc) Final   Comment: Reference range: <100 . Desirable range <100 mg/dL for primary prevention;   <70 mg/dL for patients with CHD or diabetic patients  with > or = 2 CHD risk factors. Marland Kitchen. LDL-C is now calculated using the Martin-Hopkins  calculation, which is a validated novel method providing  better accuracy than the Friedewald equation in the  estimation of LDL-C.  Horald PollenMartin SS et al. Lenox AhrJAMA. 8469;629(522013;310(19): 2061-2068  (http://education.[facility].com/faq/FAQ164)   . Total CHOL/HDL Ratio 07/17/2019 2.5  <8.4<5.0 (calc) Final  . Non-HDL Cholesterol (Calc) 07/17/2019 54  <130 mg/dL (calc) Final   Comment: For patients with diabetes plus 1 major ASCVD risk  factor, treating to a non-HDL-C goal of <100 mg/dL  (LDL-C of <13<70 mg/dL) is considered a therapeutic  option.   Alyson Reedy. Microalb, Ur 07/17/2019 1.1  mg/dL Final   Comment: Reference Range Not established   . RAM 07/17/2019    Final   Comment: . The ADA defines abnormalities in albumin excretion as follows: Marland Kitchen. Category         Result (mcg/mg creatinine) . Normal                    <30 Microalbuminuria         30-299  Clinical albuminuria   > OR = 300 . The ADA recommends that at least two of three specimens collected within a 3-6 month period be abnormal before considering a patient to be within a diagnostic category.   . Sed Rate 07/17/2019 2  0 - 20 mm/h Final  . Total CK 07/17/2019 81  44 -  196 U/L Final     08/01/19 Patient is here today for follow-up.  He is experienced no benefit since discontinuing Lipitor.  He continues to have pain in both shoulders.  He also complains of pain in his right bicep.  He also complains of pain in his right knee and  right anterior shin.  He has been using Aleve over-the-counter and seeing significant benefit however he is afraid to take that medication too often due to potential GI toxicity.  He also does not want to resume Lipitor despite the fact the pain did not improve after he stopped medication.  He denies any rash or fevers.  Sedimentation rate was completely normal at 2.  CK level was normal ruling out statin induced myopathy.  Patient's pain seems to be more arthritic.  At that time, my plan was: I suspect that his polyarthralgia is more likely osteoarthritis.  I recommended using diclofenac 75 mg twice a day as needed for pain.  Patient states that he will have to take the medication frequently.  Therefore I recommended that he start a proton pump inhibitor Protonix 40 mg a day to take with the meloxicam to help prevent ulcers.  We will switch his atorvastatin to pravastatin 20 mg a day.  12/19/19 Patient presents today for pain in his right shoulder.  He has pain in both shoulders but his right is the worst.  When I last saw him, I had suspected osteoarthritis in the shoulder and had recommended diclofenac 75 mg twice a day.  I was concerned that the patient who already has GERD would likely have stomach pain from doing this also added Protonix 40 mg a day for GI protection.  Patient has become confused and has stopped both of these medication.  He states that he believes the diclofenac is helping with his shoulder pain.  He states that with his shoulder at his side he is able to do anything throughout the day with no pain.  However he has pain with abduction greater than 90 degrees and his shoulder aches and throbs at night keeping him awake.  The symptoms sound more consistent with bursitis in the shoulder.  In April I gave him a cortisone injection in the right subacromial space and he states the pain stayed away for several months before returned.  Therefore I am uncertain if the patient has osteoarthritis or  if this could be subacromial bursitis.  Past Medical History:  Diagnosis Date  . BPH with elevated PSA   . Diabetes mellitus type 2 in nonobese (HCC)   . Diverticulosis   . Erectile dysfunction   . Esophageal stricture   . GERD (gastroesophageal reflux disease)   . Hiatal hernia   . HLD (hyperlipidemia)    Past Surgical History:  Procedure Laterality Date  . PROSTATE SURGERY     Current Outpatient Medications on File Prior to Visit  Medication Sig Dispense Refill  . tamsulosin (FLOMAX) 0.4 MG CAPS capsule Take 0.4 mg by mouth.    . diclofenac (VOLTAREN) 75 MG EC tablet Take 1 tablet (75 mg total) by mouth 2 (two) times daily. (Patient not taking: Reported on 12/19/2019) 60 tablet 3  . fexofenadine (ALLEGRA) 180 MG tablet Take 180 mg by mouth daily.    . finasteride (PROSCAR) 5 MG tablet     . fluticasone (FLONASE) 50 MCG/ACT nasal spray Place 2 sprays into both nostrils daily. (Patient taking differently: Place 1 spray into both nostrils daily. ) 16 g 6  .  metFORMIN (GLUCOPHAGE) 500 MG tablet Take 1 tablet (500 mg total) by mouth 2 (two) times daily with a meal. 180 tablet 3  . pantoprazole (PROTONIX) 40 MG tablet Take 1 tablet (40 mg total) by mouth daily. (Patient not taking: Reported on 12/19/2019) 30 tablet 3  . pravastatin (PRAVACHOL) 20 MG tablet Take 1 tablet (20 mg total) by mouth daily. 90 tablet 3   No current facility-administered medications on file prior to visit.    Allergies  Allergen Reactions  . Penicillins    Social History   Socioeconomic History  . Marital status: Single    Spouse name: Not on file  . Number of children: Not on file  . Years of education: Not on file  . Highest education level: Not on file  Occupational History  . Not on file  Tobacco Use  . Smoking status: Never Smoker  . Smokeless tobacco: Never Used  Substance and Sexual Activity  . Alcohol use: No  . Drug use: No  . Sexual activity: Yes    Comment: married, bus driver  Other  Topics Concern  . Not on file  Social History Narrative  . Not on file   Social Determinants of Health   Financial Resource Strain:   . Difficulty of Paying Living Expenses: Not on file  Food Insecurity:   . Worried About Charity fundraiser in the Last Year: Not on file  . Ran Out of Food in the Last Year: Not on file  Transportation Needs:   . Lack of Transportation (Medical): Not on file  . Lack of Transportation (Non-Medical): Not on file  Physical Activity:   . Days of Exercise per Week: Not on file  . Minutes of Exercise per Session: Not on file  Stress:   . Feeling of Stress : Not on file  Social Connections:   . Frequency of Communication with Friends and Family: Not on file  . Frequency of Social Gatherings with Friends and Family: Not on file  . Attends Religious Services: Not on file  . Active Member of Clubs or Organizations: Not on file  . Attends Archivist Meetings: Not on file  . Marital Status: Not on file  Intimate Partner Violence:   . Fear of Current or Ex-Partner: Not on file  . Emotionally Abused: Not on file  . Physically Abused: Not on file  . Sexually Abused: Not on file   Family History  Problem Relation Age of Onset  . Heart disease Brother   . Stroke Brother      Review of Systems  All other systems reviewed and are negative.      Objective:   Physical Exam  Constitutional: He is oriented to person, place, and time. He appears well-developed and well-nourished. No distress.  HENT:  Head: Normocephalic and atraumatic.  Neck: No thyromegaly present.  Cardiovascular: Normal rate, regular rhythm, normal heart sounds and intact distal pulses. Exam reveals no gallop and no friction rub.  No murmur heard. Pulmonary/Chest: Effort normal and breath sounds normal. No respiratory distress. He has no wheezes. He has no rales. He exhibits no tenderness.  Abdominal: Soft. Bowel sounds are normal.  Musculoskeletal:        General: No  edema.     Right shoulder: Pain present. No effusion or crepitus. Normal range of motion. Normal strength.     Left shoulder: No tenderness or pain. Normal range of motion. Normal strength.     Cervical back: Normal range  of motion and neck supple.     Right knee: No swelling or effusion. Normal range of motion. No medial joint line or lateral joint line tenderness.  Neurological: He is alert and oriented to person, place, and time. He has normal reflexes. No cranial nerve deficit. He exhibits normal muscle tone. Coordination normal.  Skin: Skin is warm. No rash noted. He is not diaphoretic. No erythema. No pallor.  Vitals reviewed.        Assessment & Plan:    The encounter diagnosis was Chronic right shoulder pain.  Uncertain as to whether the patient has subacromial bursitis in the right shoulder or if he has osteoarthritis.  I have sent the patient today for an x-ray.  I would like the patient to continue to take diclofenac 75 mg twice daily for shoulder pain.  He can use pantoprazole for GI protection to prevent peptic ulcer disease given the chronic use of NSAIDs.  Patient requests a cortisone injection in his right shoulder.  Using sterile technique, I injected the right subacromial space with 2 cc lidocaine, 2 cc of Marcaine, and 2 cc of 40 mg/mL Kenalog.  If the x-ray does not show any arthritis and the cortisone injection does not help I would recommend orthopedics consultation for possible rotator cuff tendinitis.

## 2019-12-27 DIAGNOSIS — R972 Elevated prostate specific antigen [PSA]: Secondary | ICD-10-CM | POA: Diagnosis not present

## 2019-12-27 DIAGNOSIS — N401 Enlarged prostate with lower urinary tract symptoms: Secondary | ICD-10-CM | POA: Diagnosis not present

## 2019-12-27 DIAGNOSIS — R3912 Poor urinary stream: Secondary | ICD-10-CM | POA: Diagnosis not present

## 2020-01-09 ENCOUNTER — Other Ambulatory Visit: Payer: Self-pay | Admitting: Family Medicine

## 2020-02-21 DIAGNOSIS — R972 Elevated prostate specific antigen [PSA]: Secondary | ICD-10-CM | POA: Diagnosis not present

## 2020-02-28 DIAGNOSIS — N401 Enlarged prostate with lower urinary tract symptoms: Secondary | ICD-10-CM | POA: Diagnosis not present

## 2020-02-28 DIAGNOSIS — R972 Elevated prostate specific antigen [PSA]: Secondary | ICD-10-CM | POA: Diagnosis not present

## 2020-02-28 DIAGNOSIS — R3912 Poor urinary stream: Secondary | ICD-10-CM | POA: Diagnosis not present

## 2020-05-02 ENCOUNTER — Other Ambulatory Visit: Payer: Self-pay

## 2020-05-02 ENCOUNTER — Ambulatory Visit (INDEPENDENT_AMBULATORY_CARE_PROVIDER_SITE_OTHER): Payer: Medicare HMO | Admitting: Family Medicine

## 2020-05-02 VITALS — BP 120/70 | HR 77 | Temp 97.8°F | Resp 14 | Ht 68.0 in | Wt 140.0 lb

## 2020-05-02 DIAGNOSIS — M25512 Pain in left shoulder: Secondary | ICD-10-CM | POA: Diagnosis not present

## 2020-05-02 DIAGNOSIS — M542 Cervicalgia: Secondary | ICD-10-CM | POA: Diagnosis not present

## 2020-05-02 DIAGNOSIS — E119 Type 2 diabetes mellitus without complications: Secondary | ICD-10-CM

## 2020-05-02 DIAGNOSIS — G8929 Other chronic pain: Secondary | ICD-10-CM

## 2020-05-02 DIAGNOSIS — E7800 Pure hypercholesterolemia, unspecified: Secondary | ICD-10-CM

## 2020-05-02 DIAGNOSIS — M25511 Pain in right shoulder: Secondary | ICD-10-CM

## 2020-05-02 MED ORDER — MELOXICAM 15 MG PO TABS: 15.0000 mg | ORAL_TABLET | Freq: Every day | ORAL | 3 refills | Status: DC

## 2020-05-02 NOTE — Progress Notes (Signed)
Subjective:    Patient ID: Bryan Mccullough, male    DOB: 1941/10/06, 79 y.o.   MRN: [id number]  HPI   12/19/19 Patient presents today for pain in his right shoulder.  He has pain in both shoulders but his right is the worst.  When I last saw him, I had suspected osteoarthritis in the shoulder and had recommended diclofenac 75 mg twice a day.  I was concerned that the patient who already has GERD would likely have stomach pain from doing this also added Protonix 40 mg a day for GI protection.  Patient has become confused and has stopped both of these medication.  He states that he believes the diclofenac is helping with his shoulder pain.  He states that with his shoulder at his side he is able to do anything throughout the day with no pain.  However he has pain with abduction greater than 90 degrees and his shoulder aches and throbs at night keeping him awake.  The symptoms sound more consistent with bursitis in the shoulder.  In April I gave him a cortisone injection in the right subacromial space and he states the pain stayed away for several months before returned.  Therefore I am uncertain if the patient has osteoarthritis or if this could be subacromial bursitis.  At that time, my plan was: Uncertain as to whether the patient has subacromial bursitis in the right shoulder or if he has osteoarthritis.  I have sent the patient today for an x-ray.  I would like the patient to continue to take diclofenac 75 mg twice daily for shoulder pain.  He can use pantoprazole for GI protection to prevent peptic ulcer disease given the chronic use of NSAIDs.  Patient requests a cortisone injection in his right shoulder.  Using sterile technique, I injected the right subacromial space with 2 cc lidocaine, 2 cc of Marcaine, and 2 cc of 40 mg/mL Kenalog.  If the x-ray does not show any arthritis and the cortisone injection does not help I would recommend orthopedics consultation for possible rotator cuff  tendinitis.  05/02/20 History is difficult to obtain. I am uncertain whether the patient has been taking diclofenac 75 mg twice a day. He seems very confused about this medication and it does not appear that he has been taking it. However the medicine seem like it helped. Patient is uncertain. After asking him several times he states that the medicine did help but I question if he is even been taking it based on his confusion about the name of the medication. He again complains of pain in both shoulders. However now they are equal in pain. He reports pain with passive range of motion. There is palpable crepitus in both shoulders. He has pain with abduction greater than 90 degrees bilaterally. However he is also complaining of pain in his neck. He also occasionally reports numbness in his hands at night sleeping. He reports occasional pains radiating down his arms into his hands. He has negative Tinel and negative Phalen sign today. He has pain with Hawkins maneuver. He has pain with can testing bilaterally. He was diagnosed with diabetes last year. He is overdue for fasting lab work pending to discuss well  Past Medical History:  Diagnosis Date  . BPH with elevated PSA   . Diabetes mellitus type 2 in nonobese (HCC)   . Diverticulosis   . Erectile dysfunction   . Esophageal stricture   . GERD (gastroesophageal reflux disease)   . Hiatal hernia   .  HLD (hyperlipidemia)    Past Surgical History:  Procedure Laterality Date  . PROSTATE SURGERY     Current Outpatient Medications on File Prior to Visit  Medication Sig Dispense Refill  . diclofenac (VOLTAREN) 75 MG EC tablet Take 1 tablet (75 mg total) by mouth 2 (two) times daily. 60 tablet 3  . finasteride (PROSCAR) 5 MG tablet     . fluticasone (FLONASE) 50 MCG/ACT nasal spray Place 2 sprays into both nostrils daily. (Patient taking differently: Place 1 spray into both nostrils daily. ) 16 g 6  . metFORMIN (GLUCOPHAGE) 500 MG tablet TAKE 1 TABLET  BY MOUTH TWICE DAILY WITH A MEAL 180 tablet 2  . pantoprazole (PROTONIX) 40 MG tablet Take 1 tablet (40 mg total) by mouth daily. 30 tablet 3  . pravastatin (PRAVACHOL) 20 MG tablet Take 1 tablet (20 mg total) by mouth daily. 90 tablet 3  . tamsulosin (FLOMAX) 0.4 MG CAPS capsule Take 0.4 mg by mouth.    . fexofenadine (ALLEGRA) 180 MG tablet Take 180 mg by mouth daily.     No current facility-administered medications on file prior to visit.    Allergies  Allergen Reactions  . Penicillins    Social History   Socioeconomic History  . Marital status: Single    Spouse name: Not on file  . Number of children: Not on file  . Years of education: Not on file  . Highest education level: Not on file  Occupational History  . Not on file  Tobacco Use  . Smoking status: Never Smoker  . Smokeless tobacco: Never Used  Substance and Sexual Activity  . Alcohol use: No  . Drug use: No  . Sexual activity: Yes    Comment: married, bus driver  Other Topics Concern  . Not on file  Social History Narrative  . Not on file   Social Determinants of Health   Financial Resource Strain:   . Difficulty of Paying Living Expenses:   Food Insecurity:   . Worried About Programme researcher, broadcasting/film/video in the Last Year:   . Barista in the Last Year:   Transportation Needs:   . Freight forwarder (Medical):   Marland Kitchen Lack of Transportation (Non-Medical):   Physical Activity:   . Days of Exercise per Week:   . Minutes of Exercise per Session:   Stress:   . Feeling of Stress :   Social Connections:   . Frequency of Communication with Friends and Family:   . Frequency of Social Gatherings with Friends and Family:   . Attends Religious Services:   . Active Member of Clubs or Organizations:   . Attends Banker Meetings:   Marland Kitchen Marital Status:   Intimate Partner Violence:   . Fear of Current or Ex-Partner:   . Emotionally Abused:   Marland Kitchen Physically Abused:   . Sexually Abused:    Family History   Problem Relation Age of Onset  . Heart disease Brother   . Stroke Brother      Review of Systems  Musculoskeletal: Positive for back pain.  All other systems reviewed and are negative.      Objective:   Physical Exam  Constitutional: He appears well-developed and well-nourished. No distress.  HENT:  Head: Normocephalic and atraumatic.  Neck: No thyromegaly present.  Cardiovascular: Normal rate, regular rhythm, normal heart sounds and intact distal pulses. Exam reveals no gallop and no friction rub.  No murmur heard. Pulmonary/Chest: Effort normal and breath  sounds normal. No respiratory distress. He has no wheezes. He has no rales. He exhibits no tenderness.  Abdominal: Soft. Bowel sounds are normal.  Musculoskeletal:        General: No edema.     Right shoulder: Crepitus and pain present. No effusion. Decreased range of motion. Normal strength.     Left shoulder: Crepitus present. No tenderness or pain. Decreased range of motion. Normal strength.     Cervical back: Normal range of motion and neck supple. Tenderness and pain present. No bony tenderness.       Back:     Right knee: No swelling or effusion. Normal range of motion. No medial joint line or lateral joint line tenderness.  Neurological: He has normal reflexes. He exhibits normal muscle tone.  Skin: Skin is warm. No rash noted. He is not diaphoretic. No erythema. No pallor.  Vitals reviewed.        Assessment & Plan:    Chronic right shoulder pain - Plan: DG Shoulder Right  Chronic left shoulder pain - Plan: DG Shoulder Left  Bilateral posterior neck pain - Plan: DG Cervical Spine Complete  Diabetes mellitus type 2 in nonobese (HCC) - Plan: Hemoglobin A1c, CBC with Differential/Platelet, COMPLETE METABOLIC PANEL WITH GFR, Lipid panel, Microalbumin, urine  Pure hypercholesterolemia - Plan: Hemoglobin A1c, CBC with Differential/Platelet, COMPLETE METABOLIC PANEL WITH GFR, Lipid panel, Microalbumin,  urine  At this point is difficult to determine the cause of the patient's pain. His exam would suggest possible osteoarthritis in both shoulders versus subacromial bursitis although I cannot rule out cervical radiculopathy. Therefore I have again asked the patient to get an x-ray. He has yet to get the x-ray of his shoulder. I will order an x-ray of both his right and left shoulder along with cervical spine. He has not been taking diclofenac from what I gather. Therefore in an effort to simplify his medications, I will start him on Mobic 15 mg a day for left suspect is osteoarthritis in the shoulders. Of asked him to take this consistently to see if the pain in the shoulder proved. If the pain does not improve, we will need to be concerned about GI toxicity depending upon how long we will continue the medication but at the present time I simply want to see if the medicine will help. Of asked that the patient come in fasting tomorrow for a CBC, CMP, lipid panel, A1c, and urine microalbumin pertaining to his diabetes.

## 2020-05-03 ENCOUNTER — Ambulatory Visit
Admission: RE | Admit: 2020-05-03 | Discharge: 2020-05-03 | Disposition: A | Discharge status: Home / Self Care | Payer: Medicare HMO | Source: Ambulatory Visit | Attending: Family Medicine | Admitting: Family Medicine

## 2020-05-03 ENCOUNTER — Other Ambulatory Visit: Payer: Medicare HMO

## 2020-05-03 DIAGNOSIS — M25511 Pain in right shoulder: Secondary | ICD-10-CM

## 2020-05-03 DIAGNOSIS — M542 Cervicalgia: Secondary | ICD-10-CM

## 2020-05-03 DIAGNOSIS — M19011 Primary osteoarthritis, right shoulder: Secondary | ICD-10-CM | POA: Diagnosis not present

## 2020-05-03 DIAGNOSIS — G8929 Other chronic pain: Secondary | ICD-10-CM

## 2020-05-03 DIAGNOSIS — M19012 Primary osteoarthritis, left shoulder: Secondary | ICD-10-CM | POA: Diagnosis not present

## 2020-05-03 DIAGNOSIS — E119 Type 2 diabetes mellitus without complications: Secondary | ICD-10-CM

## 2020-05-03 DIAGNOSIS — E7800 Pure hypercholesterolemia, unspecified: Secondary | ICD-10-CM

## 2020-05-03 DIAGNOSIS — M2550 Pain in unspecified joint: Secondary | ICD-10-CM

## 2020-05-03 DIAGNOSIS — Z13220 Encounter for screening for lipoid disorders: Secondary | ICD-10-CM

## 2020-05-04 LAB — CBC WITH DIFFERENTIAL/PLATELET
Absolute Monocytes: 457 cells/uL (ref 200–950)
Basophils Absolute: 33 cells/uL (ref 0–200)
Basophils Relative: 0.6 %
Eosinophils Absolute: 193 cells/uL (ref 15–500)
Eosinophils Relative: 3.5 %
HCT: 44.2 % (ref 38.5–50.0)
Hemoglobin: 14.9 g/dL (ref 13.2–17.1)
Lymphs Abs: 2107 cells/uL (ref 850–3900)
MCH: 30.1 pg (ref 27.0–33.0)
MCHC: 33.7 g/dL (ref 32.0–36.0)
MCV: 89.3 fL (ref 80.0–100.0)
MPV: 10.3 fL (ref 7.5–12.5)
Monocytes Relative: 8.3 %
Neutro Abs: 2712 cells/uL (ref 1500–7800)
Neutrophils Relative %: 49.3 %
Platelets: 193 10*3/uL (ref 140–400)
RBC: 4.95 10*6/uL (ref 4.20–5.80)
RDW: 12.1 % (ref 11.0–15.0)
Total Lymphocyte: 38.3 %
WBC: 5.5 10*3/uL (ref 3.8–10.8)

## 2020-05-04 LAB — COMPREHENSIVE METABOLIC PANEL
AG Ratio: 1.6 (calc) (ref 1.0–2.5)
ALT: 12 U/L (ref 9–46)
AST: 19 U/L (ref 10–35)
Albumin: 4.1 g/dL (ref 3.6–5.1)
Alkaline phosphatase (APISO): 83 U/L (ref 35–144)
BUN: 19 mg/dL (ref 7–25)
CO2: 26 mmol/L (ref 20–32)
Calcium: 9.3 mg/dL (ref 8.6–10.3)
Chloride: 106 mmol/L (ref 98–110)
Creat: 0.92 mg/dL (ref 0.70–1.18)
Globulin: 2.5 g/dL (calc) (ref 1.9–3.7)
Glucose, Bld: 114 mg/dL — ABNORMAL HIGH (ref 65–99)
Potassium: 4.8 mmol/L (ref 3.5–5.3)
Sodium: 143 mmol/L (ref 135–146)
Total Bilirubin: 0.7 mg/dL (ref 0.2–1.2)
Total Protein: 6.6 g/dL (ref 6.1–8.1)

## 2020-05-04 LAB — HEMOGLOBIN A1C
Hgb A1c MFr Bld: 5.8 % of total Hgb — ABNORMAL HIGH (ref <5.7)
Mean Plasma Glucose: 120 (calc)
eAG (mmol/L): 6.6 (calc)

## 2020-05-04 LAB — LIPID PANEL
Cholesterol: 138 mg/dL (ref <200)
HDL: 36 mg/dL — ABNORMAL LOW (ref >=40)
LDL Cholesterol (Calc): 75 mg/dL (calc)
Non-HDL Cholesterol (Calc): 102 mg/dL (calc) (ref <130)
Total CHOL/HDL Ratio: 3.8 (calc) (ref <5.0)
Triglycerides: 172 mg/dL — ABNORMAL HIGH (ref <150)

## 2020-05-04 LAB — MICROALBUMIN / CREATININE URINE RATIO
Creatinine, Urine: 201 mg/dL (ref 20–320)
Microalb Creat Ratio: 14 mcg/mg creat (ref <30)
Microalb, Ur: 2.8 mg/dL

## 2020-05-15 ENCOUNTER — Telehealth: Payer: Self-pay | Admitting: Family Medicine

## 2020-05-15 NOTE — Telephone Encounter (Signed)
error 

## 2020-07-18 DIAGNOSIS — Z7984 Long term (current) use of oral hypoglycemic drugs: Secondary | ICD-10-CM | POA: Diagnosis not present

## 2020-07-18 DIAGNOSIS — H52223 Regular astigmatism, bilateral: Secondary | ICD-10-CM | POA: Diagnosis not present

## 2020-07-18 DIAGNOSIS — H524 Presbyopia: Secondary | ICD-10-CM | POA: Diagnosis not present

## 2020-07-18 DIAGNOSIS — H25093 Other age-related incipient cataract, bilateral: Secondary | ICD-10-CM | POA: Diagnosis not present

## 2020-07-18 DIAGNOSIS — E119 Type 2 diabetes mellitus without complications: Secondary | ICD-10-CM | POA: Diagnosis not present

## 2020-07-18 DIAGNOSIS — H5203 Hypermetropia, bilateral: Secondary | ICD-10-CM | POA: Diagnosis not present

## 2020-07-18 LAB — HM DIABETES EYE EXAM

## 2020-08-09 ENCOUNTER — Other Ambulatory Visit: Payer: Self-pay

## 2020-08-09 ENCOUNTER — Ambulatory Visit (INDEPENDENT_AMBULATORY_CARE_PROVIDER_SITE_OTHER): Payer: Medicare HMO | Admitting: Family Medicine

## 2020-08-09 VITALS — BP 120/60 | HR 85 | Temp 98.4°F | Ht 68.0 in | Wt 140.0 lb

## 2020-08-09 DIAGNOSIS — R1030 Lower abdominal pain, unspecified: Secondary | ICD-10-CM | POA: Diagnosis not present

## 2020-08-09 DIAGNOSIS — R972 Elevated prostate specific antigen [PSA]: Secondary | ICD-10-CM

## 2020-08-09 DIAGNOSIS — N400 Enlarged prostate without lower urinary tract symptoms: Secondary | ICD-10-CM

## 2020-08-09 MED ORDER — SENNOSIDES-DOCUSATE SODIUM 8.6-50 MG PO TABS: 2.0000 | ORAL_TABLET | Freq: Every day | ORAL | 3 refills | Status: AC

## 2020-08-09 NOTE — Progress Notes (Signed)
Subjective:    Patient ID: Bryan Mccullough, male    DOB: 1941/05/15, 79 y.o.   MRN: [id number]  HPI  06/17/15 Patient is reporting vague abdominal pain.  It is located in the right lower quadrant and the left lower quadrant. It has been there for several years. He is seeing GI who performed a colonoscopy which was significant only for diverticulosis and internal hemorrhoids. He sees a urologist for BPH and has his prostate checked annually. They also check his PSA.  Patient had similar pain dating all the way back to 2011. I checked a CT scan at that time which revealed no abnormalities other than a thick walled bladder and enlarged prostate. The pain has not changed. It occurs on a daily basis. It is a bloating feeling that tends to improve with bowel movements. Patient states that he is not having a bowel movement but only every 3 days. They tend to be hard. He denies any melanoma or hematochezia. He denies any fevers or chills. He denies any nausea or vomiting or bilious emesis. He denies any weight loss. He denies any rash. He does continue have mild constipation. Movement does not exacerbate the pain. At that time,my plan was: Patient's lab work is excellent. I believe the patient's abdominal pain which is chronic may be IBS versus diverticular pain related to chronic constipation. Therefore I will start the patient on linzess 145 mcg poqday and recheck in 2 weeks. 01/14/16 Patient is here today for complete physical exam. He continues to report lower abdominal pain that has been chronic and intermittent for the last 6 years. His last colonoscopy was in 2012. At that time his colonoscopy was significant only for diverticulosis. He has not had any further workup aside from a normal colonoscopy normal lab work normal CT scan and urology referral. However the pain is vague. There is no particular exacerbating or alleviating factors. It tends to be located in the left lower quadrant. Certainly IBS versus  diverticulosis are leading possible causes , differential diagnosis. He also complains of joint pain in his neck, in both shoulders. He has pain with abduction in his left shoulder greater than 90. He has crepitus in his left shoulder. He has pain with internal Rotation of the left shoulder. He has a slightly positive empty can sign his left shoulder. He has a positive Hawkins sign. He has pain with range of motion in the right shoulder.Marland Kitchen He also has crepitus in his cervical spine. He also complains of numbness in his left leg which is chronic for the last 3 years. He has a history of degenerative disc disease with a bulging disc in his lumbar spine. He received 3 cortisone injections for that and then was referred to a neurologist by the orthopedic office. He stopped following up as the symptoms did not improve. He is due for a flu shot today as well as Pneumovax 23.  At that time, my plan was: Lab work is unremarkable. Cholesterol has gone up slightly. I recommended a low fat diet, low saturated fat diet, and decreased consumption of meat, and sweets. I recommended more fresh fruits and vegetables. A believe the pain in his neck and in his shoulders are likely due to osteoarthritis up with possible super spin not as tendinitis in his left shoulder. I recommended trying meloxicam 15 mg by mouth daily and pain is no better in one month's time, I will proceed with x-rays of the shoulders and consider a cortisone injection in the  left shoulder. I believe his chronic lower abdominal pain is likely IBS versus diverticulosis. I'm not able to spend adequate time discussing all of these issues at once and perform a physical today and therefore I have recommended that he follow back up with his gastroenterologist if he continues to have abdominal pain. Apparently, linzess did not help. He denies any chronic constipation, blood in his stool, or chronic diarrhea. Pain has been present now for almost 6 years and therefore I  believe is more likely IBS. He received his flu shot as well as Pneumovax 23 today. His PSA is slightly elevated but this is stable compared to the PSA he had checked at his urologist office in September. He follows up with his urologist later this year. 07/17/16 Patient is here today complaining of abdominal pain. He states that for the last week he has had a burning discomfort in the epigastric area. It seems to improve with ranitidine. He denies any black tarry stools or blood in his stools. He does report some nauseousness. He also reports a lot of bloating and "pressure". He denies any vomiting. He denies any diarrhea. He does have some constipation. When I asked him to clarify he states that he goes to the bathroom one or 2 times a day and has a normal bowel movement. He denies any true constipation. He denies any fevers or chills. He continues to complain of pain in both shoulders left worse than right. He has pain with abduction greater than 90 as well as internal and external rotation. He has a positive empty can sign.  At that time, my plan was: I believe the patient's abdominal pain is a combination of irritable bowel syndrome coupled with gastroesophageal reflux disease. I recommended that he discontinue meloxicam which is been taking for shoulder. Begin Nexium 40 mg a day. Start a probiotic 1 tablet daily for the bloating and IBS. Recheck in one month. For the patient shoulder pain, I believe he is having super stenotic tendinitis/bursitis. Using sterile technique, I injected the left shoulder with 2 mL of lidocaine, 2 mL of Marcaine, and 2 mL of 40 mg per mL Kenalog. The patient tolerated the procedure well without complication  17/6/16 Patient never got the Nexium. He did take a probiotic for one month. He saw no benefit in his abdominal pain. He returns today complaining of abdominal pain. Now it is in the right lower quadrant. It radiates from the right lower quadrant into his back. The pain is  very vague in nature and is very difficult for the patient to describe. He denies any blood in his stools or black tarry stools. He denies any nausea or vomiting or diarrhea. He denies any weight loss. His abdominal pain has been migratory in nature for many years with no abnormal findings on exam. However in August and mushrooms causing his pain. Now he denies any reflux. He denies any heartburn. The pain is no longer epigastric in nature.  At that time, my plan was: Urinalysis shows no specific cause of his right lower quadrant pain. There is no evidence of urinary tract infection. I will check a CBC and CMP. I will try the patient empirically on Linzess 290 mcg poqday for possible IBS with pain and constipation.  If no better, proceed with CT of abd/pelvis and GI consult.  07/05/17 CT scan was unremarkable. No cause for the patient's abdominal pain was found. He is here today again complaining of chronic bilateral lower abdominal pain and communication  is always difficult. However he states that when he wakes up in the morning, his stomach rumbles and makes lots of sounds. During the day, the symptoms will get better and go away. At night, he will report bloating and pressure in his lower abdomen. He does not take the probiotic. He does not take linzess.  He has stopped nexium.  He denies any blood in his stool. He denies any melena. He denies any hematochezia. He is due for a colonoscopy.  At that time, my plan was: Lab work is unremarkable. I did recommend a low carbohydrate diet and increasing aerobic exercise. I believe the patient has irritable bowel syndrome constipation predominant. He states that he can go days without having a bowel movement. I believe this likely irritates his irritable bowel. I tried to explain to the patient that there is no cure for irritable bowel. The symptoms have been present for years. I have seen the patient several times in the last 5-10 years discussing these exact same  symptoms. As I explained to the patient, while there is no cure, we can try to control them as best we can. This can be done through a combination of medicines for constipation, medicines for heartburn, probiotics, and medicines for intestinal spasms. I would like to start by placing the patient on Linzess 74 mcg poqday and have him be compliant with this for a prolonged period of time to see if the symptoms improve. I believe constipation is an exacerbating factor. I would also, consult GI for colonoscopy. Consider adding probiotics and anti-spasmodics if symptoms do not improve.  06/17/18  Patient did not meet with his gastroenterologist last year.  He quit taking the Linzess after 1 month.  He states that it helps some however he cannot afford it.  At the present time he is only taking Flomax.  He is also taking a probiotic.  He states for the last 2 months, he has had pressure.  The pressure is located in in the right lower quadrant in the left lower quadrant.  It occurs on a daily basis.  He states that it occurs in the mornings and in the evenings.  While he is busy at work or occupied, he does not notice the pain of the pressure.  Defecation helps relieve the pressure and improves the pain.  He states that he also is having some constipation.  He can go 1 to 2 days without having a bowel movement.  He denies any nausea.  He denies any vomiting.  He denies any melena.  He denies any hematochezia.  He denies any hematemesis.  He denies any weight loss.  He denies any fever.  He also reports increased rumbling in his abdomen in the mornings and in the evenings.  I discussed this with the patient and mentioned that these are the same symptoms he has had now for many years.  Patient agrees that the symptoms have not changed.  They seem to be benign functional symptoms that are chronic.  At that time, my plan was: I will try the patient on Motegrity 2 mg daily.  I hope that this will help improve his  constipation, improve his peristalsis, and relieve his bloating and abdominal pressure.  Reassess the patient in 2 weeks to see if symptoms have improved.  Consider referral to GI if symptoms are persistent.  07/01/18  Patient believes the motegrity may have helped some.  He is now going to the bathroom every day and having regular  bowel movements.  He continues to endorse a pressure-like sensation in his lower abdomen every morning.  It is particularly worse at night and first thing in the morning.  Once he gets up and begins to move around, the pressure-like discomfort goes away.  Since I last saw him, he does report some mild dysuria and increased urinary frequency and would like to obtain a urinalysis.  I went over the FODMAP diet with the patient.  He does consume cereal, toast, and milk every morning.  Perhaps these carbohydrates are contributing some to his bloating and pressure and heaviness.  His supper, seems more nebulous.  He states that he eats some type of soup almost every night is usually some type of chicken broth.  I do not believe this is playing a role.  However I did provide the patient a handout on this diet and recommended that he avoid foods that are on the fodmap diet.  At that time, my plan was: Urinalysis was unremarkable.  He already has an appointment for later this summer to see his urologist to follow-up his elevated PSA which is in the 5 range.  I see no indication for urinary tract infection.  I believe the patient's symptoms are due to irritable bowel.  He is noticed some improvement on Motegrity.  We will continue this at the present time.  I provided him a handout on FODMAP and have encouraged him to avoid foods rich in these carbohydrates that may contribute to the abdominal bloating and pressure.  08/09/20 Patient had to discontinue Linzess due to cost.  He states that he is going every 2 to 3 days to have a bowel movement.  He states over the last 3 weeks he has had an  increasing pressure in his pelvis.  He reports lower abdominal discomfort and fullness and bloating.  He denies any nausea or vomiting.  He denies any melena or hematochezia.  He denies any fevers or chills.  He recently saw his urologist for prostate exam.  They had discussed a TURP however he declined that.  He believes that constipation may be playing a role in the pressure-like sensation in his lower pelvis Past Medical History:  Diagnosis Date  . BPH with elevated PSA   . Diabetes mellitus type 2 in nonobese (HCC)   . Diverticulosis   . Erectile dysfunction   . Esophageal stricture   . GERD (gastroesophageal reflux disease)   . Hiatal hernia   . HLD (hyperlipidemia)    Past Surgical History:  Procedure Laterality Date  . PROSTATE SURGERY     Current Outpatient Medications on File Prior to Visit  Medication Sig Dispense Refill  . fexofenadine (ALLEGRA) 180 MG tablet Take 180 mg by mouth daily.    . finasteride (PROSCAR) 5 MG tablet     . fluticasone (FLONASE) 50 MCG/ACT nasal spray Place 2 sprays into both nostrils daily. (Patient taking differently: Place 1 spray into both nostrils daily. ) 16 g 6  . meloxicam (MOBIC) 15 MG tablet Take 1 tablet (15 mg total) by mouth daily. Stop diclofenac 30 tablet 3  . metFORMIN (GLUCOPHAGE) 500 MG tablet TAKE 1 TABLET BY MOUTH TWICE DAILY WITH A MEAL 180 tablet 2  . pantoprazole (PROTONIX) 40 MG tablet Take 1 tablet (40 mg total) by mouth daily. 30 tablet 3  . pravastatin (PRAVACHOL) 20 MG tablet Take 1 tablet (20 mg total) by mouth daily. 90 tablet 3  . tamsulosin (FLOMAX) 0.4 MG CAPS capsule Take  0.4 mg by mouth.     No current facility-administered medications on file prior to visit.   Allergies  Allergen Reactions  . Penicillins    Social History   Socioeconomic History  . Marital status: Single    Spouse name: Not on file  . Number of children: Not on file  . Years of education: Not on file  . Highest education level: Not on file    Occupational History  . Not on file  Tobacco Use  . Smoking status: Never Smoker  . Smokeless tobacco: Never Used  Substance and Sexual Activity  . Alcohol use: No  . Drug use: No  . Sexual activity: Yes    Comment: married, bus driver  Other Topics Concern  . Not on file  Social History Narrative  . Not on file   Social Determinants of Health   Financial Resource Strain:   . Difficulty of Paying Living Expenses: Not on file  Food Insecurity:   . Worried About Programme researcher, broadcasting/film/video in the Last Year: Not on file  . Ran Out of Food in the Last Year: Not on file  Transportation Needs:   . Lack of Transportation (Medical): Not on file  . Lack of Transportation (Non-Medical): Not on file  Physical Activity:   . Days of Exercise per Week: Not on file  . Minutes of Exercise per Session: Not on file  Stress:   . Feeling of Stress : Not on file  Social Connections:   . Frequency of Communication with Friends and Family: Not on file  . Frequency of Social Gatherings with Friends and Family: Not on file  . Attends Religious Services: Not on file  . Active Member of Clubs or Organizations: Not on file  . Attends Banker Meetings: Not on file  . Marital Status: Not on file  Intimate Partner Violence:   . Fear of Current or Ex-Partner: Not on file  . Emotionally Abused: Not on file  . Physically Abused: Not on file  . Sexually Abused: Not on file   Family History  Problem Relation Age of Onset  . Heart disease Brother   . Stroke Brother      Review of Systems  Gastrointestinal: Positive for abdominal pain.  All other systems reviewed and are negative.      Objective:   Physical Exam Vitals reviewed.  Constitutional:      General: He is not in acute distress.    Appearance: He is well-developed. He is not diaphoretic.  HENT:     Head: Normocephalic and atraumatic.     Right Ear: External ear normal.     Left Ear: External ear normal.     Nose: Nose  normal.     Mouth/Throat:     Pharynx: No oropharyngeal exudate.  Eyes:     General: No scleral icterus.       Right eye: No discharge.        Left eye: No discharge.     Conjunctiva/sclera: Conjunctivae normal.     Pupils: Pupils are equal, round, and reactive to light.  Neck:     Thyroid: No thyromegaly.     Vascular: No JVD.     Trachea: No tracheal deviation.  Cardiovascular:     Rate and Rhythm: Normal rate and regular rhythm.     Heart sounds: Normal heart sounds. No murmur heard.  No friction rub. No gallop.   Pulmonary:     Effort: Pulmonary effort  is normal. No respiratory distress.     Breath sounds: Normal breath sounds. No stridor. No wheezing or rales.  Chest:     Chest wall: No tenderness.  Abdominal:     General: Bowel sounds are decreased. There is no distension.     Palpations: Abdomen is soft. There is no mass.     Tenderness: There is no abdominal tenderness. There is no guarding or rebound.    Musculoskeletal:     Cervical back: Normal range of motion and neck supple.  Lymphadenopathy:     Cervical: No cervical adenopathy.  Skin:    General: Skin is warm.     Coloration: Skin is not pale.     Findings: No erythema or rash.  Neurological:     Mental Status: He is alert and oriented to person, place, and time.     Cranial Nerves: No cranial nerve deficit.     Motor: No abnormal muscle tone.     Coordination: Coordination normal.     Deep Tendon Reflexes: Reflexes are normal and symmetric.  Psychiatric:        Behavior: Behavior normal.        Thought Content: Thought content normal.        Judgment: Judgment normal.          Assessment & Plan:   BPH with elevated PSA  Lower abdominal pain of unknown etiology  Patient has a chronic history of vague lower abdominal pain.  I believe he most likely has IBS as we have discussed previously.  In this specific instance, the pressure could be either related to urinary retention from BPH or to  constipation.  Patient will try Senokot-S, 2 tablets daily for constipation to see if this relieves the pressure.  He declined a rectal exam today to allow me to evaluate the size of his prostate.  His bladder is not palpable today on exam although when palpating near the bladder he does report some discomfort.  I believe this is likely multifactorial and related to BPH along with constipation.  Reassess in 2 weeks

## 2020-08-13 ENCOUNTER — Telehealth: Payer: Self-pay | Admitting: *Deleted

## 2020-08-13 NOTE — Telephone Encounter (Signed)
-----   Message from Durwin Nora Avik Leoni, LPN sent at 0/08/8118 11:14 AM EDT -----  ----- Message ----- From: Donita Brooks, MD Sent: 08/09/2020   2:39 PM EDT To: Roxy Cedar, CMA  Schedule for cologuard

## 2020-08-13 NOTE — Telephone Encounter (Signed)
Received verbal orders for Cologuard.   Order placed via [facility].  

## 2020-08-19 DIAGNOSIS — Z1212 Encounter for screening for malignant neoplasm of rectum: Secondary | ICD-10-CM | POA: Diagnosis not present

## 2020-08-19 DIAGNOSIS — Z1211 Encounter for screening for malignant neoplasm of colon: Secondary | ICD-10-CM | POA: Diagnosis not present

## 2020-08-23 LAB — COLOGUARD: Cologuard: NEGATIVE

## 2020-08-29 NOTE — Telephone Encounter (Signed)
Received the results of Cologuard screening.   Screening noted negative.   A negative result indicates a low likelihood of colorectal cancer is present. Following a negative Cologuard result, the [employer] Society recommends a Cologuard re-screening interval of 3 years.   Letter sent.   

## 2020-09-12 ENCOUNTER — Other Ambulatory Visit: Payer: Self-pay | Admitting: Family Medicine

## 2020-09-12 MED ORDER — PRAVASTATIN SODIUM 20 MG PO TABS: 20.0000 mg | ORAL_TABLET | Freq: Every day | ORAL | 3 refills | Status: DC

## 2020-09-12 NOTE — Telephone Encounter (Signed)
Prescription sent to pharmacy.

## 2020-09-12 NOTE — Telephone Encounter (Signed)
Pharmacy:[facility] [address] [place] - [address]   Medication:pravastatin (PRAVACHOL) 20 MG tablet   Qty: 90  HYI:FOYD 1 tablet (20 mg total) by mouth daily.  Physician:Dr. Tanya Nones  Per request this is the 7th one, and last fill date was 05/24/2020

## 2020-09-17 DIAGNOSIS — R69 Illness, unspecified: Secondary | ICD-10-CM | POA: Diagnosis not present

## 2020-10-08 ENCOUNTER — Ambulatory Visit: Payer: Medicare HMO | Attending: Internal Medicine

## 2020-10-08 DIAGNOSIS — Z23 Encounter for immunization: Secondary | ICD-10-CM

## 2020-10-08 NOTE — Progress Notes (Signed)
   Covid-19 Vaccination Clinic  Name:  Bryan Mccullough    MRN: [id number] DOB: 1941-03-29  10/08/2020  [name] was observed post Covid-19 immunization for 30 minutes based on pre-vaccination screening without incident. He was provided with Vaccine Information Sheet and instruction to access the V-Safe system.   [name] was instructed to call 911 with any severe reactions post vaccine: Marland Kitchen Difficulty breathing  . Swelling of face and throat  . A fast heartbeat  . A bad rash all over body  . Dizziness and weakness

## 2020-11-11 ENCOUNTER — Other Ambulatory Visit: Payer: Self-pay

## 2020-11-11 ENCOUNTER — Ambulatory Visit (INDEPENDENT_AMBULATORY_CARE_PROVIDER_SITE_OTHER): Payer: Medicare HMO | Admitting: Family Medicine

## 2020-11-11 VITALS — BP 126/74 | HR 83 | Temp 97.8°F | Resp 15 | Ht 68.0 in | Wt 142.0 lb

## 2020-11-11 DIAGNOSIS — E119 Type 2 diabetes mellitus without complications: Secondary | ICD-10-CM

## 2020-11-11 DIAGNOSIS — G8929 Other chronic pain: Secondary | ICD-10-CM | POA: Diagnosis not present

## 2020-11-11 DIAGNOSIS — R1030 Lower abdominal pain, unspecified: Secondary | ICD-10-CM

## 2020-11-11 DIAGNOSIS — M25512 Pain in left shoulder: Secondary | ICD-10-CM | POA: Diagnosis not present

## 2020-11-11 MED ORDER — HYDROCORTISONE 2.5 % EX CREA: TOPICAL_CREAM | Freq: Two times a day (BID) | CUTANEOUS | 0 refills | Status: DC

## 2020-11-11 NOTE — Progress Notes (Signed)
Subjective:    Patient ID: Bryan Mccullough, male    DOB: 06/06/1941, 79 y.o.   MRN: [id number]  Abdominal Pain    06/17/15 Patient is reporting vague abdominal pain.  It is located in the right lower quadrant and the left lower quadrant. It has been there for several years. He is seeing GI who performed a colonoscopy which was significant only for diverticulosis and internal hemorrhoids. He sees a urologist for BPH and has his prostate checked annually. They also check his PSA.  Patient had similar pain dating all the way back to 2011. I checked a CT scan at that time which revealed no abnormalities other than a thick walled bladder and enlarged prostate. The pain has not changed. It occurs on a daily basis. It is a bloating feeling that tends to improve with bowel movements. Patient states that he is not having a bowel movement but only every 3 days. They tend to be hard. He denies any melanoma or hematochezia. He denies any fevers or chills. He denies any nausea or vomiting or bilious emesis. He denies any weight loss. He denies any rash. He does continue have mild constipation. Movement does not exacerbate the pain. At that time,my plan was: Patient's lab work is excellent. I believe the patient's abdominal pain which is chronic may be IBS versus diverticular pain related to chronic constipation. Therefore I will start the patient on linzess 145 mcg poqday and recheck in 2 weeks. 01/14/16 Patient is here today for complete physical exam. He continues to report lower abdominal pain that has been chronic and intermittent for the last 6 years. His last colonoscopy was in 2012. At that time his colonoscopy was significant only for diverticulosis. He has not had any further workup aside from a normal colonoscopy normal lab work normal CT scan and urology referral. However the pain is vague. There is no particular exacerbating or alleviating factors. It tends to be located in the left lower quadrant.  Certainly IBS versus diverticulosis are leading possible causes , differential diagnosis. He also complains of joint pain in his neck, in both shoulders. He has pain with abduction in his left shoulder greater than 90. He has crepitus in his left shoulder. He has pain with internal Rotation of the left shoulder. He has a slightly positive empty can sign his left shoulder. He has a positive Hawkins sign. He has pain with range of motion in the right shoulder.Marland Kitchen He also has crepitus in his cervical spine. He also complains of numbness in his left leg which is chronic for the last 3 years. He has a history of degenerative disc disease with a bulging disc in his lumbar spine. He received 3 cortisone injections for that and then was referred to a neurologist by the orthopedic office. He stopped following up as the symptoms did not improve. He is due for a flu shot today as well as Pneumovax 23.  At that time, my plan was: Lab work is unremarkable. Cholesterol has gone up slightly. I recommended a low fat diet, low saturated fat diet, and decreased consumption of meat, and sweets. I recommended more fresh fruits and vegetables. A believe the pain in his neck and in his shoulders are likely due to osteoarthritis up with possible super spin not as tendinitis in his left shoulder. I recommended trying meloxicam 15 mg by mouth daily and pain is no better in one month's time, I will proceed with x-rays of the shoulders and consider a cortisone  injection in the left shoulder. I believe his chronic lower abdominal pain is likely IBS versus diverticulosis. I'm not able to spend adequate time discussing all of these issues at once and perform a physical today and therefore I have recommended that he follow back up with his gastroenterologist if he continues to have abdominal pain. Apparently, linzess did not help. He denies any chronic constipation, blood in his stool, or chronic diarrhea. Pain has been present now for almost 6  years and therefore I believe is more likely IBS. He received his flu shot as well as Pneumovax 23 today. His PSA is slightly elevated but this is stable compared to the PSA he had checked at his urologist office in September. He follows up with his urologist later this year. 07/17/16 Patient is here today complaining of abdominal pain. He states that for the last week he has had a burning discomfort in the epigastric area. It seems to improve with ranitidine. He denies any black tarry stools or blood in his stools. He does report some nauseousness. He also reports a lot of bloating and "pressure". He denies any vomiting. He denies any diarrhea. He does have some constipation. When I asked him to clarify he states that he goes to the bathroom one or 2 times a day and has a normal bowel movement. He denies any true constipation. He denies any fevers or chills. He continues to complain of pain in both shoulders left worse than right. He has pain with abduction greater than 90 as well as internal and external rotation. He has a positive empty can sign.  At that time, my plan was: I believe the patient's abdominal pain is a combination of irritable bowel syndrome coupled with gastroesophageal reflux disease. I recommended that he discontinue meloxicam which is been taking for shoulder. Begin Nexium 40 mg a day. Start a probiotic 1 tablet daily for the bloating and IBS. Recheck in one month. For the patient shoulder pain, I believe he is having super stenotic tendinitis/bursitis. Using sterile technique, I injected the left shoulder with 2 mL of lidocaine, 2 mL of Marcaine, and 2 mL of 40 mg per mL Kenalog. The patient tolerated the procedure well without complication  09/11/16 Patient never got the Nexium. He did take a probiotic for one month. He saw no benefit in his abdominal pain. He returns today complaining of abdominal pain. Now it is in the right lower quadrant. It radiates from the right lower quadrant into  his back. The pain is very vague in nature and is very difficult for the patient to describe. He denies any blood in his stools or black tarry stools. He denies any nausea or vomiting or diarrhea. He denies any weight loss. His abdominal pain has been migratory in nature for many years with no abnormal findings on exam. However in August and mushrooms causing his pain. Now he denies any reflux. He denies any heartburn. The pain is no longer epigastric in nature.  At that time, my plan was: Urinalysis shows no specific cause of his right lower quadrant pain. There is no evidence of urinary tract infection. I will check a CBC and CMP. I will try the patient empirically on Linzess 290 mcg poqday for possible IBS with pain and constipation.  If no better, proceed with CT of abd/pelvis and GI consult.  07/05/17 CT scan was unremarkable. No cause for the patient's abdominal pain was found. He is here today again complaining of chronic bilateral lower abdominal  pain and communication is always difficult. However he states that when he wakes up in the morning, his stomach rumbles and makes lots of sounds. During the day, the symptoms will get better and go away. At night, he will report bloating and pressure in his lower abdomen. He does not take the probiotic. He does not take linzess.  He has stopped nexium.  He denies any blood in his stool. He denies any melena. He denies any hematochezia. He is due for a colonoscopy.  At that time, my plan was: Lab work is unremarkable. I did recommend a low carbohydrate diet and increasing aerobic exercise. I believe the patient has irritable bowel syndrome constipation predominant. He states that he can go days without having a bowel movement. I believe this likely irritates his irritable bowel. I tried to explain to the patient that there is no cure for irritable bowel. The symptoms have been present for years. I have seen the patient several times in the last 5-10 years  discussing these exact same symptoms. As I explained to the patient, while there is no cure, we can try to control them as best we can. This can be done through a combination of medicines for constipation, medicines for heartburn, probiotics, and medicines for intestinal spasms. I would like to start by placing the patient on Linzess 74 mcg poqday and have him be compliant with this for a prolonged period of time to see if the symptoms improve. I believe constipation is an exacerbating factor. I would also, consult GI for colonoscopy. Consider adding probiotics and anti-spasmodics if symptoms do not improve.  06/17/18  Patient did not meet with his gastroenterologist last year.  He quit taking the Linzess after 1 month.  He states that it helps some however he cannot afford it.  At the present time he is only taking Flomax.  He is also taking a probiotic.  He states for the last 2 months, he has had pressure.  The pressure is located in in the right lower quadrant in the left lower quadrant.  It occurs on a daily basis.  He states that it occurs in the mornings and in the evenings.  While he is busy at work or occupied, he does not notice the pain of the pressure.  Defecation helps relieve the pressure and improves the pain.  He states that he also is having some constipation.  He can go 1 to 2 days without having a bowel movement.  He denies any nausea.  He denies any vomiting.  He denies any melena.  He denies any hematochezia.  He denies any hematemesis.  He denies any weight loss.  He denies any fever.  He also reports increased rumbling in his abdomen in the mornings and in the evenings.  I discussed this with the patient and mentioned that these are the same symptoms he has had now for many years.  Patient agrees that the symptoms have not changed.  They seem to be benign functional symptoms that are chronic.  At that time, my plan was: I will try the patient on Motegrity 2 mg daily.  I hope that this will  help improve his constipation, improve his peristalsis, and relieve his bloating and abdominal pressure.  Reassess the patient in 2 weeks to see if symptoms have improved.  Consider referral to GI if symptoms are persistent.  07/01/18  Patient believes the motegrity may have helped some.  He is now going to the bathroom every day  and having regular bowel movements.  He continues to endorse a pressure-like sensation in his lower abdomen every morning.  It is particularly worse at night and first thing in the morning.  Once he gets up and begins to move around, the pressure-like discomfort goes away.  Since I last saw him, he does report some mild dysuria and increased urinary frequency and would like to obtain a urinalysis.  I went over the FODMAP diet with the patient.  He does consume cereal, toast, and milk every morning.  Perhaps these carbohydrates are contributing some to his bloating and pressure and heaviness.  His supper, seems more nebulous.  He states that he eats some type of soup almost every night is usually some type of chicken broth.  I do not believe this is playing a role.  However I did provide the patient a handout on this diet and recommended that he avoid foods that are on the fodmap diet.  At that time, my plan was: Urinalysis was unremarkable.  He already has an appointment for later this summer to see his urologist to follow-up his elevated PSA which is in the 5 range.  I see no indication for urinary tract infection.  I believe the patient's symptoms are due to irritable bowel.  He is noticed some improvement on Motegrity.  We will continue this at the present time.  I provided him a handout on FODMAP and have encouraged him to avoid foods rich in these carbohydrates that may contribute to the abdominal bloating and pressure.  08/09/20 Patient had to discontinue Linzess due to cost.  He states that he is going every 2 to 3 days to have a bowel movement.  He states over the last 3 weeks  he has had an increasing pressure in his pelvis.  He reports lower abdominal discomfort and fullness and bloating.  He denies any nausea or vomiting.  He denies any melena or hematochezia.  He denies any fevers or chills.  He recently saw his urologist for prostate exam.  They had discussed a TURP however he declined that.  He believes that constipation may be playing a role in the pressure-like sensation in his lower pelvis.  At that time, my plan was: Patient has a chronic history of vague lower abdominal pain.  I believe he most likely has IBS as we have discussed previously.  In this specific instance, the pressure could be either related to urinary retention from BPH or to constipation.  Patient will try Senokot-S, 2 tablets daily for constipation to see if this relieves the pressure.  He declined a rectal exam today to allow me to evaluate the size of his prostate.  His bladder is not palpable today on exam although when palpating near the bladder he does report some discomfort.  I believe this is likely multifactorial and related to BPH along with constipation.  Reassess in 2 weeks  11/11/20 Patient is not taking Senokot.  Apparently this was not covered by his insurance.  Instead he is taking something else that he bought at the pharmacy which he describes as a brown pill.  I am not sure what that would be.  He states that he has a bowel movement every 2 or 3 days.  On days when he does not have a bowel movement he feels pressure in his lower abdomen.  He also reports near constant abdominal discomfort.  It is possible that he is taking senna.  He denies any blood in his  stool or melena.  Cologuard came back negative earlier in September.  Rectal exam was performed today and reveals no palpable stool in the rectal vault.  There is no palpable perirectal mass or palpable hemorrhoid.  There is no visible external hemorrhoid.  Patient is concerned that he may have a hemorrhoid because it itches after he  wipes occasionally.  There is no visible hemorrhoid today on exam.  He also complains of bilateral shoulder pain left greater than right.  Work-up has included a normal sed rate which I believe helps to exclude PMR.  Has had x-rays of both shoulders which showed no significant degenerative joint changes.  He has a negative empty can sign bilaterally today.  He has negative Hawking's maneuver bilaterally.  He has no pain with abduction.  He has no pain with internal or external rotation.  The pain is a dull aching pain over the lateral aspect of the shoulder but there is no instability on exam.  Mobic does not seem to be helping. Past Medical History:  Diagnosis Date  . BPH with elevated PSA   . Diabetes mellitus type 2 in nonobese (HCC)   . Diverticulosis   . Erectile dysfunction   . Esophageal stricture   . GERD (gastroesophageal reflux disease)   . Hiatal hernia   . HLD (hyperlipidemia)    Past Surgical History:  Procedure Laterality Date  . PROSTATE SURGERY     Current Outpatient Medications on File Prior to Visit  Medication Sig Dispense Refill  . fexofenadine (ALLEGRA) 180 MG tablet Take 180 mg by mouth daily.    . finasteride (PROSCAR) 5 MG tablet     . fluticasone (FLONASE) 50 MCG/ACT nasal spray Place 2 sprays into both nostrils daily. (Patient taking differently: Place 1 spray into both nostrils daily. ) 16 g 6  . meloxicam (MOBIC) 15 MG tablet Take 1 tablet (15 mg total) by mouth daily. Stop diclofenac 30 tablet 3  . metFORMIN (GLUCOPHAGE) 500 MG tablet TAKE 1 TABLET BY MOUTH TWICE DAILY WITH A MEAL 180 tablet 2  . pantoprazole (PROTONIX) 40 MG tablet Take 1 tablet (40 mg total) by mouth daily. 30 tablet 3  . pravastatin (PRAVACHOL) 20 MG tablet Take 1 tablet (20 mg total) by mouth daily. 90 tablet 3  . senna-docusate (SENOKOT-S) 8.6-50 MG tablet Take 2 tablets by mouth daily. 60 tablet 3  . tamsulosin (FLOMAX) 0.4 MG CAPS capsule Take 0.4 mg by mouth.     No current  facility-administered medications on file prior to visit.   Allergies  Allergen Reactions  . Penicillins    Social History   Socioeconomic History  . Marital status: Single    Spouse name: Not on file  . Number of children: Not on file  . Years of education: Not on file  . Highest education level: Not on file  Occupational History  . Not on file  Tobacco Use  . Smoking status: Never Smoker  . Smokeless tobacco: Never Used  Substance and Sexual Activity  . Alcohol use: No  . Drug use: No  . Sexual activity: Yes    Comment: married, bus driver  Other Topics Concern  . Not on file  Social History Narrative  . Not on file   Social Determinants of Health   Financial Resource Strain:   . Difficulty of Paying Living Expenses: Not on file  Food Insecurity:   . Worried About Programme researcher, broadcasting/film/video in the Last Year: Not on file  .  Ran Out of Food in the Last Year: Not on file  Transportation Needs:   . Lack of Transportation (Medical): Not on file  . Lack of Transportation (Non-Medical): Not on file  Physical Activity:   . Days of Exercise per Week: Not on file  . Minutes of Exercise per Session: Not on file  Stress:   . Feeling of Stress : Not on file  Social Connections:   . Frequency of Communication with Friends and Family: Not on file  . Frequency of Social Gatherings with Friends and Family: Not on file  . Attends Religious Services: Not on file  . Active Member of Clubs or Organizations: Not on file  . Attends Banker Meetings: Not on file  . Marital Status: Not on file  Intimate Partner Violence:   . Fear of Current or Ex-Partner: Not on file  . Emotionally Abused: Not on file  . Physically Abused: Not on file  . Sexually Abused: Not on file   Family History  Problem Relation Age of Onset  . Heart disease Brother   . Stroke Brother      Review of Systems  Gastrointestinal: Positive for abdominal pain.  All other systems reviewed and are  negative.      Objective:   Physical Exam Vitals reviewed.  Constitutional:      General: He is not in acute distress.    Appearance: He is well-developed. He is not diaphoretic.  HENT:     Head: Normocephalic and atraumatic.     Right Ear: External ear normal.     Left Ear: External ear normal.     Nose: Nose normal.     Mouth/Throat:     Pharynx: No oropharyngeal exudate.  Eyes:     General: No scleral icterus.       Right eye: No discharge.        Left eye: No discharge.     Conjunctiva/sclera: Conjunctivae normal.     Pupils: Pupils are equal, round, and reactive to light.  Neck:     Thyroid: No thyromegaly.     Vascular: No JVD.     Trachea: No tracheal deviation.  Cardiovascular:     Rate and Rhythm: Normal rate and regular rhythm.     Heart sounds: Normal heart sounds. No murmur heard.  No friction rub. No gallop.   Pulmonary:     Effort: Pulmonary effort is normal. No respiratory distress.     Breath sounds: Normal breath sounds. No stridor. No wheezing or rales.  Chest:     Chest wall: No tenderness.  Abdominal:     General: Bowel sounds are decreased. There is no distension.     Palpations: Abdomen is soft. There is no mass.     Tenderness: There is no abdominal tenderness. There is no guarding or rebound.    Musculoskeletal:     Right shoulder: Tenderness present. No bony tenderness or crepitus. Decreased range of motion. Normal strength.     Left shoulder: Tenderness present. No bony tenderness or crepitus. Decreased range of motion. Normal strength.     Cervical back: Normal range of motion and neck supple.  Lymphadenopathy:     Cervical: No cervical adenopathy.  Skin:    General: Skin is warm.     Coloration: Skin is not pale.     Findings: No erythema or rash.  Neurological:     Mental Status: He is alert and oriented to person, place, and time.  Cranial Nerves: No cranial nerve deficit.     Motor: No abnormal muscle tone.     Coordination:  Coordination normal.     Deep Tendon Reflexes: Reflexes are normal and symmetric.  Psychiatric:        Behavior: Behavior normal.        Thought Content: Thought content normal.        Judgment: Judgment normal.          Assessment & Plan:   Diabetes mellitus type 2 in nonobese (HCC) - Plan: CBC with Differential/Platelet, COMPLETE METABOLIC PANEL WITH GFR, Microalbumin, urine, Hemoglobin A1c  Lower abdominal pain of unknown etiology  Chronic left shoulder pain  At this point, I am loss for options as to what to do for his abdominal pain.  I do believe it is benign given the chronic nature as he has been dealing with the same symptoms now for more than 6 years.  I believe is most likely irritable bowel syndrome exacerbated by constipation.  Therefore I recommended that he try MiraLAX daily as I believe this is his cheapest alternative given the fact he cannot afford the Linzess or the Amitiza and the Senokot or his senna does not seem to be helping.  I did not see a hemorrhoid today on exam.  Rectal exam is completely normal.  I believe he has perirectal irritation occasionally after defecation.  He can certainly use hydrocortisone 2.5% cream occasionally as needed for itching but I do not see any pathology on rectal exam.  I believe the left shoulder pain is likely bursitis in the shoulder although his exam is nonspecific.  Therefore he would like to receive a cortisone injection in the shoulder.  Using sterile technique, I injected the left subacromial space with 2 cc lidocaine, 2 cc of Marcaine, and 2 cc of 40 mg/mL Kenalog.  Patient tolerated the procedure well without complication.

## 2020-11-12 LAB — CBC WITH DIFFERENTIAL/PLATELET
Absolute Monocytes: 578 cells/uL (ref 200–950)
Basophils Absolute: 39 cells/uL (ref 0–200)
Basophils Relative: 0.7 %
Eosinophils Absolute: 138 cells/uL (ref 15–500)
Eosinophils Relative: 2.5 %
HCT: 47.2 % (ref 38.5–50.0)
Hemoglobin: 16.1 g/dL (ref 13.2–17.1)
Lymphs Abs: 2057 cells/uL (ref 850–3900)
MCH: 29.9 pg (ref 27.0–33.0)
MCHC: 34.1 g/dL (ref 32.0–36.0)
MCV: 87.7 fL (ref 80.0–100.0)
MPV: 10.1 fL (ref 7.5–12.5)
Monocytes Relative: 10.5 %
Neutro Abs: 2690 cells/uL (ref 1500–7800)
Neutrophils Relative %: 48.9 %
Platelets: 214 10*3/uL (ref 140–400)
RBC: 5.38 10*6/uL (ref 4.20–5.80)
RDW: 12.2 % (ref 11.0–15.0)
Total Lymphocyte: 37.4 %
WBC: 5.5 10*3/uL (ref 3.8–10.8)

## 2020-11-12 LAB — COMPLETE METABOLIC PANEL WITH GFR
AG Ratio: 1.6 (calc) (ref 1.0–2.5)
ALT: 14 U/L (ref 9–46)
AST: 18 U/L (ref 10–35)
Albumin: 4.5 g/dL (ref 3.6–5.1)
Alkaline phosphatase (APISO): 89 U/L (ref 35–144)
BUN: 21 mg/dL (ref 7–25)
CO2: 30 mmol/L (ref 20–32)
Calcium: 9.7 mg/dL (ref 8.6–10.3)
Chloride: 103 mmol/L (ref 98–110)
Creat: 0.85 mg/dL (ref 0.70–1.18)
GFR, Est African American: 97 mL/min/{1.73_m2} (ref >=60)
GFR, Est Non African American: 84 mL/min/{1.73_m2} (ref >=60)
Globulin: 2.9 g/dL (calc) (ref 1.9–3.7)
Glucose, Bld: 96 mg/dL (ref 65–99)
Potassium: 4 mmol/L (ref 3.5–5.3)
Sodium: 140 mmol/L (ref 135–146)
Total Bilirubin: 0.6 mg/dL (ref 0.2–1.2)
Total Protein: 7.4 g/dL (ref 6.1–8.1)

## 2020-11-12 LAB — HEMOGLOBIN A1C
Hgb A1c MFr Bld: 6.1 % of total Hgb — ABNORMAL HIGH (ref <5.7)
Mean Plasma Glucose: 128 mg/dL
eAG (mmol/L): 7.1 mmol/L

## 2020-11-12 LAB — MICROALBUMIN, URINE: Microalb, Ur: 4.1 mg/dL

## 2020-11-14 DIAGNOSIS — Z0100 Encounter for examination of eyes and vision without abnormal findings: Secondary | ICD-10-CM | POA: Diagnosis not present

## 2020-12-04 DIAGNOSIS — R972 Elevated prostate specific antigen [PSA]: Secondary | ICD-10-CM | POA: Diagnosis not present

## 2020-12-04 DIAGNOSIS — N401 Enlarged prostate with lower urinary tract symptoms: Secondary | ICD-10-CM | POA: Diagnosis not present

## 2020-12-04 DIAGNOSIS — R350 Frequency of micturition: Secondary | ICD-10-CM | POA: Diagnosis not present

## 2021-01-13 ENCOUNTER — Telehealth: Payer: Self-pay

## 2021-01-13 NOTE — Telephone Encounter (Signed)
Received fax from pharmacy regarding metformin. Pt is taking 1 po daily at mealtime instead of bid. Called pt to confirm sig. VM full. Requesting new rx for insurance purposes

## 2021-01-22 ENCOUNTER — Other Ambulatory Visit: Payer: Self-pay

## 2021-01-22 MED ORDER — METFORMIN HCL 500 MG PO TABS: ORAL_TABLET | ORAL | 2 refills | Status: DC

## 2021-01-22 MED ORDER — FINASTERIDE 5 MG PO TABS: 5.0000 mg | ORAL_TABLET | Freq: Every day | ORAL | 1 refills | Status: DC

## 2021-03-05 DIAGNOSIS — R350 Frequency of micturition: Secondary | ICD-10-CM | POA: Diagnosis not present

## 2021-03-05 DIAGNOSIS — R972 Elevated prostate specific antigen [PSA]: Secondary | ICD-10-CM | POA: Diagnosis not present

## 2021-03-05 DIAGNOSIS — N401 Enlarged prostate with lower urinary tract symptoms: Secondary | ICD-10-CM | POA: Diagnosis not present

## 2021-03-17 DIAGNOSIS — N401 Enlarged prostate with lower urinary tract symptoms: Secondary | ICD-10-CM | POA: Diagnosis not present

## 2021-03-17 DIAGNOSIS — R350 Frequency of micturition: Secondary | ICD-10-CM | POA: Diagnosis not present

## 2021-04-30 NOTE — Progress Notes (Signed)
Subjective:   Bryan Mccullough is a 80 y.o. male who presents for Medicare Annual/Subsequent preventive examination.  Review of Systems    N/A Cardiac Risk Factors include: advanced age (>4755men, 50>65 women);male gender;dyslipidemia;diabetes mellitus     Objective:    Today's Vitals   05/01/21 1433 05/01/21 1439  BP: (!) 104/54   Temp: 98.1 F (36.7 C)   TempSrc: Oral   Weight: 142 lb 2 oz (64.5 kg)   Height: 5\' 8"  (1.727 m)   PainSc:  7    Body mass index is 21.61 kg/m.  Advanced Directives 05/01/2021  Does Patient Have a Medical Advance Directive? No  Would patient like information on creating a medical advance directive? No - Patient declined    Current Medications (verified) Outpatient Encounter Medications as of 05/01/2021  Medication Sig  . fexofenadine (ALLEGRA) 180 MG tablet Take 180 mg by mouth daily.  . fluticasone (FLONASE) 50 MCG/ACT nasal spray Place 2 sprays into both nostrils daily. (Patient taking differently: Place 1 spray into both nostrils daily.)  . hydrocortisone 2.5 % cream Apply topically 2 (two) times daily.  . metFORMIN (GLUCOPHAGE) 500 MG tablet TAKE 1 TABLET BY MOUTH TWICE DAILY WITH A MEAL  . pravastatin (PRAVACHOL) 20 MG tablet Take 1 tablet (20 mg total) by mouth daily.  Marland Kitchen. senna-docusate (SENOKOT-S) 8.6-50 MG tablet Take 2 tablets by mouth daily.  . tamsulosin (FLOMAX) 0.4 MG CAPS capsule Take 0.4 mg by mouth.  . finasteride (PROSCAR) 5 MG tablet Take 1 tablet (5 mg total) by mouth daily. (Patient not taking: Reported on 05/01/2021)  . meloxicam (MOBIC) 15 MG tablet Take 1 tablet (15 mg total) by mouth daily. Stop diclofenac (Patient not taking: Reported on 05/01/2021)  . pantoprazole (PROTONIX) 40 MG tablet Take 1 tablet (40 mg total) by mouth daily. (Patient not taking: Reported on 05/01/2021)   No facility-administered encounter medications on file as of 05/01/2021.    Allergies (verified) Penicillins   History: Past Medical History:   Diagnosis Date  . BPH with elevated PSA   . Diabetes mellitus type 2 in nonobese (HCC)   . Diverticulosis   . Erectile dysfunction   . Esophageal stricture   . GERD (gastroesophageal reflux disease)   . Hiatal hernia   . HLD (hyperlipidemia)    Past Surgical History:  Procedure Laterality Date  . PROSTATE SURGERY     Family History  Problem Relation Age of Onset  . Heart disease Brother   . Stroke Brother    Social History   Socioeconomic History  . Marital status: Single    Spouse name: Not on file  . Number of children: Not on file  . Years of education: Not on file  . Highest education level: Not on file  Occupational History  . Not on file  Tobacco Use  . Smoking status: Never Smoker  . Smokeless tobacco: Never Used  Substance and Sexual Activity  . Alcohol use: No  . Drug use: No  . Sexual activity: Yes    Comment: married, bus driver  Other Topics Concern  . Not on file  Social History Narrative  . Not on file   Social Determinants of Health   Financial Resource Strain: Low Risk   . Difficulty of Paying Living Expenses: Not hard at all  Food Insecurity: No Food Insecurity  . Worried About Programme researcher, broadcasting/film/videounning Out of Food in the Last Year: Never true  . Ran Out of Food in the Last Year: Never true  Transportation Needs: No Transportation Needs  . Lack of Transportation (Medical): No  . Lack of Transportation (Non-Medical): No  Physical Activity: Sufficiently Active  . Days of Exercise per Week: 5 days  . Minutes of Exercise per Session: 40 min  Stress: No Stress Concern Present  . Feeling of Stress : Not at all  Social Connections: Moderately Integrated  . Frequency of Communication with Friends and Family: More than three times a week  . Frequency of Social Gatherings with Friends and Family: Three times a week  . Attends Religious Services: More than 4 times per year  . Active Member of Clubs or Organizations: No  . Attends Banker Meetings:  Never  . Marital Status: Married    Tobacco Counseling Counseling given: Not Answered   Clinical Intake:  Pre-visit preparation completed: Yes  Pain : 0-10 Pain Score: 7  Pain Type: Chronic pain Pain Location:  (shoulder) Pain Orientation: Right,Left Pain Onset: More than a month ago Pain Frequency: Constant Pain Relieving Factors: Aleve  Pain Relieving Factors: Aleve  Nutritional Risks: None Diabetes: Yes CBG done?: No Did pt. bring in CBG monitor from home?: No  How often do you need to have someone help you when you read instructions, pamphlets, or other written materials from your doctor or pharmacy?: 1 - Never  Diabetic?Yes Nutrition Risk Assessment:  Has the patient had any N/V/D within the last 2 months?  Yes  Does the patient have any non-healing wounds?  No  Has the patient had any unintentional weight loss or weight gain?  No   Diabetes:  Is the patient diabetic?  Yes  If diabetic, was a CBG obtained today?  No  Did the patient bring in their glucometer from home?  No  How often do you monitor your CBG's? Patient does not check glucose at home .   Financial Strains and Diabetes Management:  Are you having any financial strains with the device, your supplies or your medication? No .  Does the patient want to be seen by Chronic Care Management for management of their diabetes?  No  Would the patient like to be referred to a Nutritionist or for Diabetic Management?  No   Diabetic Exams:  Diabetic Eye Exam: Completed 07/18/2020 Diabetic Foot Exam: Overdue, Pt has been advised about the importance in completing this exam. Pt is scheduled for diabetic foot exam on 05/19/2021.   Interpreter Needed?: No  Information entered by :: SCrews,LPN   Activities of Daily Living In your present state of health, do you have any difficulty performing the following activities: 05/01/2021  Hearing? Y  Vision? N  Difficulty concentrating or making decisions? N   Walking or climbing stairs? N  Dressing or bathing? N  Doing errands, shopping? N  Preparing Food and eating ? N  Using the Toilet? N  In the past six months, have you accidently leaked urine? N  Do you have problems with loss of bowel control? N  Managing your Medications? N  Managing your Finances? N  Housekeeping or managing your Housekeeping? N  Some recent data might be hidden    Patient Care Team: Donita Brooks, MD as PCP - General (Family Medicine)  Indicate any recent Medical Services you may have received from other than [facility] providers in the past year (date may be approximate).     Assessment:   This is a routine wellness examination for Jonluke.  Hearing/Vision screen  Hearing Screening   125Hz  250Hz  500Hz  1000Hz   2000Hz  3000Hz  4000Hz  6000Hz  8000Hz   Right ear:           Left ear:           Vision Screening Comments: Patient states gets eyes examined once per year. Currently wearing glasses    Dietary issues and exercise activities discussed: Current Exercise Habits: Home exercise routine, Type of exercise: walking, Time (Minutes): 35, Frequency (Times/Week): 5, Weekly Exercise (Minutes/Week): 175, Intensity: Mild, Exercise limited by: None identified  Goals Addressed            This Visit's Progress   . HEMOGLOBIN A1C < 7      . Prevent falls        Depression Screen PHQ 2/9 Scores 05/01/2021 07/05/2017 01/14/2016  PHQ - 2 Score 0 0 0    Fall Risk Fall Risk  05/01/2021 07/05/2017 01/14/2016  Falls in the past year? 0 No No  Number falls in past yr: 0 - -  Injury with Fall? 0 - -  Risk for fall due to : No Fall Risks - -  Follow up Falls evaluation completed;Falls prevention discussed - -    FALL RISK PREVENTION PERTAINING TO THE HOME:  Any stairs in or around the home? No  If so, are there any without handrails? No  Home free of loose throw rugs in walkways, pet beds, electrical cords, etc? Yes  Adequate lighting in your home to reduce risk of  falls? Yes   ASSISTIVE DEVICES UTILIZED TO PREVENT FALLS:  Life alert? No  Use of a cane, walker or w/c? No  Grab bars in the bathroom? No  Shower chair or bench in shower? No  Elevated toilet seat or a handicapped toilet? No   TIMED UP AND GO:  Was the test performed? Yes .  Length of time to ambulate 10 feet: 4 sec.   Gait steady and fast without use of assistive device  Cognitive Function:        Immunizations Immunization History  Administered Date(s) Administered  . Influenza, High Dose Seasonal PF 09/05/2019  . Influenza,inj,Quad PF,6+ Mos 08/31/2013, 01/14/2016, 08/01/2019  . PFIZER(Purple Top)SARS-COV-2 Vaccination 02/15/2020, 03/07/2020, 10/08/2020  . Pneumococcal Polysaccharide-23 01/14/2016    TDAP status: Due, Education has been provided regarding the importance of this vaccine. Advised may receive this vaccine at local pharmacy or Health Dept. Aware to provide a copy of the vaccination record if obtained from local pharmacy or Health Dept. Verbalized acceptance and understanding.  Flu Vaccine status: Due, Education has been provided regarding the importance of this vaccine. Advised may receive this vaccine at local pharmacy or Health Dept. Aware to provide a copy of the vaccination record if obtained from local pharmacy or Health Dept. Verbalized acceptance and understanding.  Pneumococcal vaccine status: Declined,  Education has been provided regarding the importance of this vaccine but patient still declined. Advised may receive this vaccine at local pharmacy or Health Dept. Aware to provide a copy of the vaccination record if obtained from local pharmacy or Health Dept. Verbalized acceptance and understanding.   Covid-19 vaccine status: Completed vaccines  Qualifies for Shingles Vaccine? Yes   Zostavax completed No   Shingrix Completed?: No.    Education has been provided regarding the importance of this vaccine. Patient has been advised to call insurance  company to determine out of pocket expense if they have not yet received this vaccine. Advised may also receive vaccine at local pharmacy or Health Dept. Verbalized acceptance and understanding.  Screening Tests Health Maintenance  Topic Date Due  . Hepatitis C Screening  Never done  . TETANUS/TDAP  Never done  . Zoster Vaccines- Shingrix (1 of 2) Never done  . PNA vac Low Risk Adult (2 of 2 - PCV13) 01/13/2017  . FOOT EXAM  07/16/2020  . HEMOGLOBIN A1C  05/12/2021  . INFLUENZA VACCINE  07/07/2021  . OPHTHALMOLOGY EXAM  07/18/2021  . URINE MICROALBUMIN  11/11/2021  . COVID-19 Vaccine  Completed  . HPV VACCINES  Aged Out    Health Maintenance  Health Maintenance Due  Topic Date Due  . Hepatitis C Screening  Never done  . TETANUS/TDAP  Never done  . Zoster Vaccines- Shingrix (1 of 2) Never done  . PNA vac Low Risk Adult (2 of 2 - PCV13) 01/13/2017  . FOOT EXAM  07/16/2020    Colorectal cancer screening: No longer required.   Lung Cancer Screening: (Low Dose CT Chest recommended if Age 34-80 years, 30 pack-year currently smoking OR have quit w/in 15years.) does not qualify.   Lung Cancer Screening Referral: N/A   Additional Screening:  Hepatitis C Screening: does qualify;   Vision Screening: Recommended annual ophthalmology exams for early detection of glaucoma and other disorders of the eye. Is the patient up to date with their annual eye exam?  Yes  Who is the provider or what is the name of the office in which the patient attends annual eye exams? Dr.McFarland If pt is not established with a provider, would they like to be referred to a provider to establish care? No .   Dental Screening: Recommended annual dental exams for proper oral hygiene  Community Resource Referral / Chronic Care Management: CRR required this visit?  No   CCM required this visit?  No      Plan:     I have personally reviewed and noted the following in the patient's chart:   . Medical  and social history . Use of alcohol, tobacco or illicit drugs  . Current medications and supplements including opioid prescriptions. Patient is not currently taking opioid prescriptions. . Functional ability and status . Nutritional status . Physical activity . Advanced directives . List of other physicians . Hospitalizations, surgeries, and ER visits in previous 12 months . Vitals . Screenings to include cognitive, depression, and falls . Referrals and appointments  In addition, I have reviewed and discussed with patient certain preventive protocols, quality metrics, and best practice recommendations. A written personalized care plan for preventive services as well as general preventive health recommendations were provided to patient.     Theodora Blow, LPN   [id number]   Nurse Notes: Patient has concerns of pain to both arms and shoulders. Would like to see if provider can prescribe something other than the meloxicam

## 2021-05-01 ENCOUNTER — Other Ambulatory Visit: Payer: Self-pay

## 2021-05-01 ENCOUNTER — Telehealth: Payer: Self-pay | Admitting: Family Medicine

## 2021-05-01 ENCOUNTER — Ambulatory Visit (INDEPENDENT_AMBULATORY_CARE_PROVIDER_SITE_OTHER): Payer: Medicare HMO

## 2021-05-01 VITALS — BP 104/54 | Temp 98.1°F | Ht 68.0 in | Wt 142.1 lb

## 2021-05-01 DIAGNOSIS — Z0000 Encounter for general adult medical examination without abnormal findings: Secondary | ICD-10-CM

## 2021-05-01 NOTE — Telephone Encounter (Signed)
Patient had a medicare wellness visit today and during visit he stated that he is currently taking meloxicam for bilateral shoulder and neck pain with no relief. He would like to know if something else could be prescribed to help with the pain. Please advise?

## 2021-05-01 NOTE — Patient Instructions (Signed)
Mr. Bryan Mccullough , Thank you for taking time to come for your Medicare Wellness Visit. I appreciate your ongoing commitment to your health goals. Please review the following plan we discussed and let me know if I can assist you in the future.   Screening recommendations/referrals: Colonoscopy: No longer required  Recommended yearly ophthalmology/optometry visit for glaucoma screening and checkup Recommended yearly dental visit for hygiene and checkup  Vaccinations: Influenza vaccine: Up to date next due fall 2022  Pneumococcal vaccine: Currently due, you may receive at your next office visit  Tdap vaccine: Currently due, you may await and injury to receive  Shingles vaccine: Currently due for Shingrix, if you would like to receive we recommend that you do so at your local pharmacy     Advanced directives: Advance directive discussed with you today. Even though you declined this today please call our office should you change your mind and we can give you the proper paperwork for you to fill out.   Conditions/risks identified: None   Next appointment: 05/19/2021 @ 10:15 am with Dr. Tanya Nones  Preventive Care 65 Years and Older, Male Preventive care refers to lifestyle choices and visits with your health care provider that can promote health and wellness. What does preventive care include?  A yearly physical exam. This is also called an annual well check.  Dental exams once or twice a year.  Routine eye exams. Ask your health care provider how often you should have your eyes checked.  Personal lifestyle choices, including:  Daily care of your teeth and gums.  Regular physical activity.  Eating a healthy diet.  Avoiding tobacco and drug use.  Limiting alcohol use.  Practicing safe sex.  Taking low doses of aspirin every day.  Taking vitamin and mineral supplements as recommended by your health care provider. What happens during an annual well check? The services and screenings  done by your health care provider during your annual well check will depend on your age, overall health, lifestyle risk factors, and family history of disease. Counseling  Your health care provider may ask you questions about your:  Alcohol use.  Tobacco use.  Drug use.  Emotional well-being.  Home and relationship well-being.  Sexual activity.  Eating habits.  History of falls.  Memory and ability to understand (cognition).  Work and work Astronomer. Screening  You may have the following tests or measurements:  Height, weight, and BMI.  Blood pressure.  Lipid and cholesterol levels. These may be checked every 5 years, or more frequently if you are over 50 years old.  Skin check.  Lung cancer screening. You may have this screening every year starting at age 15 if you have a 30-pack-year history of smoking and currently smoke or have quit within the past 15 years.  Fecal occult blood test (FOBT) of the stool. You may have this test every year starting at age 2.  Flexible sigmoidoscopy or colonoscopy. You may have a sigmoidoscopy every 5 years or a colonoscopy every 10 years starting at age 54.  Prostate cancer screening. Recommendations will vary depending on your family history and other risks.  Hepatitis C blood test.  Hepatitis B blood test.  Sexually transmitted disease (STD) testing.  Diabetes screening. This is done by checking your blood sugar (glucose) after you have not eaten for a while (fasting). You may have this done every 1-3 years.  Abdominal aortic aneurysm (AAA) screening. You may need this if you are a current or former smoker.  Osteoporosis.  You may be screened starting at age 57 if you are at high risk. Talk with your health care provider about your test results, treatment options, and if necessary, the need for more tests. Vaccines  Your health care provider may recommend certain vaccines, such as:  Influenza vaccine. This is recommended  every year.  Tetanus, diphtheria, and acellular pertussis (Tdap, Td) vaccine. You may need a Td booster every 10 years.  Zoster vaccine. You may need this after age 63.  Pneumococcal 13-valent conjugate (PCV13) vaccine. One dose is recommended after age 31.  Pneumococcal polysaccharide (PPSV23) vaccine. One dose is recommended after age 81. Talk to your health care provider about which screenings and vaccines you need and how often you need them. This information is not intended to replace advice given to you by your health care provider. Make sure you discuss any questions you have with your health care provider. Document Released: 12/20/2015 Document Revised: 08/12/2016 Document Reviewed: 09/24/2015 Elsevier Interactive Patient Education  2017 [employer].  Fall Prevention in the Home Falls can cause injuries. They can happen to people of all ages. There are many things you can do to make your home safe and to help prevent falls. What can I do on the outside of my home?  Regularly fix the edges of walkways and driveways and fix any cracks.  Remove anything that might make you trip as you walk through a door, such as a raised step or threshold.  Trim any bushes or trees on the path to your home.  Use bright outdoor lighting.  Clear any walking paths of anything that might make someone trip, such as rocks or tools.  Regularly check to see if handrails are loose or broken. Make sure that both sides of any steps have handrails.  Any raised decks and porches should have guardrails on the edges.  Have any leaves, snow, or ice cleared regularly.  Use sand or salt on walking paths during winter.  Clean up any spills in your garage right away. This includes oil or grease spills. What can I do in the bathroom?  Use night lights.  Install grab bars by the toilet and in the tub and shower. Do not use towel bars as grab bars.  Use non-skid mats or decals in the tub or shower.  If  you need to sit down in the shower, use a plastic, non-slip stool.  Keep the floor dry. Clean up any water that spills on the floor as soon as it happens.  Remove soap buildup in the tub or shower regularly.  Attach bath mats securely with double-sided non-slip rug tape.  Do not have throw rugs and other things on the floor that can make you trip. What can I do in the bedroom?  Use night lights.  Make sure that you have a light by your bed that is easy to reach.  Do not use any sheets or blankets that are too big for your bed. They should not hang down onto the floor.  Have a firm chair that has side arms. You can use this for support while you get dressed.  Do not have throw rugs and other things on the floor that can make you trip. What can I do in the kitchen?  Clean up any spills right away.  Avoid walking on wet floors.  Keep items that you use a lot in easy-to-reach places.  If you need to reach something above you, use a strong step stool  that has a grab bar.  Keep electrical cords out of the way.  Do not use floor polish or wax that makes floors slippery. If you must use wax, use non-skid floor wax.  Do not have throw rugs and other things on the floor that can make you trip. What can I do with my stairs?  Do not leave any items on the stairs.  Make sure that there are handrails on both sides of the stairs and use them. Fix handrails that are broken or loose. Make sure that handrails are as long as the stairways.  Check any carpeting to make sure that it is firmly attached to the stairs. Fix any carpet that is loose or worn.  Avoid having throw rugs at the top or bottom of the stairs. If you do have throw rugs, attach them to the floor with carpet tape.  Make sure that you have a light switch at the top of the stairs and the bottom of the stairs. If you do not have them, ask someone to add them for you. What else can I do to help prevent falls?  Wear shoes  that:  Do not have high heels.  Have rubber bottoms.  Are comfortable and fit you well.  Are closed at the toe. Do not wear sandals.  If you use a stepladder:  Make sure that it is fully opened. Do not climb a closed stepladder.  Make sure that both sides of the stepladder are locked into place.  Ask someone to hold it for you, if possible.  Clearly mark and make sure that you can see:  Any grab bars or handrails.  First and last steps.  Where the edge of each step is.  Use tools that help you move around (mobility aids) if they are needed. These include:  Canes.  Walkers.  Scooters.  Crutches.  Turn on the lights when you go into a dark area. Replace any light bulbs as soon as they burn out.  Set up your furniture so you have a clear path. Avoid moving your furniture around.  If any of your floors are uneven, fix them.  If there are any pets around you, be aware of where they are.  Review your medicines with your doctor. Some medicines can make you feel dizzy. This can increase your chance of falling. Ask your doctor what other things that you can do to help prevent falls. This information is not intended to replace advice given to you by your health care provider. Make sure you discuss any questions you have with your health care provider. Document Released: 09/19/2009 Document Revised: 04/30/2016 Document Reviewed: 12/28/2014 Elsevier Interactive Patient Education  2017 [employer].

## 2021-05-02 NOTE — Telephone Encounter (Signed)
Left message return call

## 2021-05-02 NOTE — Telephone Encounter (Signed)
We have discussed this at previous office visits and tried several meds which did not help.  Next step would be ortho consult.

## 2021-05-19 ENCOUNTER — Ambulatory Visit (INDEPENDENT_AMBULATORY_CARE_PROVIDER_SITE_OTHER): Payer: Medicare HMO | Admitting: Family Medicine

## 2021-05-19 ENCOUNTER — Other Ambulatory Visit: Payer: Self-pay

## 2021-05-19 ENCOUNTER — Encounter: Payer: Self-pay | Admitting: Family Medicine

## 2021-05-19 VITALS — BP 122/60 | HR 84 | Temp 98.0°F | Resp 16 | Ht 68.0 in | Wt 139.0 lb

## 2021-05-19 DIAGNOSIS — M25511 Pain in right shoulder: Secondary | ICD-10-CM

## 2021-05-19 DIAGNOSIS — G8929 Other chronic pain: Secondary | ICD-10-CM

## 2021-05-19 DIAGNOSIS — M25512 Pain in left shoulder: Secondary | ICD-10-CM

## 2021-05-19 DIAGNOSIS — E119 Type 2 diabetes mellitus without complications: Secondary | ICD-10-CM | POA: Diagnosis not present

## 2021-05-19 DIAGNOSIS — E7800 Pure hypercholesterolemia, unspecified: Secondary | ICD-10-CM

## 2021-05-19 MED ORDER — DICLOFENAC SODIUM 75 MG PO TBEC
75.0000 mg | DELAYED_RELEASE_TABLET | Freq: Two times a day (BID) | ORAL | 2 refills | Status: DC
Start: 2021-05-19 — End: 2022-05-25

## 2021-05-19 NOTE — Progress Notes (Signed)
Subjective:    Patient ID: Bryan Mccullough, male    DOB: Aug 07, 1941, 80 y.o.   MRN: [id number]  Back Pain    12/19/19 Patient presents today for pain in his right shoulder.  He has pain in both shoulders but his right is the worst.  When I last saw him, I had suspected osteoarthritis in the shoulder and had recommended diclofenac 75 mg twice a day.  I was concerned that the patient who already has GERD would likely have stomach pain from doing this also added Protonix 40 mg a day for GI protection.  Patient has become confused and has stopped both of these medication.  He states that he believes the diclofenac is helping with his shoulder pain.  He states that with his shoulder at his side he is able to do anything throughout the day with no pain.  However he has pain with abduction greater than 90 degrees and his shoulder aches and throbs at night keeping him awake.  The symptoms sound more consistent with bursitis in the shoulder.  In April I gave him a cortisone injection in the right subacromial space and he states the pain stayed away for several months before returned.  Therefore I am uncertain if the patient has osteoarthritis or if this could be subacromial bursitis.  At that time, my plan was: Uncertain as to whether the patient has subacromial bursitis in the right shoulder or if he has osteoarthritis.  I have sent the patient today for an x-ray.  I would like the patient to continue to take diclofenac 75 mg twice daily for shoulder pain.  He can use pantoprazole for GI protection to prevent peptic ulcer disease given the chronic use of NSAIDs.  Patient requests a cortisone injection in his right shoulder.  Using sterile technique, I injected the right subacromial space with 2 cc lidocaine, 2 cc of Marcaine, and 2 cc of 40 mg/mL Kenalog.  If the x-ray does not show any arthritis and the cortisone injection does not help I would recommend orthopedics consultation for possible rotator cuff  tendinitis.  05/02/20 History is difficult to obtain. I am uncertain whether the patient has been taking diclofenac 75 mg twice a day. He seems very confused about this medication and it does not appear that he has been taking it. However the medicine seem like it helped. Patient is uncertain. After asking him several times he states that the medicine did help but I question if he is even been taking it based on his confusion about the name of the medication. He again complains of pain in both shoulders. However now they are equal in pain. He reports pain with passive range of motion. There is palpable crepitus in both shoulders. He has pain with abduction greater than 90 degrees bilaterally. However he is also complaining of pain in his neck. He also occasionally reports numbness in his hands at night sleeping. He reports occasional pains radiating down his arms into his hands. He has negative Tinel and negative Phalen sign today. He has pain with Hawkins maneuver. He has pain with can testing bilaterally. He was diagnosed with diabetes last year. He is overdue for fasting lab work pending to discuss well.  AT that time, my plan was:  At this point is difficult to determine the cause of the patient's pain. His exam would suggest possible osteoarthritis in both shoulders versus subacromial bursitis although I cannot rule out cervical radiculopathy. Therefore I have again asked the patient  to get an x-ray. He has yet to get the x-ray of his shoulder. I will order an x-ray of both his right and left shoulder along with cervical spine. He has not been taking diclofenac from what I gather. Therefore in an effort to simplify his medications, I will start him on Mobic 15 mg a day for left suspect is osteoarthritis in the shoulders. Of asked him to take this consistently to see if the pain in the shoulder proved. If the pain does not improve, we will need to be concerned about GI toxicity depending upon how long we  will continue the medication but at the present time I simply want to see if the medicine will help. Of asked that the patient come in fasting tomorrow for a CBC, CMP, lipid panel, A1c, and urine microalbumin pertaining to his diabetes.  05/19/21 Patient is here mainly today as a checkup for his diabetes.  However he is requesting a cortisone injection in both shoulders.  He saw benefit from the previous cortisone injection.  I reviewed the x-rays with him that we obtained in May 2021.  There was no significant arthritis other than some mild AC joint arthritis.  Therefore I believe that a lot of this is likely tendinopathy in the rotator cuff.  I explained to him that cortisone will not cure this.  We have not obtained an MRI.  He has not tried physical therapy.  Therefore I gave him the options of trying a cortisone injection in his shoulders, physical therapy, or an MRI to determine if he has a partial tear in his rotator cuff.  After weighing the risk and benefits, he would like to try to go back on diclofenac 75 mg twice daily.  In retrospect he felt like that medication helped.  He changed his mind about the cortisone injection in his shoulders today.  However he is overdue for fasting lab work to monitor his A1c and his fasting lipid panel.  His blood pressure today is well controlled.  He denies any chest pain shortness of breath or dyspnea on exertion  Past Medical History:  Diagnosis Date   BPH with elevated PSA    Diabetes mellitus type 2 in nonobese [facility](HCC)    Diverticulosis    Erectile dysfunction    Esophageal stricture    GERD (gastroesophageal reflux disease)    Hiatal hernia    HLD (hyperlipidemia)    Past Surgical History:  Procedure Laterality Date   PROSTATE SURGERY     Current Outpatient Medications on File Prior to Visit  Medication Sig Dispense Refill   metFORMIN (GLUCOPHAGE) 500 MG tablet TAKE 1 TABLET BY MOUTH TWICE DAILY WITH A MEAL 180 tablet 2   pravastatin (PRAVACHOL)  20 MG tablet Take 1 tablet (20 mg total) by mouth daily. 90 tablet 3   senna-docusate (SENOKOT-S) 8.6-50 MG tablet Take 2 tablets by mouth daily. 60 tablet 3   No current facility-administered medications on file prior to visit.    Allergies  Allergen Reactions   Penicillins    Social History   Socioeconomic History   Marital status: Single    Spouse name: Not on file   Number of children: Not on file   Years of education: Not on file   Highest education level: Not on file  Occupational History   Not on file  Tobacco Use   Smoking status: Never   Smokeless tobacco: Never  Substance and Sexual Activity   Alcohol use: No  Drug use: No   Sexual activity: Yes    Comment: married, bus driver  Other Topics Concern   Not on file  Social History Narrative   Not on file   Social Determinants of Health   Financial Resource Strain: Low Risk    Difficulty of Paying Living Expenses: Not hard at all  Food Insecurity: No Food Insecurity   Worried About Programme researcher, broadcasting/film/video in the Last Year: Never true   Barista in the Last Year: Never true  Transportation Needs: No Transportation Needs   Lack of Transportation (Medical): No   Lack of Transportation (Non-Medical): No  Physical Activity: Sufficiently Active   Days of Exercise per Week: 5 days   Minutes of Exercise per Session: 40 min  Stress: No Stress Concern Present   Feeling of Stress : Not at all  Social Connections: Moderately Integrated   Frequency of Communication with Friends and Family: More than three times a week   Frequency of Social Gatherings with Friends and Family: Three times a week   Attends Religious Services: More than 4 times per year   Active Member of Clubs or Organizations: No   Attends Banker Meetings: Never   Marital Status: Married  Catering manager Violence: Not At Risk   Fear of Current or Ex-Partner: No   Emotionally Abused: No   Physically Abused: No   Sexually Abused: No    Family History  Problem Relation Age of Onset   Heart disease Brother    Stroke Brother      Review of Systems  Musculoskeletal:  Positive for back pain.  All other systems reviewed and are negative.     Objective:   Physical Exam Vitals reviewed.  Constitutional:      General: He is not in acute distress.    Appearance: He is well-developed. He is not diaphoretic.  HENT:     Head: Normocephalic and atraumatic.  Neck:     Thyroid: No thyromegaly.  Cardiovascular:     Rate and Rhythm: Normal rate and regular rhythm.     Heart sounds: Normal heart sounds. No murmur heard.   No friction rub. No gallop.  Pulmonary:     Effort: Pulmonary effort is normal. No respiratory distress.     Breath sounds: Normal breath sounds. No wheezing or rales.  Chest:     Chest wall: No tenderness.  Abdominal:     General: Bowel sounds are normal.     Palpations: Abdomen is soft.  Musculoskeletal:     Right shoulder: Crepitus present. No effusion. Decreased range of motion. Normal strength.     Left shoulder: Crepitus present. No tenderness. Decreased range of motion. Normal strength.     Cervical back: Normal range of motion and neck supple. Tenderness present. No bony tenderness.       Back:     Right knee: No swelling or effusion. Normal range of motion. No medial joint line or lateral joint line tenderness.  Skin:    General: Skin is warm.     Coloration: Skin is not pale.     Findings: No erythema or rash.  Neurological:     Motor: No abnormal muscle tone.     Deep Tendon Reflexes: Reflexes are normal and symmetric.         Assessment & Plan:    Diabetes mellitus type 2 in nonobese (HCC) - Plan: Hemoglobin A1c, COMPLETE METABOLIC PANEL WITH GFR, Lipid panel, Microalbumin, urine  Chronic left shoulder pain  Chronic right shoulder pain  Pure hypercholesterolemia Blood pressure today is well controlled.  Check CBC, CMP, lipid panel, A1c, and urine microalbumin.  I like to  see his LDL cholesterol below 100 and his A1c below 6.5 and his albumin to creatinine ratio less than 30.  We discussed options for his chronic shoulder pain which I believe is due to tendinopathy in his rotator cuff.  I offered the patient an MRI versus physical therapy versus a cortisone injection.  After discussing the risk and benefits of all these options, the patient elected to try diclofenac 75 mg twice a day.

## 2021-05-20 LAB — COMPLETE METABOLIC PANEL WITH GFR
AG Ratio: 1.7 (calc) (ref 1.0–2.5)
ALT: 11 U/L (ref 9–46)
AST: 17 U/L (ref 10–35)
Albumin: 4.5 g/dL (ref 3.6–5.1)
Alkaline phosphatase (APISO): 78 U/L (ref 35–144)
BUN: 19 mg/dL (ref 7–25)
CO2: 28 mmol/L (ref 20–32)
Calcium: 9.7 mg/dL (ref 8.6–10.3)
Chloride: 104 mmol/L (ref 98–110)
Creat: 0.89 mg/dL (ref 0.70–1.18)
GFR, Est African American: 95 mL/min/{1.73_m2} (ref >=60)
GFR, Est Non African American: 82 mL/min/{1.73_m2} (ref >=60)
Globulin: 2.7 g/dL (calc) (ref 1.9–3.7)
Glucose, Bld: 109 mg/dL — ABNORMAL HIGH (ref 65–99)
Potassium: 4.6 mmol/L (ref 3.5–5.3)
Sodium: 139 mmol/L (ref 135–146)
Total Bilirubin: 0.8 mg/dL (ref 0.2–1.2)
Total Protein: 7.2 g/dL (ref 6.1–8.1)

## 2021-05-20 LAB — MICROALBUMIN, URINE: Microalb, Ur: 4.6 mg/dL

## 2021-05-20 LAB — LIPID PANEL
Cholesterol: 147 mg/dL (ref <200)
HDL: 37 mg/dL — ABNORMAL LOW (ref >=40)
LDL Cholesterol (Calc): 80 mg/dL (calc)
Non-HDL Cholesterol (Calc): 110 mg/dL (calc) (ref <130)
Total CHOL/HDL Ratio: 4 (calc) (ref <5.0)
Triglycerides: 205 mg/dL — ABNORMAL HIGH (ref <150)

## 2021-05-20 LAB — HEMOGLOBIN A1C
Hgb A1c MFr Bld: 6.2 % of total Hgb — ABNORMAL HIGH (ref <5.7)
Mean Plasma Glucose: 131 mg/dL
eAG (mmol/L): 7.3 mmol/L

## 2021-08-26 DIAGNOSIS — R972 Elevated prostate specific antigen [PSA]: Secondary | ICD-10-CM | POA: Diagnosis not present

## 2021-09-01 DIAGNOSIS — N401 Enlarged prostate with lower urinary tract symptoms: Secondary | ICD-10-CM | POA: Diagnosis not present

## 2021-09-01 DIAGNOSIS — R350 Frequency of micturition: Secondary | ICD-10-CM | POA: Diagnosis not present

## 2021-09-01 DIAGNOSIS — R972 Elevated prostate specific antigen [PSA]: Secondary | ICD-10-CM | POA: Diagnosis not present

## 2021-09-02 ENCOUNTER — Other Ambulatory Visit: Payer: Self-pay | Admitting: Urology

## 2021-09-02 DIAGNOSIS — R972 Elevated prostate specific antigen [PSA]: Secondary | ICD-10-CM

## 2021-09-22 ENCOUNTER — Other Ambulatory Visit: Payer: Self-pay

## 2021-09-22 ENCOUNTER — Ambulatory Visit
Admission: RE | Admit: 2021-09-22 | Discharge: 2021-09-22 | Disposition: A | Discharge status: Home / Self Care | Payer: Medicare HMO | Source: Ambulatory Visit | Attending: Urology | Admitting: Urology

## 2021-09-22 DIAGNOSIS — R972 Elevated prostate specific antigen [PSA]: Secondary | ICD-10-CM

## 2021-09-22 DIAGNOSIS — K5730 Diverticulosis of large intestine without perforation or abscess without bleeding: Secondary | ICD-10-CM | POA: Diagnosis not present

## 2021-09-22 MED ORDER — GADOBENATE DIMEGLUMINE 529 MG/ML IV SOLN
13.0000 mL | Freq: Once | INTRAVENOUS | Status: AC | PRN
  Administered 2021-09-22: 13 mL via INTRAVENOUS

## 2021-09-22 MED ORDER — PRAVASTATIN SODIUM 20 MG PO TABS: 20.0000 mg | ORAL_TABLET | Freq: Every day | ORAL | 3 refills | Status: DC

## 2021-11-12 DIAGNOSIS — R972 Elevated prostate specific antigen [PSA]: Secondary | ICD-10-CM | POA: Diagnosis not present

## 2021-11-19 DIAGNOSIS — R972 Elevated prostate specific antigen [PSA]: Secondary | ICD-10-CM | POA: Diagnosis not present

## 2021-11-19 DIAGNOSIS — N401 Enlarged prostate with lower urinary tract symptoms: Secondary | ICD-10-CM | POA: Diagnosis not present

## 2021-11-19 DIAGNOSIS — R350 Frequency of micturition: Secondary | ICD-10-CM | POA: Diagnosis not present

## 2022-03-10 ENCOUNTER — Ambulatory Visit (INDEPENDENT_AMBULATORY_CARE_PROVIDER_SITE_OTHER): Payer: Medicare HMO | Admitting: Family Medicine

## 2022-03-10 VITALS — BP 116/50 | HR 84 | Temp 97.1°F | Ht 68.0 in | Wt 141.2 lb

## 2022-03-10 DIAGNOSIS — J0110 Acute frontal sinusitis, unspecified: Secondary | ICD-10-CM

## 2022-03-10 DIAGNOSIS — K219 Gastro-esophageal reflux disease without esophagitis: Secondary | ICD-10-CM | POA: Diagnosis not present

## 2022-03-10 MED ORDER — CEFDINIR 300 MG PO CAPS: 300.0000 mg | ORAL_CAPSULE | Freq: Two times a day (BID) | ORAL | 0 refills | Status: DC

## 2022-03-10 MED ORDER — FLUTICASONE PROPIONATE 50 MCG/ACT NA SUSP: 2.0000 | Freq: Every day | NASAL | 6 refills | Status: DC

## 2022-03-10 MED ORDER — PANTOPRAZOLE SODIUM 40 MG PO TBEC: 40.0000 mg | DELAYED_RELEASE_TABLET | Freq: Every day | ORAL | 3 refills | Status: DC

## 2022-03-10 NOTE — Progress Notes (Signed)
? ?Subjective:  ? ? Patient ID: Bryan Mccullough, male    DOB: 05-27-1941, 81 y.o.   MRN: [id number] ? ?Patient reports a 2-week history of pain in his forehead.  It is a pressure-like pain.  It is worse if he turns his head side to side.  Has been dealing with allergies and been taking nasal spray and allergy medication without relief.  He reports congestion and rhinorrhea and occasional epistaxis.  The pain is mild.  It is a pressure-like pain.  There is no photophobia or phonophobia or blurry vision.  He denies any nausea or vomiting.  He denies any neck pain or fever.  He also reports reflux whenever he bends over.  He denies any chest pain or shortness of breath or dyspnea on exertion. ?Past Medical History:  ?Diagnosis Date  ?? BPH with elevated PSA   ?? Diabetes mellitus type 2 in nonobese [facility])   ?? Diverticulosis   ?? Erectile dysfunction   ?? Esophageal stricture   ?? GERD (gastroesophageal reflux disease)   ?? Hiatal hernia   ?? HLD (hyperlipidemia)   ? ?Past Surgical History:  ?Procedure Laterality Date  ?? PROSTATE SURGERY    ? ?Current Outpatient Medications on File Prior to Visit  ?Medication Sig Dispense Refill  ?? diclofenac (VOLTAREN) 75 MG EC tablet Take 1 tablet (75 mg total) by mouth 2 (two) times daily. 60 tablet 2  ?? metFORMIN (GLUCOPHAGE) 500 MG tablet TAKE 1 TABLET BY MOUTH TWICE DAILY WITH A MEAL 180 tablet 2  ?? pravastatin (PRAVACHOL) 20 MG tablet Take 1 tablet (20 mg total) by mouth daily. 90 tablet 3  ?? senna-docusate (SENOKOT-S) 8.6-50 MG tablet Take 2 tablets by mouth daily. 60 tablet 3  ? ?No current facility-administered medications on file prior to visit.  ? ? ?Allergies  ?Allergen Reactions  ?? Penicillins   ? ?Social History  ? ?Socioeconomic History  ?? Marital status: Single  ?  Spouse name: Not on file  ?? Number of children: Not on file  ?? Years of education: Not on file  ?? Highest education level: Not on file  ?Occupational History  ?? Not on file  ?Tobacco Use  ??  Smoking status: Never  ?? Smokeless tobacco: Never  ?Substance and Sexual Activity  ?? Alcohol use: No  ?? Drug use: No  ?? Sexual activity: Yes  ?  Comment: married, bus driver  ?Other Topics Concern  ?? Not on file  ?Social History Narrative  ?? Not on file  ? ?Social Determinants of Health  ? ?Financial Resource Strain: Low Risk   ?? Difficulty of Paying Living Expenses: Not hard at all  ?Food Insecurity: No Food Insecurity  ?? Worried About Programme researcher, broadcasting/film/video in the Last Year: Never true  ?? Ran Out of Food in the Last Year: Never true  ?Transportation Needs: No Transportation Needs  ?? Lack of Transportation (Medical): No  ?? Lack of Transportation (Non-Medical): No  ?Physical Activity: Sufficiently Active  ?? Days of Exercise per Week: 5 days  ?? Minutes of Exercise per Session: 40 min  ?Stress: No Stress Concern Present  ?? Feeling of Stress : Not at all  ?Social Connections: Moderately Integrated  ?? Frequency of Communication with Friends and Family: More than three times a week  ?? Frequency of Social Gatherings with Friends and Family: Three times a week  ?? Attends Religious Services: More than 4 times per year  ?? Active Member of Clubs or Organizations: No  ??  Attends Banker Meetings: Never  ?? Marital Status: Married  ?Intimate Partner Violence: Not At Risk  ?? Fear of Current or Ex-Partner: No  ?? Emotionally Abused: No  ?? Physically Abused: No  ?? Sexually Abused: No  ? ?Family History  ?Problem Relation Age of Onset  ?? Heart disease Brother   ?? Stroke Brother   ? ? ? ?Review of Systems  ?Musculoskeletal:  Positive for back pain.  ?All other systems reviewed and are negative. ? ?   ?Objective:  ? Physical Exam ?Vitals reviewed.  ?Constitutional:   ?   General: He is not in acute distress. ?   Appearance: He is well-developed. He is not diaphoretic.  ?HENT:  ?   Head: Normocephalic and atraumatic.  ?   Nose:  ?   Right Nostril: No epistaxis.  ?   Left Nostril: No epistaxis.  ?    Right Turbinates: Enlarged.  ?   Left Turbinates: Enlarged.  ?   Right Sinus: Frontal sinus tenderness present.  ?Neck:  ?   Thyroid: No thyromegaly.  ?Cardiovascular:  ?   Rate and Rhythm: Normal rate and regular rhythm.  ?   Heart sounds: Normal heart sounds. No murmur heard. ?  No friction rub. No gallop.  ?Pulmonary:  ?   Effort: Pulmonary effort is normal. No respiratory distress.  ?   Breath sounds: Normal breath sounds. No wheezing or rales.  ?Chest:  ?   Chest wall: No tenderness.  ?Abdominal:  ?   General: Bowel sounds are normal.  ?   Palpations: Abdomen is soft.  ?Musculoskeletal:  ?   Cervical back: Normal range of motion and neck supple.  ?   Right knee: No medial joint line or lateral joint line tenderness.  ?Skin: ?   General: Skin is warm.  ?   Coloration: Skin is not pale.  ?   Findings: No erythema or rash.  ?Neurological:  ?   Motor: No abnormal muscle tone.  ?   Deep Tendon Reflexes: Reflexes are normal and symmetric.  ? ? ? ? ?   ?Assessment & Plan:  ? ?Acute frontal sinusitis, recurrence not specified ? ?Gastroesophageal reflux disease without esophagitis ?I will try the patient on Omnicef 300 mg p.o. twice daily for 10 days for sinusitis.  Add Flonase 2 sprays each nostril daily.  Treat GERD with Protonix 40 mg daily. ?

## 2022-03-19 ENCOUNTER — Telehealth: Payer: Self-pay | Admitting: Family Medicine

## 2022-03-19 IMAGING — MR MR PROSTATE WO/W CM
12 series · 48 of 48 positions shown · IV contrast (multihance)
Comparison: 11/28/2019

CLINICAL DATA: Elevated PSA level of 7.8. Previous biopsy dated
06/11/2016 was benign.

EXAM:
MR PROSTATE WITHOUT AND WITH CONTRAST
TECHNIQUE: Multiplanar multisequence MRI images were obtained of the pelvis
centered about the prostate. Pre and post contrast images were
obtained.
CONTRAST:  13mL MULTIHANCE GADOBENATE DIMEGLUMINE 529 MG/ML IV SOLN

[Series 3: T2 · coronal · 3.0mm · 0.56mm/px · 1 of 25 slices shown (1 of 3)]
[im 1/25]
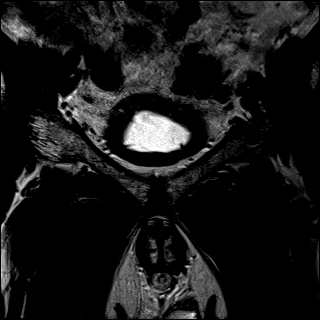

[Series 4: T1 · axial · 5.0mm · 1.25mm/px · 1 of 88 slices shown]
[im 1/88]
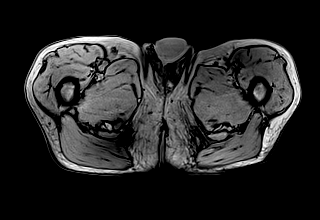

[Series 5: DWI · axial · 3.0mm · 1.75mm/px · z∈[-85,-10]mm · 2 of 78 slices shown (1 of 3)]
[im 1/78]
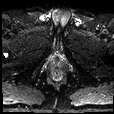
[im 78/78]
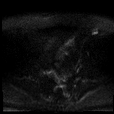

[Series 6: DWI · axial · 3.0mm · 1.75mm/px · 1 of 26 slices shown (2 of 3)]
[im 1/26]
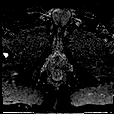

[Series 7: DWI · axial · 3.0mm · 1.75mm/px · 1 of 26 slices shown (3 of 3)]
[im 1/26]
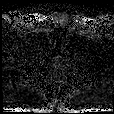

[Series 8: T2 · axial · 3.0mm · 0.56mm/px · 1 of 26 slices shown (2 of 3)]
[im 1/26]
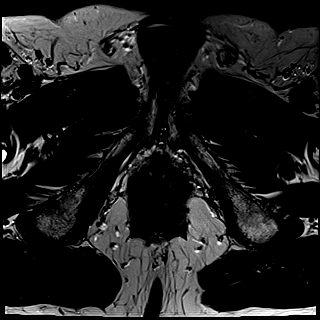

[Series 9: T2 · axial · 1.0mm · 1.04mm/px · z∈[-83,-12]mm · 2 of 72 slices shown (3 of 3)]
[im 1/72]
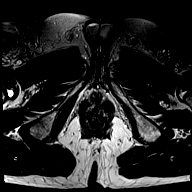
[im 72/72]
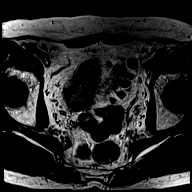

[Series 10: pre t1_twist_tra_dyn · axial · non-contrast · 3.5mm · 0.83mm/px · 1 of 22 slices shown]
[im 1/22]
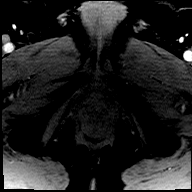

[Series 11: post t1_twist_tra_dyn-copy center · axial · non-contrast · 3.5mm · 0.83mm/px · z∈[-84,-11]mm · 17 of 660 slices shown]
[im 1/660]
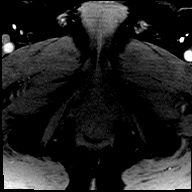
[im 42/660]
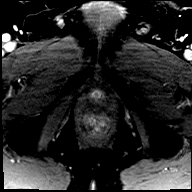
[im 83/660]
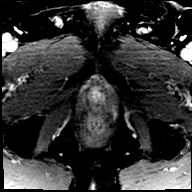
[im 124/660]
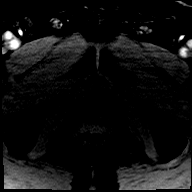
[im 165/660]
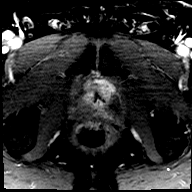
[im 206/660]
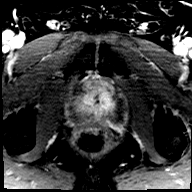
[im 248/660]
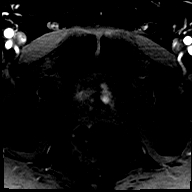
[im 289/660]
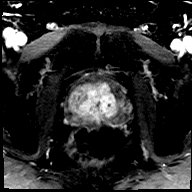
[im 330/660]
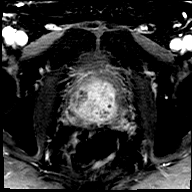
[im 371/660]
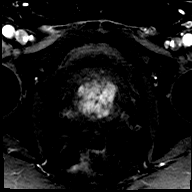
[im 412/660]
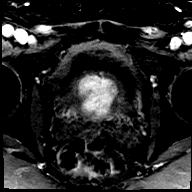
[im 454/660]
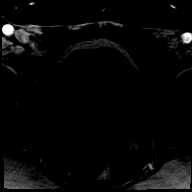
[im 495/660]
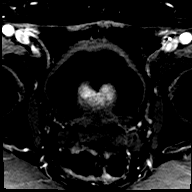
[im 536/660]
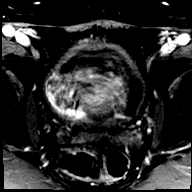
[im 577/660]
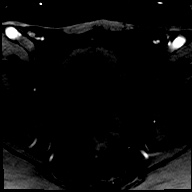
[im 618/660]
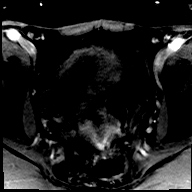
[im 660/660]
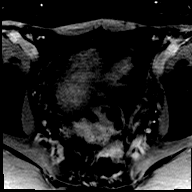

[Series 12: post t1_twist_tra_dyn-copy cent_sub · axial · 3.5mm · 0.83mm/px · z∈[-84,-11]mm · 17 of 636 slices shown]
[im 1/636]
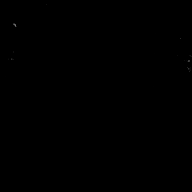
[im 40/636]
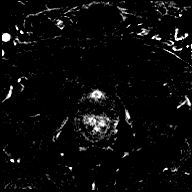
[im 80/636]
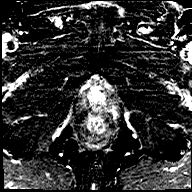
[im 120/636]
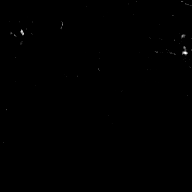
[im 159/636]
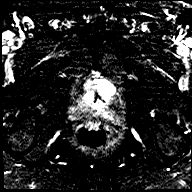
[im 199/636]
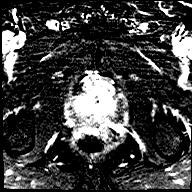
[im 239/636]
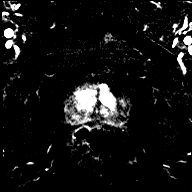
[im 278/636]
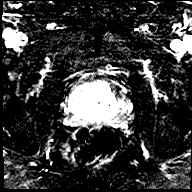
[im 318/636]
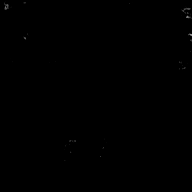
[im 358/636]
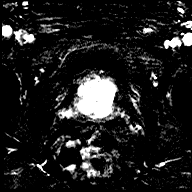
[im 397/636]
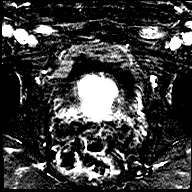
[im 437/636]
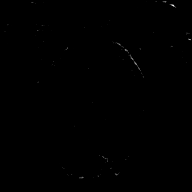
[im 477/636]
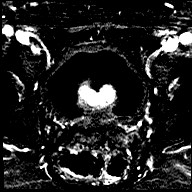
[im 516/636]
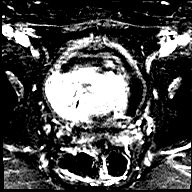
[im 556/636]
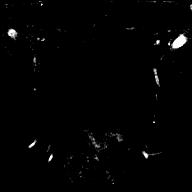
[im 596/636]
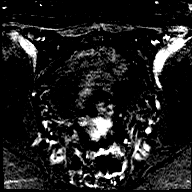
[im 636/636]
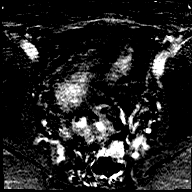

[Series 13: t1_vibe_dixon_tra_f · axial · 2.5mm · 0.91mm/px · z∈[-111,+87]mm · 2 of 80 slices shown]
[im 1/80]
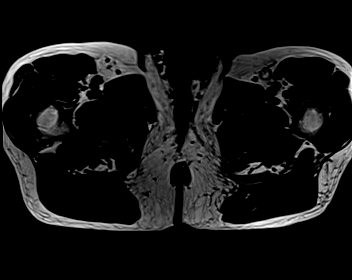
[im 80/80]
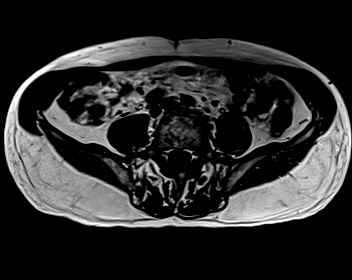

[Series 14: t1_vibe_dixon_tra_w · axial · 2.5mm · 0.91mm/px · z∈[-111,+87]mm · 2 of 80 slices shown]
[im 1/80]
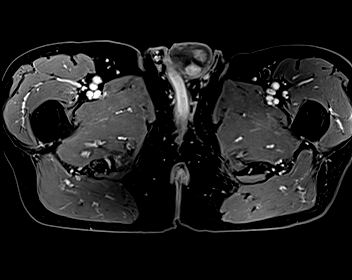
[im 80/80]
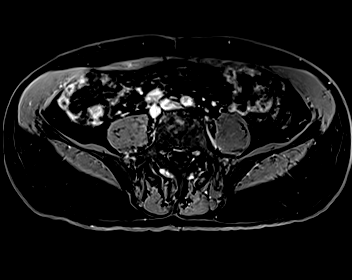

[48 of 48 positions shown; findings below may reference images not displayed]

FINDINGS: Prostate:

Region of interest # 1: PI-RADS category 4 lesion of the left
anterior and left posterolateral peripheral zone in the mid gland,
with reduced T2 signal and faint but focal early enhancement. This
measures 0.56 cc (1.6 by 0.7 by 0.8 cm) and is shown for example
image 45 series 9.

Region of interest # 2: PI-RADS category 4 lesion of the left
anterior fibromuscular stroma the apex, with some focal restriction
of diffusion and with mildly indistinct homogeneously low T2 signal
in this vicinity. This measures 0.25 cc (1.3 by 0.6 by 0.4 cm) and
is shown for example on image 21 series 8.

Encapsulated nodularity in the transition zone compatible with
benign prostatic hypertrophy. The median lobe moderately bulges into
the prostate base.

Volume: 3D volumetric analysis: Prostate volume 55.67 cc (5.8 by
by 5.5 cm).

Transcapsular spread:  Absent

Seminal vesicle involvement: Absent

Neurovascular bundle involvement: Absent

Pelvic adenopathy: Absent

Bone metastasis: Absent

Other findings: Sigmoid colon diverticulosis.
IMPRESSION: 1. PI-RADS category 4 lesion of the left peripheral zone in the mid
gland, with a PI-RADS category 4 lesion of the left anterior
fibromuscular stroma at the apex. Targeting data sent to [facility].
2. Benign prostatic hypertrophy and mild prostatomegaly.
3. Sigmoid colon diverticulosis.

## 2022-03-19 MED ORDER — METFORMIN HCL 500 MG PO TABS: ORAL_TABLET | ORAL | 2 refills | Status: DC

## 2022-03-19 NOTE — Telephone Encounter (Signed)
Received eFax from pharmacy to request refill of ? ?metFORMIN (GLUCOPHAGE) 500 MG tablet [[id number]]  ? ?Efax received from ? ?[facility] [id number] [place], [place] - [address] [facility] [address]  ?[address], [place] [address]  ?Phone:  [phone]  Fax:  [phone]  ? ?Please advise pharmacist.  ?

## 2022-03-23 DIAGNOSIS — R972 Elevated prostate specific antigen [PSA]: Secondary | ICD-10-CM | POA: Diagnosis not present

## 2022-04-02 DIAGNOSIS — R350 Frequency of micturition: Secondary | ICD-10-CM | POA: Diagnosis not present

## 2022-04-02 DIAGNOSIS — R972 Elevated prostate specific antigen [PSA]: Secondary | ICD-10-CM | POA: Diagnosis not present

## 2022-04-02 DIAGNOSIS — N401 Enlarged prostate with lower urinary tract symptoms: Secondary | ICD-10-CM | POA: Diagnosis not present

## 2022-05-23 ENCOUNTER — Other Ambulatory Visit: Payer: Self-pay | Admitting: Family Medicine

## 2022-05-25 NOTE — Telephone Encounter (Signed)
Requested medication (s) are due for refill today: yes  Requested medication (s) are on the active medication list: yes  Last refill:  05/19/21 #60 with 2 RF  Future visit scheduled: no, seen 03/10/22  Notes to clinic: Failed protocol due to some labs are from 2021, please assess.      Requested Prescriptions  Pending Prescriptions Disp Refills   diclofenac (VOLTAREN) 75 MG EC tablet [Pharmacy Med Name: DICLOFENAC SOD EC 75 MG TAB] 60 tablet 2    Sig: TAKE ONE TABLET BY MOUTH TWICE A DAY     Analgesics:  NSAIDS Failed - 05/25/2022 11:50 AM      Failed - Manual Review: Labs are only required if the patient has taken medication for more than 8 weeks.      Failed - Cr in normal range and within 360 days    Creat  Date Value Ref Range Status  05/19/2021 0.89 0.70 - 1.18 mg/dL Final    Comment:    For patients >74 years of age, the reference limit for Creatinine is approximately 13% higher for people identified as African-American. .    Creatinine, Urine  Date Value Ref Range Status  05/03/2020 201 20 - 320 mg/dL Final         Failed - HGB in normal range and within 360 days    Hemoglobin  Date Value Ref Range Status  11/11/2020 16.1 13.2 - 17.1 g/dL Final         Failed - PLT in normal range and within 360 days    Platelets  Date Value Ref Range Status  11/11/2020 214 140 - 400 Thousand/uL Final         Failed - HCT in normal range and within 360 days    HCT  Date Value Ref Range Status  11/11/2020 47.2 38.5 - 50.0 % Final         Failed - eGFR is 30 or above and within 360 days    GFR, Est African American  Date Value Ref Range Status  05/19/2021 95 > OR = 60 mL/min/1.14m Final   GFR, Est Non African American  Date Value Ref Range Status  05/19/2021 82 > OR = 60 mL/min/1.757mFinal         Passed - Patient is not pregnant      Passed - Valid encounter within last 12 months    Recent Outpatient Visits           2 months ago Acute frontal sinusitis,  recurrence not specified   [facility]   [facility] year [facility]   1 year [facility]ob[facility]  [facility] [place]   1 year ago BPH with elevated PSA   [facility]   2 years ago Chronic right shoulder pain   [facility], [place] McgeeMD

## 2022-06-30 ENCOUNTER — Ambulatory Visit: Payer: Medicare HMO | Admitting: Family Medicine

## 2022-07-08 ENCOUNTER — Other Ambulatory Visit: Payer: Self-pay

## 2022-07-08 NOTE — Telephone Encounter (Signed)
Pharmacy faxed a refill request for pantoprazole (PROTONIX) 40 MG tablet [[id number]]    Order Details Dose: 40 mg Route: Oral Frequency: Daily  Dispense Quantity: 30 tablet Refills: 3        Sig: Take 1 tablet (40 mg total) by mouth daily.       Start Date: 03/10/22 End Date: --  Written Date: 03/10/22 Expiration Date: 03/10/23

## 2022-07-09 MED ORDER — PANTOPRAZOLE SODIUM 40 MG PO TBEC: 40.0000 mg | DELAYED_RELEASE_TABLET | Freq: Every day | ORAL | 1 refills | Status: DC

## 2022-07-09 NOTE — Telephone Encounter (Signed)
Requested Prescriptions  Pending Prescriptions Disp Refills  . pantoprazole (PROTONIX) 40 MG tablet 90 tablet 1    Sig: Take 1 tablet (40 mg total) by mouth daily.     Gastroenterology: Proton Pump Inhibitors Passed - 07/08/2022  4:46 PM      Passed - Valid encounter within last 12 months    Recent Outpatient Visits          4 months ago Acute frontal sinusitis, recurrence not specified   [facility] Donita Brooks, MD   1 year ago Diabetes mellitus type 2 in nonobese [facility])   [facility] Donita Brooks, MD   1 year ago Diabetes mellitus type 2 in nonobese [facility])   [facility] Donita Brooks, MD   1 year ago BPH with elevated PSA    [facility] Donita Brooks, MD   2 years ago Chronic right shoulder pain   [facility] Pickard, Priscille Heidelberg, MD      Future Appointments            Tomorrow Pickard, Priscille Heidelberg, MD [facility]

## 2022-07-10 ENCOUNTER — Ambulatory Visit (INDEPENDENT_AMBULATORY_CARE_PROVIDER_SITE_OTHER): Payer: Medicare HMO | Admitting: Family Medicine

## 2022-07-10 VITALS — BP 120/58 | HR 69 | Temp 98.3°F | Ht 68.0 in | Wt 142.4 lb

## 2022-07-10 DIAGNOSIS — M25511 Pain in right shoulder: Secondary | ICD-10-CM

## 2022-07-10 DIAGNOSIS — G8929 Other chronic pain: Secondary | ICD-10-CM

## 2022-07-10 DIAGNOSIS — M25561 Pain in right knee: Secondary | ICD-10-CM

## 2022-07-10 MED ORDER — DICLOFENAC SODIUM 75 MG PO TBEC: 75.0000 mg | DELAYED_RELEASE_TABLET | Freq: Two times a day (BID) | ORAL | 2 refills | Status: DC

## 2022-07-10 NOTE — Progress Notes (Signed)
Subjective:    Patient ID: Bryan Mccullough, male    DOB: 1941-02-08, 81 y.o.   MRN: [id number] 2021, I treated the patient for shoulder pain.  X-rays were unremarkable.  A cortisone injection in the shoulder did help his pain and I suspected that he had bursitis in his shoulder.  He presents today with 3 weeks of pain in his right shoulder that keeps him awake at night.  He does have crepitus in the right shoulder with passive range of motion.  There is no erythema.  There is no warmth.  There is no effusion.  He has a positive empty can sign.  He also reports 2 months of pain in his right knee over the medial and lateral joint line whenever he kneels.  It also hurts when he stands from a squatted position.  He denies any laxity.  He denies any instability.  He denies any locking or catching Past Medical History:  Diagnosis Date   BPH with elevated PSA    Diabetes mellitus type 2 in nonobese [facility])    Diverticulosis    Erectile dysfunction    Esophageal stricture    GERD (gastroesophageal reflux disease)    Hiatal hernia    HLD (hyperlipidemia)    Past Surgical History:  Procedure Laterality Date   PROSTATE SURGERY     Current Outpatient Medications on File Prior to Visit  Medication Sig Dispense Refill   fluticasone (FLONASE) 50 MCG/ACT nasal spray Place 2 sprays into both nostrils daily. 16 g 6   metFORMIN (GLUCOPHAGE) 500 MG tablet TAKE 1 TABLET BY MOUTH TWICE DAILY WITH A MEAL 180 tablet 2   pantoprazole (PROTONIX) 40 MG tablet Take 1 tablet (40 mg total) by mouth daily. 90 tablet 1   pravastatin (PRAVACHOL) 20 MG tablet Take 1 tablet (20 mg total) by mouth daily. 90 tablet 3   senna-docusate (SENOKOT-S) 8.6-50 MG tablet Take 2 tablets by mouth daily. 60 tablet 3   cefdinir (OMNICEF) 300 MG capsule Take 1 capsule (300 mg total) by mouth 2 (two) times daily. (Patient not taking: Reported on 07/10/2022) 20 capsule 0   No current facility-administered medications on file prior to visit.     Allergies  Allergen Reactions   Penicillins    Social History   Socioeconomic History   Marital status: Single    Spouse name: Not on file   Number of children: Not on file   Years of education: Not on file   Highest education level: Not on file  Occupational History   Not on file  Tobacco Use   Smoking status: Never   Smokeless tobacco: Never  Substance and Sexual Activity   Alcohol use: No   Drug use: No   Sexual activity: Yes    Comment: married, bus driver  Other Topics Concern   Not on file  Social History Narrative   Not on file   Social Determinants of Health   Financial Resource Strain: Low Risk  (05/01/2021)   Overall Financial Resource Strain (CARDIA)    Difficulty of Paying Living Expenses: Not hard at all  Food Insecurity: No Food Insecurity (05/01/2021)   Hunger Vital Sign    Worried About Running Out of Food in the Last Year: Never true    Ran Out of Food in the Last Year: Never true  Transportation Needs: No Transportation Needs (05/01/2021)   PRAPARE - Administrator, Civil Service (Medical): No    Lack of Transportation (Non-Medical): No  Physical Activity: Sufficiently Active (05/01/2021)   Exercise Vital Sign    Days of Exercise per Week: 5 days    Minutes of Exercise per Session: 40 min  Stress: No Stress Concern Present (05/01/2021)   [employer] of Occupational Health - Occupational Stress Questionnaire    Feeling of Stress : Not at all  Social Connections: Moderately Integrated (05/01/2021)   Social Connection and Isolation Panel [NHANES]    Frequency of Communication with Friends and Family: More than three times a week    Frequency of Social Gatherings with Friends and Family: Three times a week    Attends Religious Services: More than 4 times per year    Active Member of Clubs or Organizations: No    Attends Banker Meetings: Never    Marital Status: Married  Catering manager Violence: Not At Risk  (05/01/2021)   Humiliation, Afraid, Rape, and Kick questionnaire    Fear of Current or Ex-Partner: No    Emotionally Abused: No    Physically Abused: No    Sexually Abused: No   Family History  Problem Relation Age of Onset   Heart disease Brother    Stroke Brother      Review of Systems  Musculoskeletal:  Positive for back pain.  All other systems reviewed and are negative.      Objective:   Physical Exam Vitals reviewed.  Constitutional:      General: He is not in acute distress.    Appearance: He is well-developed. He is not diaphoretic.  HENT:     Head: Normocephalic and atraumatic.  Neck:     Thyroid: No thyromegaly.  Cardiovascular:     Rate and Rhythm: Normal rate and regular rhythm.     Heart sounds: Normal heart sounds. No murmur heard.    No friction rub. No gallop.  Pulmonary:     Effort: Pulmonary effort is normal. No respiratory distress.     Breath sounds: Normal breath sounds. No wheezing or rales.  Chest:     Chest wall: No tenderness.  Abdominal:     General: Bowel sounds are normal.     Palpations: Abdomen is soft.  Musculoskeletal:     Right shoulder: Crepitus present. No effusion. Decreased range of motion. Normal strength.     Left shoulder: Normal range of motion.     Cervical back: Normal range of motion and neck supple. Tenderness present. No bony tenderness.       Back:     Right knee: Bony tenderness present. No swelling or effusion. Decreased range of motion. Tenderness present over the medial joint line and lateral joint line.  Skin:    General: Skin is warm.     Coloration: Skin is not pale.     Findings: No erythema or rash.  Neurological:     Motor: No abnormal muscle tone.     Deep Tendon Reflexes: Reflexes are normal and symmetric.          Assessment & Plan:    Acute pain of right knee - Plan: DG Knee Complete 4 Views Right  Chronic right shoulder pain Try diclofenac 75 mg twice daily for the next 2 to 3 weeks.  If  pain is not improving, I would like him to get an x-ray of the right knee.  He can return at any time for cortisone injection in his shoulder or his knee.  I suspect osteoarthritis in the right knee and subacromial bursitis in the right shoulder.  He also has onychomycosis on the second third and fourth fingernails of his left hand.  I will be glad to treat this with Lamisil but I want him off the diclofenac before he starts taking the Lamisil.  He will call me back if he decides he wants to take the Lamisil

## 2022-09-04 ENCOUNTER — Other Ambulatory Visit: Payer: Self-pay | Admitting: Family Medicine

## 2022-09-04 ENCOUNTER — Telehealth: Payer: Self-pay

## 2022-09-04 MED ORDER — TERBINAFINE HCL 250 MG PO TABS: 250.0000 mg | ORAL_TABLET | Freq: Every day | ORAL | 1 refills | Status: DC

## 2022-09-04 NOTE — Telephone Encounter (Signed)
Pt called asking for an Rx for the Lamisil for nail fungus? Thank you.

## 2022-09-24 ENCOUNTER — Other Ambulatory Visit: Payer: Self-pay

## 2022-09-24 MED ORDER — PRAVASTATIN SODIUM 20 MG PO TABS: 20.0000 mg | ORAL_TABLET | Freq: Every day | ORAL | 0 refills | Status: DC

## 2022-09-24 NOTE — Telephone Encounter (Signed)
Pt needs physical w/pcp for future refills,  pt given 30 pills .

## 2022-09-24 NOTE — Telephone Encounter (Signed)
Requested medications are due for refill today.  yes  Requested medications are on the active medications list.  yes  Last refill. 09/22/2021 #90 3 rf  Future visit scheduled.   no  Notes to clinic.  Lab are expired.    Requested Prescriptions  Pending Prescriptions Disp Refills   pravastatin (PRAVACHOL) 20 MG tablet 90 tablet 3    Sig: Take 1 tablet (20 mg total) by mouth daily.     Cardiovascular:  Antilipid - Statins Failed - 09/24/2022 10:28 AM      Failed - Lipid Panel in normal range within the last 12 months    Cholesterol  Date Value Ref Range Status  05/19/2021 147 <200 mg/dL Final   LDL Cholesterol (Calc)  Date Value Ref Range Status  05/19/2021 80 mg/dL (calc) Final    Comment:    Reference range: <100 . Desirable range <100 mg/dL for primary prevention;   <70 mg/dL for patients with CHD or diabetic patients  with > or = 2 CHD risk factors. Marland Kitchen LDL-C is now calculated using the Martin-Hopkins  calculation, which is a validated novel method providing  better accuracy than the Friedewald equation in the  estimation of LDL-C.  Cresenciano Genre et al. Annamaria Helling. 7564;332(95): 2061-2068  (http://education.Quest[employer].com/faq/FAQ164)    HDL  Date Value Ref Range Status  05/19/2021 37 (L) > OR = 40 mg/dL Final   Triglycerides  Date Value Ref Range Status  05/19/2021 205 (H) <150 mg/dL Final    Comment:    . If a non-fasting specimen was collected, consider repeat triglyceride testing on a fasting specimen if clinically indicated.  Yates Decamp et al. J. of Clin. Lipidol. 1884;1:660-630. Marland Kitchen          Passed - Patient is not pregnant      Passed - Valid encounter within last 12 months    Recent Outpatient Visits           6 months ago Acute frontal sinusitis, recurrence not specified   [place] Susy Frizzle, MD   1 year ago Diabetes mellitus type 2 in nonobese [facility])   [place] Susy Frizzle, MD   1 year ago  Diabetes mellitus type 2 in nonobese [facility])   [place] Susy Frizzle, MD   2 years ago BPH with elevated PSA   [place] Susy Frizzle, MD   2 years ago Chronic right shoulder pain   [place] Pickard, Cammie Mcgee, MD

## 2022-09-24 NOTE — Telephone Encounter (Addendum)
Received eFax from pharmacy to request refill of or new script forpravastatin (PRAVACHOL) 20 MG tablet [[id number]]    Order Details Dose: 20 mg Route: Oral Frequency: Daily  Dispense Quantity: 90 tablet Refills: 3        Sig: Take 1 tablet (20 mg total) by mouth daily.       Start Date: 09/22/21 End Date: --  Written Date: 09/22/21 Expiration Date: 10/17/2   .  Pharmacy: [facility] [id number] [place], [place]: 07/10/22  Please advise pharmacist

## 2022-09-29 NOTE — Telephone Encounter (Signed)
Left message to return call 

## 2022-09-30 DIAGNOSIS — R972 Elevated prostate specific antigen [PSA]: Secondary | ICD-10-CM | POA: Diagnosis not present

## 2022-10-02 DIAGNOSIS — J1189 Influenza due to unidentified influenza virus with other manifestations: Secondary | ICD-10-CM | POA: Diagnosis not present

## 2022-10-07 DIAGNOSIS — N401 Enlarged prostate with lower urinary tract symptoms: Secondary | ICD-10-CM | POA: Diagnosis not present

## 2022-10-07 DIAGNOSIS — R972 Elevated prostate specific antigen [PSA]: Secondary | ICD-10-CM | POA: Diagnosis not present

## 2022-10-07 DIAGNOSIS — R350 Frequency of micturition: Secondary | ICD-10-CM | POA: Diagnosis not present

## 2022-10-09 ENCOUNTER — Other Ambulatory Visit: Payer: Self-pay | Admitting: Urology

## 2022-10-09 DIAGNOSIS — R972 Elevated prostate specific antigen [PSA]: Secondary | ICD-10-CM

## 2022-10-19 ENCOUNTER — Ambulatory Visit (INDEPENDENT_AMBULATORY_CARE_PROVIDER_SITE_OTHER): Payer: Medicare HMO | Admitting: Family Medicine

## 2022-10-19 ENCOUNTER — Encounter: Payer: Self-pay | Admitting: Family Medicine

## 2022-10-19 VITALS — BP 118/74 | HR 59 | Ht 68.0 in | Wt 142.0 lb

## 2022-10-19 DIAGNOSIS — E119 Type 2 diabetes mellitus without complications: Secondary | ICD-10-CM

## 2022-10-19 LAB — COMPLETE METABOLIC PANEL WITH GFR
AG Ratio: 1.5 (calc) (ref 1.0–2.5)
ALT: 13 U/L (ref 9–46)
AST: 16 U/L (ref 10–35)
Albumin: 4.3 g/dL (ref 3.6–5.1)
Alkaline phosphatase (APISO): 84 U/L (ref 35–144)
BUN: 20 mg/dL (ref 7–25)
CO2: 31 mmol/L (ref 20–32)
Calcium: 9.7 mg/dL (ref 8.6–10.3)
Chloride: 103 mmol/L (ref 98–110)
Creat: 0.93 mg/dL (ref 0.70–1.28)
Globulin: 2.8 g/dL (calc) (ref 1.9–3.7)
Glucose, Bld: 109 mg/dL — ABNORMAL HIGH (ref 65–99)
Potassium: 4.9 mmol/L (ref 3.5–5.3)
Sodium: 140 mmol/L (ref 135–146)
Total Bilirubin: 0.6 mg/dL (ref 0.2–1.2)
Total Protein: 7.1 g/dL (ref 6.1–8.1)
eGFR: 84 mL/min/{1.73_m2} (ref >=60)

## 2022-10-19 LAB — HEMOGLOBIN A1C
Hgb A1c MFr Bld: 6.4 % of total Hgb — ABNORMAL HIGH (ref <5.7)
Mean Plasma Glucose: 137 mg/dL
eAG (mmol/L): 7.6 mmol/L

## 2022-10-19 LAB — LIPID PANEL
Cholesterol: 181 mg/dL (ref <200)
HDL: 42 mg/dL (ref >=40)
LDL Cholesterol (Calc): 109 mg/dL (calc) — ABNORMAL HIGH
Non-HDL Cholesterol (Calc): 139 mg/dL (calc) — ABNORMAL HIGH (ref <130)
Total CHOL/HDL Ratio: 4.3 (calc) (ref <5.0)
Triglycerides: 188 mg/dL — ABNORMAL HIGH (ref <150)

## 2022-10-19 LAB — PROTEIN / CREATININE RATIO, URINE
Creatinine, Urine: 65 mg/dL (ref 20–320)
Protein/Creat Ratio: 77 mg/g creat (ref 25–148)
Protein/Creatinine Ratio: 0.077 mg/mg creat (ref 0.025–0.148)
Total Protein, Urine: 5 mg/dL (ref 5–25)

## 2022-10-19 MED ORDER — TERBINAFINE HCL 250 MG PO TABS: 250.0000 mg | ORAL_TABLET | Freq: Every day | ORAL | 1 refills | Status: DC

## 2022-10-19 NOTE — Progress Notes (Signed)
Subjective:    Patient ID: Bryan Mccullough, male    DOB: 1941/02/02, 81 y.o.   MRN: [id number]  Patient is a very pleasant 81 year old Hispanic gentleman who presents today complaining of bilateral shoulder pain.  Currently taking diclofenac once a day controls the pain.  X-ray showed mild arthritis in the [facility] joints bilaterally.  He denies any weakness.  He denies any improvement when he tried physical therapy in the past.  He denies any stomach upset from taking the diclofenac.  His blood pressure today is outstanding.  He is due to check fasting lab work to monitor his diabetes. Past Medical History:  Diagnosis Date   BPH with elevated PSA    Diabetes mellitus type 2 in nonobese [facility])    Diverticulosis    Erectile dysfunction    Esophageal stricture    GERD (gastroesophageal reflux disease)    Hiatal hernia    HLD (hyperlipidemia)    Past Surgical History:  Procedure Laterality Date   PROSTATE SURGERY     Current Outpatient Medications on File Prior to Visit  Medication Sig Dispense Refill   cefdinir (OMNICEF) 300 MG capsule Take 1 capsule (300 mg total) by mouth 2 (two) times daily. (Patient not taking: Reported on 07/10/2022) 20 capsule 0   diclofenac (VOLTAREN) 75 MG EC tablet Take 1 tablet (75 mg total) by mouth 2 (two) times daily. 60 tablet 2   fluticasone (FLONASE) 50 MCG/ACT nasal spray Place 2 sprays into both nostrils daily. 16 g 6   metFORMIN (GLUCOPHAGE) 500 MG tablet TAKE 1 TABLET BY MOUTH TWICE DAILY WITH A MEAL 180 tablet 2   pantoprazole (PROTONIX) 40 MG tablet Take 1 tablet (40 mg total) by mouth daily. 90 tablet 1   pravastatin (PRAVACHOL) 20 MG tablet Take 1 tablet (20 mg total) by mouth daily. 30 tablet 0   senna-docusate (SENOKOT-S) 8.6-50 MG tablet Take 2 tablets by mouth daily. 60 tablet 3   No current facility-administered medications on file prior to visit.    Allergies  Allergen Reactions   Penicillins    Social History   Socioeconomic History    Marital status: Single    Spouse name: Not on file   Number of children: Not on file   Years of education: Not on file   Highest education level: Not on file  Occupational History   Not on file  Tobacco Use   Smoking status: Never   Smokeless tobacco: Never  Substance and Sexual Activity   Alcohol use: No   Drug use: No   Sexual activity: Yes    Comment: married, bus driver  Other Topics Concern   Not on file  Social History Narrative   Not on file   Social Determinants of Health   Financial Resource Strain: Low Risk  (05/01/2021)   Overall Financial Resource Strain (CARDIA)    Difficulty of Paying Living Expenses: Not hard at all  Food Insecurity: No Food Insecurity (05/01/2021)   Hunger Vital Sign    Worried About Running Out of Food in the Last Year: Never true    [place] in the Last Year: Never true  Transportation Needs: No Transportation Needs (05/01/2021)   PRAPARE - Hydrologist (Medical): No    Lack of Transportation (Non-Medical): No  Physical Activity: Sufficiently Active (05/01/2021)   Exercise Vital Sign    Days of Exercise per Week: 5 days    Minutes of Exercise per Session: 40 min  Stress: No Stress Concern Present (05/01/2021)   [employer] of Occupational Health - Occupational Stress Questionnaire    Feeling of Stress : Not at all  Social Connections: Moderately Integrated (05/01/2021)   Social Connection and Isolation Panel [NHANES]    Frequency of Communication with Friends and Family: More than three times a week    Frequency of Social Gatherings with Friends and Family: Three times a week    Attends Religious Services: More than 4 times per year    Active Member of Clubs or Organizations: No    Attends Banker Meetings: Never    Marital Status: Married  Catering manager Violence: Not At Risk (05/01/2021)   Humiliation, Afraid, Rape, and Kick questionnaire    Fear of Current or Ex-Partner: No     Emotionally Abused: No    Physically Abused: No    Sexually Abused: No   Family History  Problem Relation Age of Onset   Heart disease Brother    Stroke Brother      Review of Systems  Musculoskeletal:  Positive for back pain.  All other systems reviewed and are negative.      Objective:   Physical Exam Vitals reviewed.  Constitutional:      General: He is not in acute distress.    Appearance: He is well-developed. He is not diaphoretic.  HENT:     Head: Normocephalic and atraumatic.  Neck:     Thyroid: No thyromegaly.  Cardiovascular:     Rate and Rhythm: Normal rate and regular rhythm.     Heart sounds: Normal heart sounds. No murmur heard.    No friction rub. No gallop.  Pulmonary:     Effort: Pulmonary effort is normal. No respiratory distress.     Breath sounds: Normal breath sounds. No wheezing or rales.  Chest:     Chest wall: No tenderness.  Abdominal:     General: Bowel sounds are normal.     Palpations: Abdomen is soft.  Musculoskeletal:     Right shoulder: Crepitus present. No effusion. Decreased range of motion. Normal strength.     Left shoulder: Crepitus present. No tenderness. Decreased range of motion. Normal strength.     Cervical back: Normal range of motion and neck supple. Tenderness present. No bony tenderness.       Back:     Right knee: No swelling or effusion. Normal range of motion. No medial joint line or lateral joint line tenderness.  Skin:    General: Skin is warm.     Coloration: Skin is not pale.     Findings: No erythema or rash.  Neurological:     Motor: No abnormal muscle tone.     Deep Tendon Reflexes: Reflexes are normal and symmetric.          Assessment & Plan:    Diabetes mellitus type 2 in nonobese (HCC) - Plan: Hemoglobin A1c, COMPLETE METABOLIC PANEL WITH GFR, Lipid panel, Protein / Creatinine Ratio, Urine Blood pressure today is well controlled.  Check CBC, CMP, lipid panel, A1c, and urine microalbumin.  I like  to see his LDL cholesterol below 100 and his A1c below 6.5 and his albumin to creatinine ratio less than 30.  We discussed options for his chronic shoulder pain which I believe is due to tendinopathy in his rotator cuff.  I offered the patient an MRI versus physical therapy versus a cortisone injection.  After discussing the risk and benefits of all these options, the patient  elected to try diclofenac 75 mg twice a day.

## 2022-10-28 ENCOUNTER — Telehealth: Payer: Self-pay | Admitting: Family Medicine

## 2022-10-28 ENCOUNTER — Other Ambulatory Visit: Payer: Self-pay

## 2022-10-28 MED ORDER — PRAVASTATIN SODIUM 20 MG PO TABS: 20.0000 mg | ORAL_TABLET | Freq: Every day | ORAL | 3 refills | Status: DC

## 2022-10-28 NOTE — Telephone Encounter (Signed)
No answer unable to leave a message for patient to call back and schedule Medicare Annual Wellness Visit (AWV) in office.   If not able to come in office, please offer to do virtually or by telephone.   Last AWV:05/01/2021   Please schedule at anytime with [facility] Toni Amend  If any questions, please contact me at [phone].  Thank you ,  Judeth Cornfield

## 2022-10-28 NOTE — Telephone Encounter (Signed)
  Prescription Request  10/28/2022  Is this a "Controlled Substance" medicine? No  LOV: 10/28/2022   What is the name of the medication or equipment? pravastatin (PRAVACHOL) 20 MG tablet [[id number]]   Have you contacted your pharmacy to request a refill? Yes   Which pharmacy would you like this sent to?  [facility] [id number] [place], [place] - [address] [facility] [address] [place] [address] [place] [address] Phone: [phone] Fax: [phone]   Patient notified that their request is being sent to the clinical staff for review and that they should receive a response within 2 business days.   Please advise at [phone]

## 2022-10-28 NOTE — Telephone Encounter (Signed)
Requested Prescriptions  Pending Prescriptions Disp Refills   pravastatin (PRAVACHOL) 20 MG tablet 90 tablet 3    Sig: Take 1 tablet (20 mg total) by mouth daily.     Cardiovascular:  Antilipid - Statins Failed - 10/28/2022 11:32 AM      Failed - Lipid Panel in normal range within the last 12 months    Cholesterol  Date Value Ref Range Status  10/19/2022 181 <200 mg/dL Final   LDL Cholesterol (Calc)  Date Value Ref Range Status  10/19/2022 109 (H) mg/dL (calc) Final    Comment:    Reference range: <100 . Desirable range <100 mg/dL for primary prevention;   <70 mg/dL for patients with CHD or diabetic patients  with > or = 2 CHD risk factors. Marland Kitchen LDL-C is now calculated using the Martin-Hopkins  calculation, which is a validated novel method providing  better accuracy than the Friedewald equation in the  estimation of LDL-C.  Horald Pollen et al. Lenox Ahr. 8185;631(49): 2061-2068  (http://education.[facility].com/faq/FAQ164)    HDL  Date Value Ref Range Status  10/19/2022 42 > OR = 40 mg/dL Final   Triglycerides  Date Value Ref Range Status  10/19/2022 188 (H) <150 mg/dL Final         Passed - Patient is not pregnant      Passed - Valid encounter within last 12 months    Recent Outpatient Visits           7 months ago Acute frontal sinusitis, recurrence not specified   [facility] Donita Brooks, MD   1 year ago Diabetes mellitus type 2 in nonobese [facility])   [facility] Donita Brooks, MD   1 year ago Diabetes mellitus type 2 in nonobese [facility])   [facility] Donita Brooks, MD   2 years ago BPH with elevated PSA   [facility] Donita Brooks, MD   2 years ago Chronic right shoulder pain   [facility] Pickard, Priscille Heidelberg, MD

## 2022-10-28 NOTE — Telephone Encounter (Signed)
Patient requesting call back with lab results. Please advise at [phone]

## 2022-11-02 ENCOUNTER — Ambulatory Visit
Admission: RE | Admit: 2022-11-02 | Discharge: 2022-11-02 | Disposition: A | Discharge status: Home / Self Care | Payer: Medicare HMO | Source: Ambulatory Visit | Attending: Urology | Admitting: Urology

## 2022-11-02 DIAGNOSIS — R972 Elevated prostate specific antigen [PSA]: Secondary | ICD-10-CM

## 2022-11-02 MED ORDER — GADOPICLENOL 0.5 MMOL/ML IV SOLN
7.0000 mL | Freq: Once | INTRAVENOUS | Status: AC | PRN
  Administered 2022-11-02: 7 mL via INTRAVENOUS

## 2022-11-02 MED ORDER — GADOPICLENOL 0.5 MMOL/ML IV SOLN: 7.0000 mL | Freq: Once | INTRAVENOUS | Status: DC | PRN

## 2022-12-18 DIAGNOSIS — R972 Elevated prostate specific antigen [PSA]: Secondary | ICD-10-CM | POA: Diagnosis not present

## 2022-12-25 DIAGNOSIS — R972 Elevated prostate specific antigen [PSA]: Secondary | ICD-10-CM | POA: Diagnosis not present

## 2022-12-25 DIAGNOSIS — N401 Enlarged prostate with lower urinary tract symptoms: Secondary | ICD-10-CM | POA: Diagnosis not present

## 2022-12-25 DIAGNOSIS — R350 Frequency of micturition: Secondary | ICD-10-CM | POA: Diagnosis not present

## 2022-12-29 ENCOUNTER — Telehealth: Payer: Self-pay

## 2022-12-29 NOTE — Telephone Encounter (Signed)
Prescription Request  12/29/2022  Is this a "Controlled Substance" medicine? No  LOV: 10/19/22  What is the name of the medication or equipment? metFORMIN (GLUCOPHAGE) 500  Have you contacted your pharmacy to request a refill? Yes   Which pharmacy would you like this sent to?  [facility] [id number], [place] [address] Phone: [phone] Fax: [phone]    Patient notified that their request is being sent to the clinical staff for review and that they should receive a response within 2 business days.   Please advise at [facility] [phone]

## 2022-12-31 DIAGNOSIS — R972 Elevated prostate specific antigen [PSA]: Secondary | ICD-10-CM | POA: Diagnosis not present

## 2023-01-04 ENCOUNTER — Other Ambulatory Visit: Payer: Self-pay

## 2023-01-04 ENCOUNTER — Telehealth: Payer: Self-pay | Admitting: Family Medicine

## 2023-01-04 DIAGNOSIS — E119 Type 2 diabetes mellitus without complications: Secondary | ICD-10-CM

## 2023-01-04 MED ORDER — METFORMIN HCL 500 MG PO TABS: ORAL_TABLET | ORAL | 2 refills | Status: DC

## 2023-01-04 NOTE — Telephone Encounter (Signed)
No answer unable to leave a  message for patient to call back and schedule Medicare Annual Wellness Visit (AWV) in office.   If not able to come in office, please offer to do virtually or by telephone.   Last AWV:  05/01/2021    Please schedule at any time with [facility]Nurse Health Advisor.  30 minute appointment  Any questions, please contact me at [phone]   Thank you,   [facility]  Ambulatory Clinical Support for [place] [facility] ??[phone] or ??[phone]

## 2023-02-05 ENCOUNTER — Other Ambulatory Visit: Payer: Self-pay | Admitting: Family Medicine

## 2023-02-05 NOTE — Telephone Encounter (Signed)
Prescription Request  02/05/2023   LOV: 10/19/2022  What is the name of the medication or equipment? fluticasone (FLONASE) 50 MCG/ACT nasal spray [   Have you contacted your pharmacy to request a refill? Yes   Which pharmacy would you like this sent to?  [id number], [place] [address] [place] [address] Phone: [phone] Fax: [phone]    Patient notified that their request is being sent to the clinical staff for review and that they should receive a response within 2 business days.   Please advise at [facility] [phone]

## 2023-02-08 MED ORDER — FLUTICASONE PROPIONATE 50 MCG/ACT NA SUSP: 2.0000 | Freq: Every day | NASAL | 0 refills | Status: DC

## 2023-02-08 NOTE — Telephone Encounter (Signed)
Requested Prescriptions  Pending Prescriptions Disp Refills   fluticasone (FLONASE) 50 MCG/ACT nasal spray 16 g 0    Sig: Place 2 sprays into both nostrils daily.     Ear, Nose, and Throat: Nasal Preparations - Corticosteroids Passed - 02/05/2023  3:51 PM      Passed - Valid encounter within last 12 months    Recent Outpatient Visits           11 months ago Acute frontal sinusitis, recurrence not specified   [place] Susy Frizzle, MD   1 year ago Diabetes mellitus type 2 in nonobese [facility])   [place] Susy Frizzle, MD   2 years ago Diabetes mellitus type 2 in nonobese [facility])   [place] Susy Frizzle, MD   2 years ago BPH with elevated PSA   [place] Susy Frizzle, MD   2 years ago Chronic right shoulder pain   [place] Pickard, Cammie Mcgee, MD

## 2023-02-19 ENCOUNTER — Encounter: Payer: Self-pay | Admitting: Family Medicine

## 2023-02-19 ENCOUNTER — Ambulatory Visit (INDEPENDENT_AMBULATORY_CARE_PROVIDER_SITE_OTHER): Payer: Medicare HMO | Admitting: Family Medicine

## 2023-02-19 VITALS — BP 132/82 | HR 81 | Temp 98.4°F | Ht 68.0 in | Wt 145.2 lb

## 2023-02-19 DIAGNOSIS — E119 Type 2 diabetes mellitus without complications: Secondary | ICD-10-CM

## 2023-02-19 DIAGNOSIS — G8929 Other chronic pain: Secondary | ICD-10-CM | POA: Diagnosis not present

## 2023-02-19 DIAGNOSIS — M25511 Pain in right shoulder: Secondary | ICD-10-CM | POA: Diagnosis not present

## 2023-02-19 MED ORDER — METFORMIN HCL ER 500 MG PO TB24: 1000.0000 mg | ORAL_TABLET | Freq: Every day | ORAL | 3 refills | Status: AC

## 2023-02-19 NOTE — Progress Notes (Signed)
Subjective:    Patient ID: Bryan Mccullough, male    DOB: [birth year], 82 y.o.   MRN: [id number]  Patient continues to endorse pain in both shoulders.  He is not taking diclofenac regularly.  He states that he forgets to take it.  His range of motion in his left shoulder is quite good however he has pain with abduction greater than 100 degrees in his right shoulder.  He also complains of pain with external rotation.  He states that it hurts more at night.  He states that has been bothering him more after he fell.  He was cutting a branch with a saw.  He fell and he jerked his arm backward and external rotation as he fell.  Since that time he has had more persistent pain. Past Medical History:  Diagnosis Date   BPH with elevated PSA    Diabetes mellitus type 2 in nonobese [facility])    Diverticulosis    Erectile dysfunction    Esophageal stricture    GERD (gastroesophageal reflux disease)    Hiatal hernia    HLD (hyperlipidemia)    Past Surgical History:  Procedure Laterality Date   PROSTATE SURGERY     Current Outpatient Medications on File Prior to Visit  Medication Sig Dispense Refill   ABRYSVO 120 MCG/0.5ML injection      cefdinir (OMNICEF) 300 MG capsule Take 1 capsule (300 mg total) by mouth 2 (two) times daily. (Patient not taking: Reported on 07/10/2022) 20 capsule 0   COMIRNATY syringe      diclofenac (VOLTAREN) 75 MG EC tablet Take 1 tablet (75 mg total) by mouth 2 (two) times daily. 60 tablet 2   finasteride (PROSCAR) 5 MG tablet Take 5 mg by mouth daily.     fluticasone (FLONASE) 50 MCG/ACT nasal spray Place 2 sprays into both nostrils daily. 16 g 0   FLUZONE HIGH-DOSE QUADRIVALENT 0.7 ML SUSY      metFORMIN (GLUCOPHAGE) 500 MG tablet TAKE 1 TABLET BY MOUTH TWICE DAILY WITH A MEAL 180 tablet 2   pantoprazole (PROTONIX) 40 MG tablet Take 1 tablet (40 mg total) by mouth daily. 90 tablet 1   pravastatin (PRAVACHOL) 20 MG tablet Take 1 tablet (20 mg total) by mouth daily. 90 tablet 3    senna-docusate (SENOKOT-S) 8.6-50 MG tablet Take 2 tablets by mouth daily. 60 tablet 3   tamsulosin (FLOMAX) 0.4 MG CAPS capsule Take 0.4 mg by mouth daily.     terbinafine (LAMISIL) 250 MG tablet Take 1 tablet (250 mg total) by mouth daily. 30 tablet 1   No current facility-administered medications on file prior to visit.    Allergies  Allergen Reactions   Penicillins    Social History   Socioeconomic History   Marital status: Single    Spouse name: Not on file   Number of children: Not on file   Years of education: Not on file   Highest education level: Not on file  Occupational History   Not on file  Tobacco Use   Smoking status: Never   Smokeless tobacco: Never  Substance and Sexual Activity   Alcohol use: No   Drug use: No   Sexual activity: Yes    Comment: married, bus driver  Other Topics Concern   Not on file  Social History Narrative   Not on file   Social Determinants of Health   Financial Resource Strain: Low Risk  (05/01/2021)   Overall Financial Resource Strain (CARDIA)    Difficulty  of Paying Living Expenses: Not hard at all  Food Insecurity: No Food Insecurity (05/01/2021)   Hunger Vital Sign    Worried About Running Out of Food in the Last Year: Never true    Ran Out of Food in the Last Year: Never true  Transportation Needs: No Transportation Needs (05/01/2021)   PRAPARE - Hydrologist (Medical): No    Lack of Transportation (Non-Medical): No  Physical Activity: Sufficiently Active (05/01/2021)   Exercise Vital Sign    Days of Exercise per Week: 5 days    Minutes of Exercise per Session: 40 min  Stress: No Stress Concern Present (05/01/2021)   [place]    Feeling of Stress : Not at all  Social Connections: Moderately Integrated (05/01/2021)   Social Connection and Isolation Panel [NHANES]    Frequency of Communication with Friends and Family: More than  three times a week    Frequency of Social Gatherings with Friends and Family: Three times a week    Attends Religious Services: More than 4 times per year    Active Member of Clubs or Organizations: No    Attends Archivist Meetings: Never    Marital Status: Married  Human resources officer Violence: Not At Risk (05/01/2021)   Humiliation, Afraid, Rape, and Kick questionnaire    Fear of Current or Ex-Partner: No    Emotionally Abused: No    Physically Abused: No    Sexually Abused: No   Family History  Problem Relation Age of Onset   Heart disease Brother    Stroke Brother      Review of Systems  Musculoskeletal:  Positive for back pain.  All other systems reviewed and are negative.      Objective:   Physical Exam Vitals reviewed.  Constitutional:      General: He is not in acute distress.    Appearance: He is well-developed. He is not diaphoretic.  HENT:     Head: Normocephalic and atraumatic.  Neck:     Thyroid: No thyromegaly.  Cardiovascular:     Rate and Rhythm: Normal rate and regular rhythm.     Heart sounds: Normal heart sounds. No murmur heard.    No friction rub. No gallop.  Pulmonary:     Effort: Pulmonary effort is normal. No respiratory distress.     Breath sounds: Normal breath sounds. No wheezing or rales.  Chest:     Chest wall: No tenderness.  Abdominal:     General: Bowel sounds are normal.     Palpations: Abdomen is soft.  Musculoskeletal:     Right shoulder: Crepitus present. No effusion. Decreased range of motion. Normal strength.     Left shoulder: Crepitus present. No tenderness. Decreased range of motion. Normal strength.     Cervical back: Normal range of motion and neck supple. Tenderness present. No bony tenderness.       Back:     Right knee: No swelling or effusion. Normal range of motion. No medial joint line or lateral joint line tenderness.  Skin:    General: Skin is warm.     Coloration: Skin is not pale.     Findings: No  erythema or rash.  Neurological:     Motor: No abnormal muscle tone.     Deep Tendon Reflexes: Reflexes are normal and symmetric.          Assessment & Plan:   Diabetes mellitus type  2 in nonobese ([place]) - Plan: Hemoglobin A1c, COMPLETE METABOLIC PANEL WITH GFR  Chronic right shoulder pain He is complaining of shoulder pain for quite some time however this is more likely to be rotator cuff particular given the mechanism of action.  Using sterile technique, I injected the right subacromial space with 2 cc of lidocaine, 2 cc of Marcaine, and 2 cc of 40 mg/mL Kenalog.  He tolerated the procedure well without complication.  I will repeat his A1c which was 6.4 in November.  He does not want to take the metformin twice a day therefore I will transition him to metformin extended release 1000 mg p.o. every morning to provide 24-hour coverage

## 2023-02-20 LAB — COMPLETE METABOLIC PANEL WITH GFR
AG Ratio: 1.6 (calc) (ref 1.0–2.5)
ALT: 14 U/L (ref 9–46)
AST: 15 U/L (ref 10–35)
Albumin: 4.5 g/dL (ref 3.6–5.1)
Alkaline phosphatase (APISO): 80 U/L (ref 35–144)
BUN: 24 mg/dL (ref 7–25)
CO2: 28 mmol/L (ref 20–32)
Calcium: 9.5 mg/dL (ref 8.6–10.3)
Chloride: 106 mmol/L (ref 98–110)
Creat: 1.03 mg/dL (ref 0.70–1.22)
Globulin: 2.9 g/dL (calc) (ref 1.9–3.7)
Glucose, Bld: 123 mg/dL — ABNORMAL HIGH (ref 65–99)
Potassium: 4.2 mmol/L (ref 3.5–5.3)
Sodium: 139 mmol/L (ref 135–146)
Total Bilirubin: 0.4 mg/dL (ref 0.2–1.2)
Total Protein: 7.4 g/dL (ref 6.1–8.1)
eGFR: 73 mL/min/{1.73_m2} (ref >=60)

## 2023-02-20 LAB — HEMOGLOBIN A1C
Hgb A1c MFr Bld: 6.5 % of total Hgb — ABNORMAL HIGH (ref <5.7)
Mean Plasma Glucose: 140 mg/dL
eAG (mmol/L): 7.7 mmol/L

## 2023-02-22 ENCOUNTER — Telehealth: Payer: Self-pay

## 2023-02-22 NOTE — Telephone Encounter (Signed)
Insurance denied the Metformin extended release. Pt asks if it is okay for him to take the Metformin he was on two 500 mg tablets in the morning? Thank you!

## 2023-03-15 ENCOUNTER — Telehealth: Payer: Self-pay | Admitting: Family Medicine

## 2023-03-15 NOTE — Telephone Encounter (Signed)
Called patient to schedule Medicare Annual Wellness Visit (AWV). No voicemail available to leave a message.  Last date of AWV: 05/01/2021   Please schedule an appointment at any time with Toni Amend, [facility] .  If any questions, please contact me at [phone].  Thank you,  Judeth Cornfield,  AMB Clinical Support [facility] AWV Program Direct Dial ??[phone]

## 2023-03-18 DIAGNOSIS — R972 Elevated prostate specific antigen [PSA]: Secondary | ICD-10-CM | POA: Diagnosis not present

## 2023-03-25 DIAGNOSIS — N401 Enlarged prostate with lower urinary tract symptoms: Secondary | ICD-10-CM | POA: Diagnosis not present

## 2023-03-25 DIAGNOSIS — R972 Elevated prostate specific antigen [PSA]: Secondary | ICD-10-CM | POA: Diagnosis not present

## 2023-03-25 DIAGNOSIS — R350 Frequency of micturition: Secondary | ICD-10-CM | POA: Diagnosis not present

## 2023-06-16 DIAGNOSIS — R972 Elevated prostate specific antigen [PSA]: Secondary | ICD-10-CM | POA: Diagnosis not present

## 2023-06-21 ENCOUNTER — Ambulatory Visit (INDEPENDENT_AMBULATORY_CARE_PROVIDER_SITE_OTHER): Payer: Medicare HMO | Admitting: Family Medicine

## 2023-06-21 ENCOUNTER — Encounter: Payer: Self-pay | Admitting: Family Medicine

## 2023-06-21 VITALS — BP 136/82 | HR 73 | Temp 97.8°F | Ht 68.0 in | Wt 138.6 lb

## 2023-06-21 DIAGNOSIS — M25512 Pain in left shoulder: Secondary | ICD-10-CM | POA: Diagnosis not present

## 2023-06-21 DIAGNOSIS — G8929 Other chronic pain: Secondary | ICD-10-CM

## 2023-06-21 DIAGNOSIS — M12812 Other specific arthropathies, not elsewhere classified, left shoulder: Secondary | ICD-10-CM

## 2023-06-21 DIAGNOSIS — M25511 Pain in right shoulder: Secondary | ICD-10-CM | POA: Diagnosis not present

## 2023-06-21 DIAGNOSIS — M12811 Other specific arthropathies, not elsewhere classified, right shoulder: Secondary | ICD-10-CM

## 2023-06-21 NOTE — Progress Notes (Signed)
Subjective:    Patient ID: Bryan Mccullough, male    DOB: November 23, 1941, 82 y.o.   MRN: [id number] 02/18/23 Patient continues to endorse pain in both shoulders.  He is not taking diclofenac regularly.  He states that he forgets to take it.  His range of motion in his left shoulder is quite good however he has pain with abduction greater than 100 degrees in his right shoulder.  He also complains of pain with external rotation.  He states that it hurts more at night.  He states that has been bothering him more after he fell.  He was cutting a branch with a saw.  He fell and he jerked his arm backward and external rotation as he fell.  Since that time he has had more persistent pain.  At that time, my plan was: He is complaining of shoulder pain for quite some time however this is more likely to be rotator cuff particular given the mechanism of action.  Using sterile technique, I injected the right subacromial space with 2 cc of lidocaine, 2 cc of Marcaine, and 2 cc of 40 mg/mL Kenalog.  He tolerated the procedure well without complication.  I will repeat his A1c which was 6.4 in November.  He does not want to take the metformin twice a day therefore I will transition him to metformin extended release 1000 mg p.o. every morning to provide 24-hour coverage  06/21/23 Patient saw very little benefit from the cortisone injection that I gave him in his right shoulder.  He has been dealing with this shoulder pain for years.  We obtained x-rays in 2021 that showed no significant joint pathology.  However now he is reporting pain in both shoulders left greater than right with abduction greater then 90 degrees.  He states that he can barely lift his arms to put his shirt on.  Here today he has noticeable weakness with empty can testing.  Abduction is limited to 3/5 in the right shoulder.  Left shoulder his strength is better.  Abduction of the left shoulder is 4 out of 5.  However empty can testing elicits pain in both  shoulders.  Hawking's maneuver elicits pain in both shoulders.  He has normal reflexes checked at the biceps and brachioradialis.  He has normal grip strength.  Bicep strength is normal.  He has negative Spurling sign.  He has no numbness or tingling in either arm. Past Medical History:  Diagnosis Date   BPH with elevated PSA    Diabetes mellitus type 2 in nonobese [facility])    Diverticulosis    Erectile dysfunction    Esophageal stricture    GERD (gastroesophageal reflux disease)    Hiatal hernia    HLD (hyperlipidemia)    Past Surgical History:  Procedure Laterality Date   PROSTATE SURGERY     Current Outpatient Medications on File Prior to Visit  Medication Sig Dispense Refill   finasteride (PROSCAR) 5 MG tablet Take 5 mg by mouth daily.     metFORMIN (GLUCOPHAGE-XR) 500 MG 24 hr tablet Take 2 tablets (1,000 mg total) by mouth daily with breakfast. 180 tablet 3   naproxen sodium (ALEVE) 220 MG tablet Take 220 mg by mouth.     pantoprazole (PROTONIX) 40 MG tablet Take 1 tablet (40 mg total) by mouth daily. 90 tablet 1   pravastatin (PRAVACHOL) 20 MG tablet Take 1 tablet (20 mg total) by mouth daily. 90 tablet 3   senna-docusate (SENOKOT-S) 8.6-50 MG tablet Take 2  tablets by mouth daily. 60 tablet 3   tamsulosin (FLOMAX) 0.4 MG CAPS capsule Take 0.4 mg by mouth daily.     terbinafine (LAMISIL) 250 MG tablet Take 1 tablet (250 mg total) by mouth daily. 30 tablet 1   diclofenac (VOLTAREN) 75 MG EC tablet Take 1 tablet (75 mg total) by mouth 2 (two) times daily. (Patient not taking: Reported on 06/21/2023) 60 tablet 2   No current facility-administered medications on file prior to visit.    Allergies  Allergen Reactions   Penicillins    Social History   Socioeconomic History   Marital status: Single    Spouse name: Not on file   Number of children: Not on file   Years of education: Not on file   Highest education level: Not on file  Occupational History   Not on file  Tobacco  Use   Smoking status: Never   Smokeless tobacco: Never  Substance and Sexual Activity   Alcohol use: No   Drug use: No   Sexual activity: Yes    Comment: married, bus driver  Other Topics Concern   Not on file  Social History Narrative   Not on file   Social Determinants of Health   Financial Resource Strain: Low Risk  (05/01/2021)   Overall Financial Resource Strain (CARDIA)    Difficulty of Paying Living Expenses: Not hard at all  Food Insecurity: No Food Insecurity (05/01/2021)   Hunger Vital Sign    Worried About Running Out of Food in the Last Year: Never true    Ran Out of Food in the Last Year: Never true  Transportation Needs: No Transportation Needs (05/01/2021)   PRAPARE - Administrator, Civil Service (Medical): No    Lack of Transportation (Non-Medical): No  Physical Activity: Sufficiently Active (05/01/2021)   Exercise Vital Sign    Days of Exercise per Week: 5 days    Minutes of Exercise per Session: 40 min  Stress: No Stress Concern Present (05/01/2021)   [employer] of Occupational Health - Occupational Stress Questionnaire    Feeling of Stress : Not at all  Social Connections: Moderately Integrated (05/01/2021)   Social Connection and Isolation Panel [NHANES]    Frequency of Communication with Friends and Family: More than three times a week    Frequency of Social Gatherings with Friends and Family: Three times a week    Attends Religious Services: More than 4 times per year    Active Member of Clubs or Organizations: No    Attends Banker Meetings: Never    Marital Status: Married  Catering manager Violence: Not At Risk (05/01/2021)   Humiliation, Afraid, Rape, and Kick questionnaire    Fear of Current or Ex-Partner: No    Emotionally Abused: No    Physically Abused: No    Sexually Abused: No   Family History  Problem Relation Age of Onset   Heart disease Brother    Stroke Brother      Review of Systems  Musculoskeletal:   Positive for back pain.  All other systems reviewed and are negative.      Objective:   Physical Exam Vitals reviewed.  Constitutional:      General: He is not in acute distress.    Appearance: He is well-developed. He is not diaphoretic.  HENT:     Head: Normocephalic and atraumatic.  Neck:     Thyroid: No thyromegaly.  Cardiovascular:     Rate and Rhythm:  Normal rate and regular rhythm.     Heart sounds: Normal heart sounds. No murmur heard.    No friction rub. No gallop.  Pulmonary:     Effort: Pulmonary effort is normal. No respiratory distress.     Breath sounds: Normal breath sounds. No wheezing or rales.  Chest:     Chest wall: No tenderness.  Abdominal:     General: Bowel sounds are normal.     Palpations: Abdomen is soft.  Musculoskeletal:     Right shoulder: Tenderness and crepitus present. No effusion. Decreased range of motion. Decreased strength.     Left shoulder: Tenderness and crepitus present. Decreased range of motion. Decreased strength.     Cervical back: Normal range of motion and neck supple. Tenderness present. No bony tenderness.       Back:     Right knee: No swelling or effusion. Normal range of motion. No medial joint line or lateral joint line tenderness.  Skin:    General: Skin is warm.     Coloration: Skin is not pale.     Findings: No erythema or rash.  Neurological:     Motor: No abnormal muscle tone.     Deep Tendon Reflexes: Reflexes are normal and symmetric.          Assessment & Plan:  Chronic right shoulder pain - Plan: MR Shoulder Left Wo Contrast, MR Shoulder Right Wo Contrast  Chronic left shoulder pain - Plan: MR Shoulder Left Wo Contrast, MR Shoulder Right Wo Contrast  Rotator cuff arthropathy of both shoulders - Plan: MR Shoulder Left Wo Contrast, MR Shoulder Right Wo Contrast Patient has been dealing with pain in both shoulders now for more than 3 years.  We have tried cortisone injections.  We have tried NSAIDs  including meloxicam and diclofenac without benefit.  Patient is currently taking Aleve without benefit.  At this point, I believe we need to proceed with an MRI of the shoulder to determine the extent of the damage to the rotator cuff.  X-rays were unremarkable.

## 2023-06-23 DIAGNOSIS — R350 Frequency of micturition: Secondary | ICD-10-CM | POA: Diagnosis not present

## 2023-06-23 DIAGNOSIS — R972 Elevated prostate specific antigen [PSA]: Secondary | ICD-10-CM | POA: Diagnosis not present

## 2023-06-23 DIAGNOSIS — N401 Enlarged prostate with lower urinary tract symptoms: Secondary | ICD-10-CM | POA: Diagnosis not present

## 2023-06-30 ENCOUNTER — Telehealth: Payer: Self-pay

## 2023-06-30 NOTE — Telephone Encounter (Signed)
Fax received from [employer] that additonal inormation is needed to approve pt's MRI. Chart notes faxed to [phone].

## 2023-07-01 ENCOUNTER — Other Ambulatory Visit: Payer: Medicare HMO

## 2023-07-06 ENCOUNTER — Other Ambulatory Visit: Payer: Self-pay | Admitting: Family Medicine

## 2023-07-06 ENCOUNTER — Encounter: Payer: Self-pay | Admitting: Family Medicine

## 2023-07-06 ENCOUNTER — Ambulatory Visit (INDEPENDENT_AMBULATORY_CARE_PROVIDER_SITE_OTHER): Payer: Medicare HMO | Admitting: Family Medicine

## 2023-07-06 VITALS — BP 130/70 | HR 71 | Temp 98.1°F | Ht 68.0 in | Wt 138.4 lb

## 2023-07-06 DIAGNOSIS — E119 Type 2 diabetes mellitus without complications: Secondary | ICD-10-CM

## 2023-07-06 DIAGNOSIS — M25511 Pain in right shoulder: Secondary | ICD-10-CM

## 2023-07-06 DIAGNOSIS — M25512 Pain in left shoulder: Secondary | ICD-10-CM

## 2023-07-06 DIAGNOSIS — G8929 Other chronic pain: Secondary | ICD-10-CM

## 2023-07-06 MED ORDER — LANCETS MISC. MISC: 1.0000 | Freq: Three times a day (TID) | 0 refills | Status: AC

## 2023-07-06 MED ORDER — TRIAMCINOLONE ACETONIDE 40 MG/ML IJ SUSP
80.0000 mg | Freq: Once | INTRAMUSCULAR | Status: AC
Start: 2023-07-06 — End: 2023-07-06
  Administered 2023-07-06: 80 mg via INTRA_ARTICULAR

## 2023-07-06 MED ORDER — LIDOCAINE HCL (PF) 1 % IJ SOLN
2.0000 mL | Freq: Once | INTRAMUSCULAR | Status: AC
Start: 2023-07-06 — End: 2023-07-06
  Administered 2023-07-06: 2 mL

## 2023-07-06 MED ORDER — BUPIVACAINE HCL 0.25 % IJ SOLN
2.0000 mL | Freq: Once | INTRAMUSCULAR | Status: AC
Start: 2023-07-06 — End: 2023-07-06
  Administered 2023-07-06: 2 mL

## 2023-07-06 MED ORDER — BLOOD GLUCOSE TEST VI STRP: 1.0000 | ORAL_STRIP | Freq: Three times a day (TID) | 0 refills | Status: AC

## 2023-07-06 MED ORDER — LANCET DEVICE MISC: 1.0000 | Freq: Three times a day (TID) | 0 refills | Status: AC

## 2023-07-06 MED ORDER — BLOOD GLUCOSE MONITORING SUPPL DEVI: 1.0000 | Freq: Three times a day (TID) | 0 refills | Status: AC

## 2023-07-06 NOTE — Patient Instructions (Signed)
All lines when allergist

## 2023-07-06 NOTE — Addendum Note (Signed)
Addended by: Venia Carbon K on: 07/06/2023 04:19 PM   Modules accepted: Orders

## 2023-07-06 NOTE — Progress Notes (Signed)
Subjective:    Patient ID: Bryan Mccullough, male    DOB: February 27, 1941, 82 y.o.   MRN: [id number] 02/18/23 Patient continues to endorse pain in both shoulders.  He is not taking diclofenac regularly.  He states that he forgets to take it.  His range of motion in his left shoulder is quite good however he has pain with abduction greater than 100 degrees in his right shoulder.  He also complains of pain with external rotation.  He states that it hurts more at night.  He states that has been bothering him more after he fell.  He was cutting a branch with a saw.  He fell and he jerked his arm backward and external rotation as he fell.  Since that time he has had more persistent pain.  At that time, my plan was: He is complaining of shoulder pain for quite some time however this is more likely to be rotator cuff particular given the mechanism of action.  Using sterile technique, I injected the right subacromial space with 2 cc of lidocaine, 2 cc of Marcaine, and 2 cc of 40 mg/mL Kenalog.  He tolerated the procedure well without complication.  I will repeat his A1c which was 6.4 in November.  He does not want to take the metformin twice a day therefore I will transition him to metformin extended release 1000 mg p.o. every morning to provide 24-hour coverage  06/21/23 Patient saw very little benefit from the cortisone injection that I gave him in his right shoulder.  He has been dealing with this shoulder pain for years.  We obtained x-rays in 2021 that showed no significant joint pathology.  However now he is reporting pain in both shoulders left greater than right with abduction greater then 90 degrees.  He states that he can barely lift his arms to put his shirt on.  Here today he has noticeable weakness with empty can testing.  Abduction is limited to 3/5 in the right shoulder.  Left shoulder his strength is better.  Abduction of the left shoulder is 4 out of 5.  However empty can testing elicits pain in both  shoulders.  Hawking's maneuver elicits pain in both shoulders.  He has normal reflexes checked at the biceps and brachioradialis.  He has normal grip strength.  Bicep strength is normal.  He has negative Spurling sign.  He has no numbness or tingling in either arm.  At that time, my plan was: Patient has been dealing with pain in both shoulders now for more than 3 years.  We have tried cortisone injections.  We have tried NSAIDs including meloxicam and diclofenac without benefit.  Patient is currently taking Aleve without benefit.  At this point, I believe we need to proceed with an MRI of the shoulder to determine the extent of the damage to the rotator cuff.  X-rays were unremarkable.  07/06/23 Insurance has refused the MRI of his shoulder.  He is here today requesting cortisone injections in both shoulders.  I explained to the patient that I was confused.  Previously he saw no benefit from the cortisone injection.  He states that it may have given him temporary relief and at the present time the pain in his shoulders is keeping him from sleeping despite taking diclofenac and using Voltaren gel and menthol gel.  I have recommended seeing orthopedics as I believe that he likely has a chronic tear in his rotator cuff.  Patient is comfortable seeing orthopedics but would like  to get cortisone shots today for relief.  He is overdue to monitor his diabetic parameters including his hemoglobin A1c. Past Medical History:  Diagnosis Date   BPH with elevated PSA    Diabetes mellitus type 2 in nonobese [facility])    Diverticulosis    Erectile dysfunction    Esophageal stricture    GERD (gastroesophageal reflux disease)    Hiatal hernia    HLD (hyperlipidemia)    Past Surgical History:  Procedure Laterality Date   PROSTATE SURGERY     Current Outpatient Medications on File Prior to Visit  Medication Sig Dispense Refill   diclofenac (VOLTAREN) 75 MG EC tablet Take 1 tablet (75 mg total) by mouth 2 (two) times  daily. 60 tablet 2   finasteride (PROSCAR) 5 MG tablet Take 5 mg by mouth daily.     metFORMIN (GLUCOPHAGE-XR) 500 MG 24 hr tablet Take 2 tablets (1,000 mg total) by mouth daily with breakfast. 180 tablet 3   naproxen sodium (ALEVE) 220 MG tablet Take 220 mg by mouth.     pantoprazole (PROTONIX) 40 MG tablet Take 1 tablet (40 mg total) by mouth daily. 90 tablet 1   pravastatin (PRAVACHOL) 20 MG tablet Take 1 tablet (20 mg total) by mouth daily. 90 tablet 3   senna-docusate (SENOKOT-S) 8.6-50 MG tablet Take 2 tablets by mouth daily. 60 tablet 3   tamsulosin (FLOMAX) 0.4 MG CAPS capsule Take 0.4 mg by mouth daily.     terbinafine (LAMISIL) 250 MG tablet Take 1 tablet (250 mg total) by mouth daily. 30 tablet 1   No current facility-administered medications on file prior to visit.    Allergies  Allergen Reactions   Penicillins    Social History   Socioeconomic History   Marital status: Single    Spouse name: Not on file   Number of children: Not on file   Years of education: Not on file   Highest education level: Not on file  Occupational History   Not on file  Tobacco Use   Smoking status: Never   Smokeless tobacco: Never  Substance and Sexual Activity   Alcohol use: No   Drug use: No   Sexual activity: Yes    Comment: married, bus driver  Other Topics Concern   Not on file  Social History Narrative   Not on file   Social Determinants of Health   Financial Resource Strain: Low Risk  (05/01/2021)   Overall Financial Resource Strain (CARDIA)    Difficulty of Paying Living Expenses: Not hard at all  Food Insecurity: No Food Insecurity (05/01/2021)   Hunger Vital Sign    Worried About Running Out of Food in the Last Year: Never true    Ran Out of Food in the Last Year: Never true  Transportation Needs: No Transportation Needs (05/01/2021)   PRAPARE - Administrator, Civil Service (Medical): No    Lack of Transportation (Non-Medical): No  Physical Activity:  Sufficiently Active (05/01/2021)   Exercise Vital Sign    Days of Exercise per Week: 5 days    Minutes of Exercise per Session: 40 min  Stress: No Stress Concern Present (05/01/2021)   [employer] of Occupational Health - Occupational Stress Questionnaire    Feeling of Stress : Not at all  Social Connections: Moderately Integrated (05/01/2021)   Social Connection and Isolation Panel [NHANES]    Frequency of Communication with Friends and Family: More than three times a week    Frequency of  Social Gatherings with Friends and Family: Three times a week    Attends Religious Services: More than 4 times per year    Active Member of Clubs or Organizations: No    Attends Banker Meetings: Never    Marital Status: Married  Catering manager Violence: Not At Risk (05/01/2021)   Humiliation, Afraid, Rape, and Kick questionnaire    Fear of Current or Ex-Partner: No    Emotionally Abused: No    Physically Abused: No    Sexually Abused: No   Family History  Problem Relation Age of Onset   Heart disease Brother    Stroke Brother      Review of Systems  Musculoskeletal:  Positive for back pain.  All other systems reviewed and are negative.      Objective:   Physical Exam Vitals reviewed.  Constitutional:      General: He is not in acute distress.    Appearance: He is well-developed. He is not diaphoretic.  HENT:     Head: Normocephalic and atraumatic.  Neck:     Thyroid: No thyromegaly.  Cardiovascular:     Rate and Rhythm: Normal rate and regular rhythm.     Heart sounds: Normal heart sounds. No murmur heard.    No friction rub. No gallop.  Pulmonary:     Effort: Pulmonary effort is normal. No respiratory distress.     Breath sounds: Normal breath sounds. No wheezing or rales.  Chest:     Chest wall: No tenderness.  Abdominal:     General: Bowel sounds are normal.     Palpations: Abdomen is soft.  Musculoskeletal:     Right shoulder: Tenderness and crepitus  present. No effusion. Decreased range of motion. Decreased strength.     Left shoulder: Tenderness and crepitus present. Decreased range of motion. Decreased strength.     Cervical back: Normal range of motion and neck supple. Tenderness present. No bony tenderness.       Back:     Right knee: No swelling or effusion. Normal range of motion. No medial joint line or lateral joint line tenderness.  Skin:    General: Skin is warm.     Coloration: Skin is not pale.     Findings: No erythema or rash.  Neurological:     Motor: No abnormal muscle tone.     Deep Tendon Reflexes: Reflexes are normal and symmetric.          Assessment & Plan:  Diabetes mellitus type 2 in nonobese (HCC) - Plan: Hemoglobin A1c, COMPLETE METABOLIC PANEL WITH GFR, Lipid panel  Chronic right shoulder pain  Chronic left shoulder pain While the patient is here on Monday check hemoglobin A1c CMP and a lipid panel.  Goal hemoglobin A1c is less than 6.5 and goal LDL cholesterol is less than 100.  Patient has had chronic bilateral shoulder pain and has tried and failed numerous conservative options.  At this point I have recommended orthopedics to obtain MRI to determine if he needs either shoulder replacement or surgery to repair a tear in his rotator cuff.  Insurance has refused the MRI to help me determine the cause.  Hopefully orthopedics can get this approved.  Using sterile technique, I injected the right shoulder with 2 cc of lidocaine, 2 cc of Marcaine, and 2 cc of 40 mg/mL Kenalog.  I then repeated the injection in the left shoulder using sterile technique.

## 2023-07-09 ENCOUNTER — Telehealth: Payer: Self-pay

## 2023-07-09 NOTE — Telephone Encounter (Signed)
disregard

## 2023-07-10 ENCOUNTER — Other Ambulatory Visit: Payer: Medicare HMO

## 2023-07-22 ENCOUNTER — Ambulatory Visit: Payer: Medicare HMO | Admitting: Orthopedic Surgery

## 2023-08-05 ENCOUNTER — Other Ambulatory Visit (INDEPENDENT_AMBULATORY_CARE_PROVIDER_SITE_OTHER): Payer: Medicare HMO

## 2023-08-05 ENCOUNTER — Ambulatory Visit (INDEPENDENT_AMBULATORY_CARE_PROVIDER_SITE_OTHER): Payer: Medicare HMO | Admitting: Orthopedic Surgery

## 2023-08-05 DIAGNOSIS — M25511 Pain in right shoulder: Secondary | ICD-10-CM | POA: Diagnosis not present

## 2023-08-05 DIAGNOSIS — M25512 Pain in left shoulder: Secondary | ICD-10-CM

## 2023-08-07 ENCOUNTER — Encounter: Payer: Self-pay | Admitting: Orthopedic Surgery

## 2023-08-07 NOTE — Progress Notes (Signed)
Office Visit Note   Patient: Bryan Mccullough           Date of Birth: 1941-05-21           MRN: [id number] Visit Date: 08/05/2023 Requested by: Donita Brooks, MD [address] [place],  [place] [address] PCP: Donita Brooks, MD  Subjective: Chief Complaint  Patient presents with   Right Shoulder - Pain   Left Shoulder - Pain    HPI: Bryan Mccullough is a 82 y.o. male who presents to the office reporting bilateral shoulder pain.  Patient fell 15 months ago and had an abduction external rotation injury to his right shoulder.  He has had an injection in the subacromial space of the right shoulder which helped him.  He is.Very equivocal about having any type of intervention done but he does report pain with activity in that shoulder.                ROS: All systems reviewed are negative as they relate to the chief complaint within the history of present illness.  Patient denies fevers or chills.  Assessment & Plan: Visit Diagnoses:  1. Acute pain of both shoulders     Plan: Impression is right shoulder pain with subluxation of the biceps tendon out of the bicipital groove and tearing of the upper portion of the subscap.  Would like to get an MRI arthrogram of the right shoulder to evaluate more fully subscap tear and the status of the remaining rotator cuff.  I think it is possible even though he does not want a large procedure that the subluxated biceps tendon could be giving him enough symptoms that if released it would improve his pain.  Alternatively we could consider an intra-articular injection in the shoulder to see how much that would help as well.  Follow-up after that study.  Follow-Up Instructions: No follow-ups on file.   Orders:  Orders Placed This Encounter  Procedures   XR Shoulder Left   XR Shoulder Right   MR SHOULDER RIGHT W CONTRAST   Arthrogram   No orders of the defined types were placed in this encounter.     Procedures: No procedures  performed   Clinical Data: No additional findings.  Objective: Vital Signs: There were no vitals taken for this visit.  Physical Exam:  Constitutional: Patient appears well-developed HEENT:  Head: Normocephalic Eyes:EOM are normal Neck: Normal range of motion Cardiovascular: Normal rate Pulmonary/chest: Effort normal Neurologic: Patient is alert Skin: Skin is warm Psychiatric: Patient has normal mood and affect  Ortho Exam: Ortho exam demonstrates range of motion on the right of 70/100/165.  Has a little bit of popping and coarseness with internal/external rotation of the right arm.  No Popeye deformity.  Has 5 out of 5 external rotation strength 5- out of 5 subscap strength on the right.  No AC joint tenderness.  Neck range of motion intact.  Specialty Comments:  No specialty comments available.  Imaging: No results found.   PMFS History: Patient Active Problem List   Diagnosis Date Noted   Diabetes mellitus type 2 in nonobese (HCC)    HLD (hyperlipidemia)    BPH with elevated PSA    Esophageal stricture    Erectile dysfunction    Lumbosacral root lesions, not elsewhere classified 03/03/2013   Past Medical History:  Diagnosis Date   BPH with elevated PSA    Diabetes mellitus type 2 in nonobese [facility])    Diverticulosis  Erectile dysfunction    Esophageal stricture    GERD (gastroesophageal reflux disease)    Hiatal hernia    HLD (hyperlipidemia)     Family History  Problem Relation Age of Onset   Heart disease Brother    Stroke Brother     Past Surgical History:  Procedure Laterality Date   PROSTATE SURGERY     Social History   Occupational History   Not on file  Tobacco Use   Smoking status: Never   Smokeless tobacco: Never  Substance and Sexual Activity   Alcohol use: No   Drug use: No   Sexual activity: Yes    Comment: married, bus driver

## 2023-08-23 ENCOUNTER — Encounter (HOSPITAL_COMMUNITY): Payer: Self-pay

## 2023-08-23 ENCOUNTER — Emergency Department (HOSPITAL_COMMUNITY): Payer: Medicare HMO

## 2023-08-23 ENCOUNTER — Other Ambulatory Visit: Payer: Self-pay

## 2023-08-23 ENCOUNTER — Emergency Department (HOSPITAL_COMMUNITY)
Admission: EM | Admit: 2023-08-23 | Discharge: 2023-08-23 | Disposition: A | Discharge status: Home / Self Care | Payer: Medicare HMO | Attending: Emergency Medicine | Admitting: Emergency Medicine

## 2023-08-23 DIAGNOSIS — W1839XA Other fall on same level, initial encounter: Secondary | ICD-10-CM | POA: Insufficient documentation

## 2023-08-23 DIAGNOSIS — Y9301 Activity, walking, marching and hiking: Secondary | ICD-10-CM | POA: Insufficient documentation

## 2023-08-23 DIAGNOSIS — S42031A Displaced fracture of lateral end of right clavicle, initial encounter for closed fracture: Secondary | ICD-10-CM

## 2023-08-23 DIAGNOSIS — M19011 Primary osteoarthritis, right shoulder: Secondary | ICD-10-CM | POA: Diagnosis not present

## 2023-08-23 DIAGNOSIS — M79645 Pain in left finger(s): Secondary | ICD-10-CM | POA: Insufficient documentation

## 2023-08-23 DIAGNOSIS — M79642 Pain in left hand: Secondary | ICD-10-CM | POA: Insufficient documentation

## 2023-08-23 DIAGNOSIS — S6992XA Unspecified injury of left wrist, hand and finger(s), initial encounter: Secondary | ICD-10-CM | POA: Diagnosis not present

## 2023-08-23 DIAGNOSIS — S62525A Nondisplaced fracture of distal phalanx of left thumb, initial encounter for closed fracture: Secondary | ICD-10-CM | POA: Diagnosis not present

## 2023-08-23 DIAGNOSIS — M25511 Pain in right shoulder: Secondary | ICD-10-CM | POA: Insufficient documentation

## 2023-08-23 DIAGNOSIS — W19XXXA Unspecified fall, initial encounter: Secondary | ICD-10-CM

## 2023-08-23 DIAGNOSIS — S62522A Displaced fracture of distal phalanx of left thumb, initial encounter for closed fracture: Secondary | ICD-10-CM | POA: Diagnosis not present

## 2023-08-23 DIAGNOSIS — M1811 Unilateral primary osteoarthritis of first carpometacarpal joint, right hand: Secondary | ICD-10-CM | POA: Diagnosis not present

## 2023-08-23 DIAGNOSIS — M79641 Pain in right hand: Secondary | ICD-10-CM | POA: Diagnosis not present

## 2023-08-23 MED ORDER — BACITRACIN ZINC 500 UNIT/GM EX OINT
TOPICAL_OINTMENT | Freq: Two times a day (BID) | CUTANEOUS | Status: DC
  Administered 2023-08-23: 1 via TOPICAL
  Filled 2023-08-23: qty 0.9

## 2023-08-23 NOTE — ED Triage Notes (Signed)
Pt came in via POV d/t falling last weekend when he was walking his dog & fell forward onto pavement. Reports 8/10 pain in his Rt shoulder & both hands, denies hitting his head, A/Ox4.

## 2023-08-23 NOTE — Discharge Instructions (Addendum)
You can continue taking your aleve.  Apply ice.  Wear the splint and sling.    Follow-up with the orthopedic doctor listed.

## 2023-08-23 NOTE — ED Provider Notes (Signed)
[facility] Emergency Department Provider Note MRN:  [id number]  Arrival date & time: 08/23/23     Chief Complaint   Fall, Shoulder Pain, and Bil Hand Pain   History of Present Illness   Bryan Mccullough is a 82 y.o. year-old male presents to the ED with chief complaint of right shoulder pain and bilateral hand pain.  States that he fell while walking his dog 2 night ago.  He reports having worsening swelling and bruising of his left thumb and has worsening pain of the right shoulder.  He denies head injury.  Denies LOC.  States that he has been taking aleve with good relief.  He states that his pain is 2/10.  History provided by patient.   Review of Systems  Pertinent positive and negative review of systems noted in HPI.    Physical Exam   Vitals:   08/23/23 1551 08/23/23 2017  BP: (!) 141/76 (!) 140/127  Pulse: 72 63  Resp: 17 16  Temp: 98 F (36.7 C) 97.9 F (36.6 C)  SpO2: 99% 100%    CONSTITUTIONAL:  well-appearing, NAD NEURO:  Alert and oriented x 3, CN 3-12 grossly intact EYES:  eyes equal and reactive ENT/NECK:  Supple, no stridor  CARDIO:  normal rate, regular rhythm, appears well-perfused  PULM:  No respiratory distress GI/GU:  non-distended,  MSK/SPINE:  No gross deformities, no edema, moves all extremities, bruising and swelling of the left thumb, TTP over the right clavicle SKIN:  no rash, atraumatic   *Additional and/or pertinent findings included in MDM below  Diagnostic and Interventional Summary    EKG Interpretation Date/Time:    Ventricular Rate:    PR Interval:    QRS Duration:    QT Interval:    QTC Calculation:   R Axis:      Text Interpretation:         Labs Reviewed - No data to display  DG Hand Complete Left  Final Result    DG Hand Complete Right  Final Result    DG Shoulder Right  Final Result      Medications  bacitracin ointment (has no administration in time range)     Procedures  /   Critical Care Procedures  ED Course and Medical Decision Making  I have reviewed the triage vital signs, the nursing notes, and pertinent available records from the EMR.  Social Determinants Affecting Complexity of Care: Patient has no clinically significant social determinants affecting this chief complaint..   ED Course: Clinical Course as of 08/23/23 2236  [facility] Aug 23, 2023  2235 DG Shoulder Right +clavicle fracture, treated with sling immobilizer [RB]  2235 DG Hand Complete Left Left thumb, distal phalanx fx, treated with thumb spica [RB]    Clinical Course User Index [RB] Roxy Horseman, PA-C    Medical Decision Making Amount and/or Complexity of Data Reviewed Radiology: ordered and independent interpretation performed. Decision-making details documented in ED Course.  Risk OTC drugs.         Consultants: No consultations were needed in caring for this patient.   Treatment and Plan: Emergency department workup does not suggest an emergent condition requiring admission or immediate intervention beyond  what has been performed at this time. The patient is safe for discharge and has  been instructed to return immediately for worsening symptoms, change in  symptoms or any other concerns    Final Clinical Impressions(s) / ED Diagnoses     ICD-10-CM   1. Fall,  initial encounter  W19.XXXA     2. Closed displaced fracture of acromial end of right clavicle, initial encounter  S42.031A     3. Displaced fracture of distal phalanx of left thumb, initial encounter for closed fracture  U98.119J       ED Discharge Orders     None         Discharge Instructions Discussed with and Provided to Patient:     Discharge Instructions      You can continue taking your aleve.  Apply ice.  Wear the splint and sling.    Follow-up with the orthopedic doctor listed.       Roxy Horseman, PA-C 08/23/23 2236    Derwood Kaplan, MD 08/24/23 Jerene Bears

## 2023-08-23 NOTE — Progress Notes (Signed)
Orthopedic Tech Progress Note Patient Details:  Bryan Mccullough [birth year] [id number]  Ortho Devices Type of Ortho Device: Thumb spica splint, Arm sling Splint Material: Fiberglass Ortho Device/Splint Location: lue thumb spica, rue arm sling Ortho Device/Splint Interventions: Ordered, Application, Adjustment  I applied the splint to the arm, while I was applying the splint the patient kept moving their finger and thumb. I explained that they needed to relax their hand and not move their thumb. I tried to apply the sling but the patient wouldn't let me. They allowed me to apply the sling before leaving the room. But they continued to move and use the arm by moving the arm out of the front of the sling. Post Interventions Patient Tolerated: Well Instructions Provided: Adjustment of device  Trinna Post 08/23/2023, 11:27 PM

## 2023-08-27 ENCOUNTER — Ambulatory Visit: Payer: Medicare HMO | Admitting: Orthopaedic Surgery

## 2023-08-27 DIAGNOSIS — S62525A Nondisplaced fracture of distal phalanx of left thumb, initial encounter for closed fracture: Secondary | ICD-10-CM | POA: Diagnosis not present

## 2023-08-27 DIAGNOSIS — S42031A Displaced fracture of lateral end of right clavicle, initial encounter for closed fracture: Secondary | ICD-10-CM

## 2023-08-27 MED ORDER — HYDROCODONE-ACETAMINOPHEN 5-325 MG PO TABS: 1.0000 | ORAL_TABLET | Freq: Four times a day (QID) | ORAL | 0 refills | Status: DC | PRN

## 2023-08-27 NOTE — Progress Notes (Signed)
Office Visit Note   Patient: Bryan Mccullough           Date of Birth: 1940-12-09           MRN: [id number] Visit Date: 08/27/2023              Requested by: Donita Brooks, MD [address] [facility] [address] [place],  [place] [address] PCP: Donita Brooks, MD   Assessment & Plan: Visit Diagnoses:  1. Nondisplaced fracture of distal phalanx of left thumb, initial encounter for closed fracture   2. Traumatic closed fracture of distal clavicle with minimal displacement, right, initial encounter     Plan: Mr. Bryan Mccullough is a 82 year old gentleman with mildly displaced right distal clavicle fracture and nondisplaced left distal phalanx fracture of the left thumb.  We will plan to treat this nonoperatively.  Sling to the right shoulder.  Will provide him with a thumb spica brace and I will send a referral to hand therapy for thermoplastic hand-based splint.  Recheck in 3 weeks with repeat x-rays of the right clavicle and left thumb.  Follow-Up Instructions: Return in about 3 weeks (around 09/17/2023).   Orders:  Orders Placed This Encounter  Procedures   Ambulatory referral to Occupational Therapy   Meds ordered this encounter  Medications   HYDROcodone-acetaminophen (NORCO) 5-325 MG tablet    Sig: Take 1-2 tablets by mouth every 6 (six) hours as needed.    Dispense:  20 tablet    Refill:  0      Procedures: No procedures performed   Clinical Data: No additional findings.   Subjective: Chief Complaint  Patient presents with   Left Shoulder - Pain   Right Shoulder - Pain    HPI Patient is a pleasant 82 year old gentleman who comes in for ER follow-up status post mechanical fall on Monday.  He sustained a minimally displaced right distal clavicle fracture and a nondisplaced left distal phalanx fracture of the left thumb.  He had recently seen Dr. August Saucer at the end of August and MRI was ordered for suspicion of unstable biceps tendon and upper subscap tear.  The MRI is scheduled for  October.  He states that overall he is doing okay from the fracture pain. Interpreter present today. Review of Systems  Constitutional: Negative.   HENT: Negative.    Eyes: Negative.   Respiratory: Negative.    Cardiovascular: Negative.   Gastrointestinal: Negative.   Endocrine: Negative.   Genitourinary: Negative.   Skin: Negative.   Allergic/Immunologic: Negative.   Neurological: Negative.   Hematological: Negative.   Psychiatric/Behavioral: Negative.    All other systems reviewed and are negative.    Objective: Vital Signs: There were no vitals taken for this visit.  Physical Exam Vitals and nursing note reviewed.  Constitutional:      Appearance: He is well-developed.  HENT:     Head: Normocephalic and atraumatic.  Eyes:     Pupils: Pupils are equal, round, and reactive to light.  Pulmonary:     Effort: Pulmonary effort is normal.  Abdominal:     Palpations: Abdomen is soft.  Musculoskeletal:        General: Normal range of motion.     Cervical back: Neck supple.  Skin:    General: Skin is warm.  Neurological:     Mental Status: He is alert and oriented to person, place, and time.  Psychiatric:        Behavior: Behavior normal.  Thought Content: Thought content normal.        Judgment: Judgment normal.     Ortho Exam Exam of the right shoulder girdle shows expected posttraumatic swelling and bruising.  No skin tenting.  No neurovascular compromise distally.  He can painlessly elevate his arm to about the level of the shoulder.  Exam of the left thumb shows subungual hematoma.  No compromise to the nail.  No neurovascular compromise to the finger. Specialty Comments:  No specialty comments available.  Imaging: No results found.   PMFS History: Patient Active Problem List   Diagnosis Date Noted   Diabetes mellitus type 2 in nonobese (HCC)    HLD (hyperlipidemia)    BPH with elevated PSA    Esophageal stricture    Erectile dysfunction     Lumbosacral root lesions, not elsewhere classified 03/03/2013   Past Medical History:  Diagnosis Date   BPH with elevated PSA    Diabetes mellitus type 2 in nonobese (HCC)    Diverticulosis    Erectile dysfunction    Esophageal stricture    GERD (gastroesophageal reflux disease)    Hiatal hernia    HLD (hyperlipidemia)     Family History  Problem Relation Age of Onset   Heart disease Brother    Stroke Brother     Past Surgical History:  Procedure Laterality Date   PROSTATE SURGERY     Social History   Occupational History   Not on file  Tobacco Use   Smoking status: Never   Smokeless tobacco: Never  Substance and Sexual Activity   Alcohol use: No   Drug use: No   Sexual activity: Yes    Comment: married, bus driver

## 2023-08-30 ENCOUNTER — Ambulatory Visit: Payer: Medicare HMO | Admitting: Family Medicine

## 2023-08-30 VITALS — BP 130/62 | HR 96 | Temp 98.0°F | Ht 68.0 in | Wt 134.4 lb

## 2023-08-30 DIAGNOSIS — J358 Other chronic diseases of tonsils and adenoids: Secondary | ICD-10-CM

## 2023-08-30 MED ORDER — NYSTATIN 100000 UNIT/ML MT SUSP: 5.0000 mL | Freq: Four times a day (QID) | OROMUCOSAL | 0 refills | Status: DC

## 2023-08-30 NOTE — Progress Notes (Signed)
Subjective:    Patient ID: Bryan Mccullough, male    DOB: [birth year], 82 y.o.   MRN: [id number] Patient is currently seeing Dr. August Saucer for right shoulder pain.  This is a chronic problem and presumed to be due to a tear in his rotator cuff tendon.  However he recently suffered a fall on September 16.  He was walking his dog.  The dog jerked him to the ground by accident.  He sustained a fracture of his left thumb as well as a fracture of his right clavicle.  He is currently seeing Dr. Roda Shutters in regards to this.  They have recommended wearing a sling for comfort and allowing the clavicle to heal conservatively.  He is also in a thumb spica splint for the left thumb fracture.  He is scheduled to see Dr. Roda Shutters in 3 weeks to follow this up.  He is scheduled to get an MRI of his right shoulder on the 10th per his report to evaluate for a tear in the rotator cuff.  Today he is endorsing a white debris that forms on his uvula and his tongue every morning.  He has to swish his mouth for Listerine and then will disappear.  It causes some burning on his tongue.  Today on exam there is no erythema in the posterior oropharynx.  I do not appreciate any white exudate. Past Medical History:  Diagnosis Date   BPH with elevated PSA    Diabetes mellitus type 2 in nonobese [facility])    Diverticulosis    Erectile dysfunction    Esophageal stricture    GERD (gastroesophageal reflux disease)    Hiatal hernia    HLD (hyperlipidemia)    Past Surgical History:  Procedure Laterality Date   PROSTATE SURGERY     Current Outpatient Medications on File Prior to Visit  Medication Sig Dispense Refill   Blood Glucose Monitoring Suppl DEVI 1 each by Does not apply route in the morning, at noon, and at bedtime. May substitute to any manufacturer covered by patient's insurance. 1 each 0   diclofenac (VOLTAREN) 75 MG EC tablet Take 1 tablet (75 mg total) by mouth 2 (two) times daily. 60 tablet 2   finasteride (PROSCAR) 5 MG tablet Take 5 mg  by mouth daily.     HYDROcodone-acetaminophen (NORCO) 5-325 MG tablet Take 1-2 tablets by mouth every 6 (six) hours as needed. 20 tablet 0   metFORMIN (GLUCOPHAGE-XR) 500 MG 24 hr tablet Take 2 tablets (1,000 mg total) by mouth daily with breakfast. 180 tablet 3   naproxen sodium (ALEVE) 220 MG tablet Take 220 mg by mouth.     pantoprazole (PROTONIX) 40 MG tablet Take 1 tablet (40 mg total) by mouth daily. 90 tablet 1   pravastatin (PRAVACHOL) 20 MG tablet Take 1 tablet (20 mg total) by mouth daily. 90 tablet 3   senna-docusate (SENOKOT-S) 8.6-50 MG tablet Take 2 tablets by mouth daily. 60 tablet 3   tamsulosin (FLOMAX) 0.4 MG CAPS capsule Take 0.4 mg by mouth daily.     terbinafine (LAMISIL) 250 MG tablet Take 1 tablet (250 mg total) by mouth daily. 30 tablet 1   No current facility-administered medications on file prior to visit.    Allergies  Allergen Reactions   Penicillins    Social History   Socioeconomic History   Marital status: Single    Spouse name: Not on file   Number of children: Not on file   Years of education: Not on file  Highest education level: Not on file  Occupational History   Not on file  Tobacco Use   Smoking status: Never   Smokeless tobacco: Never  Substance and Sexual Activity   Alcohol use: No   Drug use: No   Sexual activity: Yes    Comment: married, bus driver  Other Topics Concern   Not on file  Social History Narrative   Not on file   Social Determinants of Health   Financial Resource Strain: Low Risk  (05/01/2021)   Overall Financial Resource Strain (CARDIA)    Difficulty of Paying Living Expenses: Not hard at all  Food Insecurity: No Food Insecurity (05/01/2021)   Hunger Vital Sign    Worried About Running Out of Food in the Last Year: Never true    Ran Out of Food in the Last Year: Never true  Transportation Needs: No Transportation Needs (05/01/2021)   PRAPARE - Administrator, Civil Service (Medical): No    Lack of  Transportation (Non-Medical): No  Physical Activity: Sufficiently Active (05/01/2021)   Exercise Vital Sign    Days of Exercise per Week: 5 days    Minutes of Exercise per Session: 40 min  Stress: No Stress Concern Present (05/01/2021)   [employer] of Occupational Health - Occupational Stress Questionnaire    Feeling of Stress : Not at all  Social Connections: Moderately Integrated (05/01/2021)   Social Connection and Isolation Panel [NHANES]    Frequency of Communication with Friends and Family: More than three times a week    Frequency of Social Gatherings with Friends and Family: Three times a week    Attends Religious Services: More than 4 times per year    Active Member of Clubs or Organizations: No    Attends Banker Meetings: Never    Marital Status: Married  Catering manager Violence: Not At Risk (05/01/2021)   Humiliation, Afraid, Rape, and Kick questionnaire    Fear of Current or Ex-Partner: No    Emotionally Abused: No    Physically Abused: No    Sexually Abused: No   Family History  Problem Relation Age of Onset   Heart disease Brother    Stroke Brother      Review of Systems  Musculoskeletal:  Positive for back pain.  All other systems reviewed and are negative.      Objective:   Physical Exam Vitals reviewed.  Constitutional:      General: He is not in acute distress.    Appearance: He is well-developed. He is not diaphoretic.  HENT:     Head: Normocephalic and atraumatic.     Mouth/Throat:     Mouth: Mucous membranes are moist.     Pharynx: Oropharynx is clear. No oropharyngeal exudate or posterior oropharyngeal erythema.  Neck:     Thyroid: No thyromegaly.  Cardiovascular:     Rate and Rhythm: Normal rate and regular rhythm.     Heart sounds: Normal heart sounds. No murmur heard.    No friction rub. No gallop.  Pulmonary:     Effort: Pulmonary effort is normal. No respiratory distress.     Breath sounds: Normal breath sounds. No  wheezing or rales.  Chest:     Chest wall: No tenderness.  Abdominal:     General: Bowel sounds are normal.     Palpations: Abdomen is soft.  Musculoskeletal:     Right shoulder: Deformity, tenderness, bony tenderness and crepitus present. No effusion. Decreased range of motion. Decreased  strength.     Left hand: Tenderness and bony tenderness present. Decreased range of motion.     Cervical back: Normal range of motion and neck supple.     Right knee: No swelling or effusion. Normal range of motion. No medial joint line or lateral joint line tenderness.  Skin:    General: Skin is warm.     Coloration: Skin is not pale.     Findings: No erythema or rash.  Neurological:     Motor: No abnormal muscle tone.     Deep Tendon Reflexes: Reflexes are normal and symmetric.          Assessment & Plan:  Tonsillar exudate I am unable to appreciate any abnormality in the posterior oropharynx today.  However the white debris on his tongue and uvula could be mild thrush.  Patient is at risk due to his diabetes.  I will treat the patient empirically with nystatin 1 teaspoon 4 times a day for 1 week.  If persistent, will treat the patient for dry thick mucus with biotene.

## 2023-09-03 NOTE — Therapy (Signed)
OUTPATIENT OCCUPATIONAL THERAPY ORTHO EVALUATION  Patient Name: Bryan Mccullough MRN: [id number] DOB:June 13, 1941, 82 y.o., male Today's Date: 09/03/2023  PCP: Lynnea Ferrier, MD REFERRING PROVIDER:  Tarry Kos, MD    END OF SESSION:   Past Medical History:  Diagnosis Date   BPH with elevated PSA    Diabetes mellitus type 2 in nonobese [facility])    Diverticulosis    Erectile dysfunction    Esophageal stricture    GERD (gastroesophageal reflux disease)    Hiatal hernia    HLD (hyperlipidemia)    Past Surgical History:  Procedure Laterality Date   PROSTATE SURGERY     Patient Active Problem List   Diagnosis Date Noted   Diabetes mellitus type 2 in nonobese ([facility])    HLD (hyperlipidemia)    BPH with elevated PSA    Esophageal stricture    Erectile dysfunction    Lumbosacral root lesions, not elsewhere classified 03/03/2013    ONSET DATE: 08/23/23 DOI  REFERRING DIAG: W09.811B (ICD-10-CM) - Nondisplaced fracture of distal phalanx of left thumb, initial encounter for closed fracture   THERAPY DIAG:  No diagnosis found.  Rationale for Evaluation and Treatment: Rehabilitation  SUBJECTIVE:   SUBJECTIVE STATEMENT: 2+ weeks post fx now. He arrives with interpreter, states ***.    Pt accompanied by: interpreter: ***  PERTINENT HISTORY: Hand based splint for thumb distal phalanx fracture  "82 year old gentleman with mildly displaced right distal clavicle fracture and nondisplaced left distal phalanx fracture of the left thumb. We will plan to treat this nonoperatively. Sling to the right shoulder. Will provide him with a thumb spica brace and I will send a referral to hand therapy for thermoplastic hand-based splint. Recheck in 3 weeks with repeat x-rays of the right clavicle and left thumb. "  PRECAUTIONS: {Therapy precautions:24002}  RED FLAGS: {PT Red Flags:29287}   WEIGHT BEARING RESTRICTIONS: {Yes ***/No:24003}  PAIN:  Are you having pain?  {OPRCPAIN:27236}  FALLS: Has patient fallen in last 6 months? {fallsyesno:27318}  LIVING ENVIRONMENT: Lives with: {OPRC lives with:25569::"lives with their family"} Lives in: {Lives in:25570} Stairs: {opstairs:27293} Has following equipment at home: {Assistive devices:23999}  PLOF: {PLOF:24004}  PATIENT GOALS: ***  NEXT MD VISIT: ***   OBJECTIVE: (All objective assessments below are from initial evaluation on: *** unless otherwise specified.)    HAND DOMINANCE: Right ***  ADLs: Overall ADLs: States decreased ability to grab, hold household objects, pain and difficulty to open containers, perform FMS tasks (manipulate fasteners on clothing), mild to moderate bathing problems as well. ***   FUNCTIONAL OUTCOME MEASURES: Eval: Quick DASH ***% impairment today  (Higher % Score  =  More Impairment)    Patient Specific Functional Scale: *** (***, ***, ***)  (Higher Score  =  Better Ability for the Selected Tasks)     Patient Rated Wrist Evaluation (PRWE): Pain: ***/50; Function: ***/50; Total Score: ***/100 (Higher Score  =  More Pain and/or Debility)    UPPER EXTREMITY ROM     Shoulder to Wrist AROM Right eval Left eval  Shoulder flexion    Shoulder abduction    Shoulder extension    Shoulder internal rotation    Shoulder external rotation    Elbow flexion    Elbow extension    Forearm supination    Forearm pronation     Wrist flexion    Wrist extension    Wrist ulnar deviation    Wrist radial deviation    Functional dart thrower's motion (F-DTM) in ulnar flexion  F-DTM in radial extension     (Blank rows = not tested)   Hand AROM Right eval Left eval  Full Fist Ability (or Gap to Distal Palmar Crease)    Thumb Opposition  (Kapandji Scale)     Thumb MCP (0-60)    Thumb IP (0-80)    Thumb Radial Abduction Span     Thumb Palmar Abduction Span     Index MCP (0-90)     Index PIP (0-100)     Index DIP (0-70)      Long MCP (0-90)      Long PIP (0-100)       Long DIP (0-70)      Ring MCP (0-90)      Ring PIP (0-100)      Ring DIP (0-70)      Little MCP (0-90)      Little PIP (0-100)      Little DIP (0-70)      (Blank rows = not tested)   UPPER EXTREMITY MMT:    Eval: *** NT at eval due to recent and still healing injuries. Will be tested when appropriate.   MMT Right TBD Left TBD  Shoulder flexion    Shoulder abduction    Shoulder adduction    Shoulder extension    Shoulder internal rotation    Shoulder external rotation    Middle trapezius    Lower trapezius    Elbow flexion    Elbow extension    Forearm supination    Forearm pronation    Wrist flexion    Wrist extension    Wrist ulnar deviation    Wrist radial deviation    (Blank rows = not tested)  HAND FUNCTION: Eval: Observed weakness in affected *** hand.  Grip strength Right: *** lbs, Left: *** lbs   COORDINATION: Eval: Observed coordination impairments with affected *** hand. Box and Blocks Test: *** Blocks today (*** is [facility]); 9 Hole Peg Test Right: ***sec, Left: *** sec (*** sec is WFL)   SENSATION: Eval: *** Light touch intact today, though diminished around sx area    EDEMA:   Eval: *** Mildly swollen in *** hand and wrist today, ***cm circumferentially around ***  COGNITION: Eval: Overall cognitive status: WFL for evaluation today ***  OBSERVATIONS:   Eval: ***   TODAY'S TREATMENT:  Post-evaluation treatment: ***    PATIENT EDUCATION: Education details: See tx section above for details  Person educated: Patient Education method: Engineer, structural, Teach back, Handouts  Education comprehension: States and demonstrates understanding, Additional Education required    HOME EXERCISE PROGRAM: See tx section above for details    GOALS: Goals reviewed with patient? Yes   SHORT TERM GOALS: (STG required if POC>30 days) Target Date: ***  Pt will obtain protective, custom orthotic. Goal status: TBD/PRN,  MET ***  2.  Pt will demo/state  understanding of initial HEP to improve pain levels and prerequisite motion. Goal status: INITIAL   LONG TERM GOALS: Target Date: ***  Pt will improve functional ability by decreased impairment per Quick DASH / PSFS / PRWE assessment from *** to *** or better, for better quality of life. Goal status: INITIAL  2.  Pt will improve grip strength in *** hand from ***lbs to at least ***lbs for functional use at home and in IADLs. Goal status: INITIAL  3.  Pt will improve A/ROM in *** from *** to at least ***, to have functional motion for tasks like reach and grasp.  Goal status: INITIAL  4.  Pt will improve strength in *** from *** MMT to at least *** MMT to have increased functional ability to carry out selfcare and higher-level homecare tasks with less difficulty. Goal status: INITIAL  5.  Pt will improve coordination skills in ***, as seen by better score on *** testing to have increased functional ability to carry out fine motor tasks (fasteners, etc.) and more complex, coordinated IADLs (meal prep, sports, etc.).  Goal status: INITIAL  6.  Pt will decrease pain at worst from ***/10 to ***/10 or better to have better sleep and occupational participation in daily roles. Goal status: INITIAL   ASSESSMENT:  CLINICAL IMPRESSION: Patient is a *** y.o. *** who was seen today for occupational therapy evaluation for ***.   PERFORMANCE DEFICITS: in functional skills including {OT physical skills:25468}, cognitive skills including {OT cognitive skills:25469}, and psychosocial skills including {OT psychosocial skills:25470}.   IMPAIRMENTS: are limiting patient from {OT performance deficits:25471}.   COMORBIDITIES: {Comorbidities:25485} that affects occupational performance. Patient will benefit from skilled OT to address above impairments and improve overall function.  MODIFICATION OR ASSISTANCE TO COMPLETE EVALUATION: {OT modification:25474}  OT OCCUPATIONAL PROFILE AND HISTORY: {OT  PROFILE AND HISTORY:25484}  CLINICAL DECISION MAKING: {OT CDM:25475}  REHAB POTENTIAL: {rehabpotential:25112}  EVALUATION COMPLEXITY: {Evaluation complexity:25115}      PLAN:  OT FREQUENCY: {rehab frequency:25116}  OT DURATION: {rehab duration:25117}  PLANNED INTERVENTIONS: {OT Interventions:25467}  RECOMMENDED OTHER SERVICES: ***  CONSULTED AND AGREED WITH PLAN OF CARE: {JXB:14782}  PLAN FOR NEXT SESSION: ***   Fannie Knee, OTR/L ,CHT 09/03/2023, 10:12 AM

## 2023-09-08 ENCOUNTER — Ambulatory Visit: Payer: Medicare HMO | Admitting: Rehabilitative and Restorative Service Providers"

## 2023-09-08 ENCOUNTER — Encounter: Payer: Self-pay | Admitting: Rehabilitative and Restorative Service Providers"

## 2023-09-08 ENCOUNTER — Other Ambulatory Visit: Payer: Self-pay

## 2023-09-08 DIAGNOSIS — R278 Other lack of coordination: Secondary | ICD-10-CM

## 2023-09-08 DIAGNOSIS — M6281 Muscle weakness (generalized): Secondary | ICD-10-CM | POA: Diagnosis not present

## 2023-09-08 DIAGNOSIS — M25542 Pain in joints of left hand: Secondary | ICD-10-CM

## 2023-09-08 DIAGNOSIS — R600 Localized edema: Secondary | ICD-10-CM | POA: Diagnosis not present

## 2023-09-10 ENCOUNTER — Encounter: Payer: Self-pay | Admitting: Surgical

## 2023-09-13 ENCOUNTER — Encounter: Payer: Self-pay | Admitting: Surgical

## 2023-09-14 ENCOUNTER — Encounter: Payer: Self-pay | Admitting: Surgical

## 2023-09-16 ENCOUNTER — Ambulatory Visit
Admission: RE | Admit: 2023-09-16 | Discharge: 2023-09-16 | Disposition: A | Discharge status: Home / Self Care | Payer: Medicare HMO | Source: Ambulatory Visit | Attending: Surgical | Admitting: Surgical

## 2023-09-16 DIAGNOSIS — M25511 Pain in right shoulder: Secondary | ICD-10-CM

## 2023-09-16 MED ORDER — IOPAMIDOL (ISOVUE-M 200) INJECTION 41%
10.0000 mL | Freq: Once | INTRAMUSCULAR | Status: AC
  Administered 2023-09-16: 10 mL via INTRA_ARTICULAR

## 2023-09-17 ENCOUNTER — Ambulatory Visit: Payer: Medicare HMO | Admitting: Orthopedic Surgery

## 2023-09-17 NOTE — Therapy (Signed)
OUTPATIENT OCCUPATIONAL THERAPY TREATMENT NOTE  Patient Name: Bryan Mccullough MRN: [id number] DOB:[birth year], 82 y.o., male Today's Date: 09/22/2023  PCP: Lynnea Ferrier, MD REFERRING PROVIDER:  Tarry Kos, MD    END OF SESSION:  OT End of Session - 09/22/23 1347     Visit Number 2    Number of Visits 5    Date for OT Re-Evaluation 10/22/23    Authorization Type [employer]    OT Start Time 1347    OT Stop Time 1423    OT Time Calculation (min) 36 min    Equipment Utilized During Treatment --    Activity Tolerance Patient tolerated treatment well;No increased pain;Patient limited by fatigue;Patient limited by pain    Behavior During Therapy [facility] for tasks assessed/performed              Past Medical History:  Diagnosis Date   BPH with elevated PSA    Diabetes mellitus type 2 in nonobese [facility])    Diverticulosis    Erectile dysfunction    Esophageal stricture    GERD (gastroesophageal reflux disease)    Hiatal hernia    HLD (hyperlipidemia)    Past Surgical History:  Procedure Laterality Date   PROSTATE SURGERY     Patient Active Problem List   Diagnosis Date Noted   Diabetes mellitus type 2 in nonobese (HCC)    HLD (hyperlipidemia)    BPH with elevated PSA    Esophageal stricture    Erectile dysfunction    Lumbosacral root lesions, not elsewhere classified 03/03/2013    ONSET DATE: 08/23/23 DOI  REFERRING DIAG: O75.643P (ICD-10-CM) - Nondisplaced fracture of distal phalanx of left thumb, initial encounter for closed fracture   THERAPY DIAG:  Localized edema  Muscle weakness (generalized)  Other lack of coordination  Pain in joint of left hand  Rationale for Evaluation and Treatment: Rehabilitation  PERTINENT HISTORY: Hand based splint for thumb distal phalanx fracture  "82 year old gentleman with mildly displaced right distal clavicle fracture and nondisplaced left distal phalanx fracture of the left thumb. We will plan to treat this  nonoperatively. Sling to the right shoulder. Will provide him with a thumb spica brace and I will send a referral to hand therapy for thermoplastic hand-based splint. Recheck in 3 weeks with repeat x-rays of the right clavicle and left thumb. " He arrives with interpreter, states falling and jamming Lt thumb and hitting right shoulder. He is going to have MRI for shoulder next week. He is here to work on his Lt thumb fracture tuft fracture).  He states that he will not be in town next week to attend therapy as he is going to [place].  Fortunately he has not been having much pain in either his right shoulder or his left hand.  PRECAUTIONS: None  RED FLAGS: None   WEIGHT BEARING RESTRICTIONS: Yes NWB in Lt thumb now     SUBJECTIVE:   SUBJECTIVE STATEMENT: 4 weeks post fx now. He states his thumb is not hurting him and he has not taken off his immobilization orthosis.  He is following up for his shoulder on Friday and with Dr. Roda Shutters for his thumb next Tuesday  Pt accompanied by: interpreter: Bryan Mccullough   PAIN:  Are you having pain? Yes: NPRS scale: 0-1/10 Pain location: Lt thumb  Pain description: throbbing Aggravating factors: motion Relieving factors: rest  FALLS: Has patient fallen in last 6 months? Yes. Number of falls 1 (this accident)    PLOF: Independent  PATIENT  GOALS: To safely heal the left thumb fracture and get back to full use in the left hand    OBJECTIVE: (All objective assessments below are from initial evaluation on: 09/08/23 unless otherwise specified.)   HAND DOMINANCE: Right   ADLs: Overall ADLs: States decreased ability to grab, hold household objects, pain and difficulty to open containers, perform FMS tasks (manipulate fasteners on clothing), mild to moderate bathing problems as well.    FUNCTIONAL OUTCOME MEASURES: Eval: Quick DASH 24% impairment today  (Higher % Score  =  More Impairment)     UPPER EXTREMITY ROM     Shoulder to Wrist AROM Left eval  Lt 09/22/23  Wrist flexion WNL 47 (60 Rt)  Wrist extension WNL 60 (63 Rt)   (Blank rows = not tested)   Hand AROM Left eval Lt 09/22/23  Full Fist Ability (or Gap to Distal Palmar Crease) Fingers touch palm (thumb not included)    Thumb Opposition  (Kapandji Scale)  6/10 with orthosis on    Thumb MCP (0-60) Approx 0-40 0 - 45  Thumb IP (0-80) 0 0 - 5  (Blank rows = not tested)   UPPER EXTREMITY MMT:    Eval:  NT at eval due to recent and still healing injuries. Will be tested when appropriate.   MMT Left TBD  Elbow flexion   Elbow extension   Wrist flexion   Wrist extension   (Blank rows = not tested)  HAND FUNCTION: Eval: Observed weakness in affected Lt hand.  Details will be tested when safe Grip strength Right: TBD lbs, Left: TBD lbs   COORDINATION: Eval: Observed coordination impairments with affected Lt hand.  Details will be tested when time allows 9 Hole Peg Test Left: TBD sec (TBD sec is [facility])   SENSATION: Eval:  Light touch intact today, though somewhat diminished due to pain around the IP joint and T2  EDEMA:   Eval:  Mildly swollen in left thumb and hand today  OBSERVATIONS:   Eval: Nail has some discoloration and bruising, his thumb is very stiff and tender to touch, he is not very worried or cautious in his back states using his other digits to carry groceries and bags even yesterday.  He may be a bit impulsive and not completely thinking of safety in terms of his fracture and also right shoulder injury (as seen by his wearing his sling incorrectly his arm was basically not supported at all inside of this shoulder sling.  OT did help and fix this issue.) (Thumb tuft fx)    TODAY'S TREATMENT:  09/22/23: OT checks his orthosis and makes several small adjustments as he prefers using a Coban wrap to keep it on versus the elastic strapping.  OT then reviews his home exercise program and attempts light gentle range of motion at the DIP joint which he attempts  and is nonpainful but very very stiff.  OT explains to him many times through the interpreter that he should not be grabbing pushing or pulling or putting any force through his thumb but that it is okay to start gentle active range of motion at the DIP joint 4-6 times a day as tolerated and as bolded below and his updated exercise program.  He trials opposition without brace and thumb composite flexion without brace and has no pain but is very stiff.  OT takes new range measure motions for baseline.  He has no questions at the end of the session, no pain.  He was told  he can carefully take off his orthosis to also clean and dry his hand carefully with no pressure on the DIP joint.  He states understanding and leaves in no pain   Exercises - Bend and Pull Back Wrist SLOWLY  - 4 x daily - 10-15 reps - Tendon Glides  - 4-6 x daily - 3-5 reps - 2-3 seconds hold - Seated Thumb Circumduction AROM  - 4-6 x daily - 10-15 reps - Thumb Opposition  - 4-6 x daily - 10 reps without brace - Seated Composite Thumb Flexion AROM  - 4-6 x daily - 1 sets - 10-15 reps without brace    PATIENT EDUCATION: Education details: See tx section above for details  Person educated: Patient Education method: Verbal Instruction, Teach back, Handouts  Education comprehension: States and demonstrates understanding, Additional Education required    HOME EXERCISE PROGRAM: Access Code: [email or web address] URL: [email or web address] Date: 09/08/2023 Prepared by: Fannie Knee   GOALS: Goals reviewed with patient? Yes   SHORT TERM GOALS: (STG required if POC>30 days) Target Date: 09/08/23  Pt will obtain protective, custom orthotic. Goal status:  MET   2.  Pt will demo/state understanding of initial HEP to improve pain levels and prerequisite motion. Goal status: 10//24: MET   LONG TERM GOALS: Target Date: 10/08/23  Pt will improve functional ability by decreased impairment per Quick DASH assessment from 24%  to 5% or better, for better quality of life. Goal status: INITIAL  2.  Pt will improve grip strength in Lt hand to at least 25lbs for functional use at home and in IADLs. Goal status: INITIAL  3.  Pt will improve A/ROM in Lt thumb DIP flexion from 0* to at least 30*, to have functional motion for tasks like reach and grasp.  Goal status: INITIAL  4.  Pt will improve coordination skills in Lt hand, as seen by [facility] score on 9HPT testing to have increased functional ability to carry out fine motor tasks (fasteners, etc.) and more complex, coordinated IADLs (meal prep, sports, etc.).  Goal status: INITIAL    ASSESSMENT:  CLINICAL IMPRESSION: 09/22/23: He is doing well and attempting to move at 4 weeks postinjury but it was explained to him that he has not healed so he should be very careful and not cause pain.  Otherwise he should be immobilized at all times.  Eval: Patient is a 82 y.o. male who was seen today for occupational therapy evaluation for fractured left hand thumb distal phalanx and subsequent decreased functional ability.  He will benefit from outpatient occupational therapy to safely help him heal but also maximize ability and function with his left nondominant hand.  This issue is complicated by the fact that he has a right shoulder injury and clavicle fracture and is awaiting an MRI.     PLAN:  OT FREQUENCY: 1x/week  OT DURATION: 6 weeks through 11/1/524 as needed up to 5 total visits   PLANNED INTERVENTIONS: self care/ADL training, therapeutic exercise, therapeutic activity, neuromuscular re-education, manual therapy, passive range of motion, splinting, electrical stimulation, ultrasound, fluidotherapy, compression bandaging, moist heat, cryotherapy, contrast bath, patient/family education, and coping strategies training  CONSULTED AND AGREED WITH PLAN OF CARE: Patient  PLAN FOR NEXT SESSION:   Follow-up with him after his next meeting with Dr.Xu, to determine healing  status and also recommendations for continuing mobilization of the DIP joint if all goes well, AROM will turn into light stretches at 6 weeks post fracture and any strengthening will be withheld till  8 weeks or more post fracture.   Fannie Knee, OTR/L ,CHT 09/22/2023, 2:27 PM

## 2023-09-22 ENCOUNTER — Ambulatory Visit (INDEPENDENT_AMBULATORY_CARE_PROVIDER_SITE_OTHER): Payer: Medicare HMO | Admitting: Rehabilitative and Restorative Service Providers"

## 2023-09-22 ENCOUNTER — Encounter: Payer: Self-pay | Admitting: Rehabilitative and Restorative Service Providers"

## 2023-09-22 DIAGNOSIS — R600 Localized edema: Secondary | ICD-10-CM

## 2023-09-22 DIAGNOSIS — R278 Other lack of coordination: Secondary | ICD-10-CM | POA: Diagnosis not present

## 2023-09-22 DIAGNOSIS — M6281 Muscle weakness (generalized): Secondary | ICD-10-CM | POA: Diagnosis not present

## 2023-09-22 DIAGNOSIS — M25542 Pain in joints of left hand: Secondary | ICD-10-CM

## 2023-09-24 ENCOUNTER — Encounter: Payer: Self-pay | Admitting: Orthopedic Surgery

## 2023-09-24 ENCOUNTER — Telehealth: Payer: Self-pay

## 2023-09-24 ENCOUNTER — Ambulatory Visit: Payer: Medicare HMO | Admitting: Orthopaedic Surgery

## 2023-09-24 ENCOUNTER — Ambulatory Visit: Payer: Medicare HMO | Admitting: Orthopedic Surgery

## 2023-09-24 DIAGNOSIS — M25512 Pain in left shoulder: Secondary | ICD-10-CM

## 2023-09-24 DIAGNOSIS — M25511 Pain in right shoulder: Secondary | ICD-10-CM

## 2023-09-24 NOTE — Telephone Encounter (Signed)
Transition Care Management Unsuccessful Follow-up Telephone Call  Date of discharge and from where:  08/23/2023 Bryan Mccullough [facility]  Attempts:  1st Attempt  Reason for unsuccessful TCM follow-up call:  Left voice message Called patient with assistance from [facility] Interpreter ID [id number]  Bryan Mccullough [facility]  [facility], [facility] Guide Direct Dial: [phone]  Website: [email or web address]

## 2023-09-24 NOTE — Progress Notes (Signed)
Office Visit Note   Patient: Bryan Mccullough           Date of Birth: 04-26-1941           MRN: [id number] Visit Date: 09/24/2023 Requested by: Donita Brooks, MD [address] [place],  [place] [address] PCP: Donita Brooks, MD  Subjective: Chief Complaint  Patient presents with   Right Shoulder - Follow-up    HPI: Bryan Mccullough is a 82 y.o. male who presents to the office reporting right shoulder pain.  Since he was last seen has had an MRI scan.  Date of injury was about 4 weeks ago.  Currently seeing Dr. Deno Etienne for lateral third clavicle fracture.  MRI scan is reviewed with the patient he does have a moderately displaced and retracted supraspinatus tear.  He wants to avoid surgery.  Patient does have the ability to forward flex and AB duct above 90 degrees..                ROS: All systems reviewed are negative as they relate to the chief complaint within the history of present illness.  Patient denies fevers or chills.  Assessment & Plan: Visit Diagnoses:  1. Acute pain of both shoulders     Plan: Impression is rotator cuff tear supraspinatus with fairly reasonable function remaining.  He does not really want to proceed with surgical intervention.  Clavicle fracture healing well clinically.  Also looks fairly reasonable on the MRI scan.  From my perspective he would be okay to start physical therapy at the end of next week once he gets clearance from Dr. Roda Shutters regarding his clavicle.  He can follow-up in 6 weeks after a course of physical therapy with Franky Macho.  Follow-Up Instructions: No follow-ups on file.   Orders:  No orders of the defined types were placed in this encounter.  No orders of the defined types were placed in this encounter.     Procedures: No procedures performed   Clinical Data: No additional findings.  Objective: Vital Signs: There were no vitals taken for this visit.  Physical Exam:  Constitutional: Patient appears well-developed HEENT:   Head: Normocephalic Eyes:EOM are normal Neck: Normal range of motion Cardiovascular: Normal rate Pulmonary/chest: Effort normal Neurologic: Patient is alert Skin: Skin is warm Psychiatric: Patient has normal mood and affect  Ortho Exam: Ortho examination unchanged from prior visit.  Scott slight external rotation weakness on the right compared to the left.  Is able to forward flex and AB duct both above 90 degrees.  Motor or sensory function of the hand is intact.  Lateral in the clavicle not too tender.  Specialty Comments:  No specialty comments available.  Imaging: No results found.   PMFS History: Patient Active Problem List   Diagnosis Date Noted   Diabetes mellitus type 2 in nonobese (HCC)    HLD (hyperlipidemia)    BPH with elevated PSA    Esophageal stricture    Erectile dysfunction    Lumbosacral root lesions, not elsewhere classified 03/03/2013   Past Medical History:  Diagnosis Date   BPH with elevated PSA    Diabetes mellitus type 2 in nonobese [facility])    Diverticulosis    Erectile dysfunction    Esophageal stricture    GERD (gastroesophageal reflux disease)    Hiatal hernia    HLD (hyperlipidemia)     Family History  Problem Relation Age of Onset   Heart disease Brother    Stroke Brother  Past Surgical History:  Procedure Laterality Date   PROSTATE SURGERY     Social History   Occupational History   Not on file  Tobacco Use   Smoking status: Never   Smokeless tobacco: Never  Substance and Sexual Activity   Alcohol use: No   Drug use: No   Sexual activity: Yes    Comment: married, bus driver

## 2023-09-27 ENCOUNTER — Telehealth: Payer: Self-pay

## 2023-09-27 ENCOUNTER — Telehealth: Payer: Self-pay | Admitting: Orthopaedic Surgery

## 2023-09-27 NOTE — Telephone Encounter (Signed)
-----   Message from Burnard Bunting sent at 09/24/2023  9:44 PM EDT ----- Please follow-up Bryan Mccullough 6 weeks thanks

## 2023-09-27 NOTE — Telephone Encounter (Signed)
Needs 6wk follow up with Bryan Mccullough per Dr Alfonso Patten note

## 2023-09-27 NOTE — Telephone Encounter (Signed)
Transition Care Management Unsuccessful Follow-up Telephone Call  Date of discharge and from where:  08/22/2023 [facility]. Called patient with assistance from [facility] ID [id number].  Attempts:  2nd Attempt  Reason for unsuccessful TCM follow-up call:  Left voice message  [place] [facility]  [facility], [facility] Guide Direct Dial: [phone]  Website: [email or web address]

## 2023-09-27 NOTE — Telephone Encounter (Signed)
Mailed reminder letter to patient today 

## 2023-09-28 ENCOUNTER — Other Ambulatory Visit (INDEPENDENT_AMBULATORY_CARE_PROVIDER_SITE_OTHER): Payer: Self-pay

## 2023-09-28 ENCOUNTER — Other Ambulatory Visit (INDEPENDENT_AMBULATORY_CARE_PROVIDER_SITE_OTHER): Payer: Medicare HMO

## 2023-09-28 ENCOUNTER — Ambulatory Visit: Payer: Medicare HMO | Admitting: Orthopaedic Surgery

## 2023-09-28 DIAGNOSIS — S42031A Displaced fracture of lateral end of right clavicle, initial encounter for closed fracture: Secondary | ICD-10-CM

## 2023-09-28 DIAGNOSIS — S62525A Nondisplaced fracture of distal phalanx of left thumb, initial encounter for closed fracture: Secondary | ICD-10-CM

## 2023-09-28 NOTE — Progress Notes (Signed)
Office Visit Note   Patient: Bryan Mccullough           Date of Birth: 02/09/1941           MRN: [id number] Visit Date: 09/28/2023              Requested by: Donita Brooks, MD [address] [facility] [address] [place],  [place] [address] PCP: Donita Brooks, MD   Assessment & Plan: Visit Diagnoses:  1. Nondisplaced fracture of distal phalanx of left thumb, initial encounter for closed fracture   2. Traumatic closed fracture of distal clavicle with minimal displacement, right, initial encounter     Plan: Patient is now 6 weeks status post injuries.  At this time, he is ready for PT for the shoulder and cuff tear.  He's already in OT for the left thumb.  Will recheck him in 6 weeks with repeat x-rays of the right clavicle and left thumb.  Follow-Up Instructions: Return in about 6 weeks (around 11/09/2023).   Orders:  Orders Placed This Encounter  Procedures   XR Clavicle Right   XR Finger Thumb Left   Ambulatory referral to Physical Therapy   No orders of the defined types were placed in this encounter.     Procedures: No procedures performed   Clinical Data: No additional findings.   Subjective: Chief Complaint  Patient presents with   Right Shoulder - Follow-up    Clavicle   Left Thumb - Follow-up    HPI Returns today for follow-up of his injuries.  He is about 6 weeks from the injury.  Review of Systems  Constitutional: Negative.   HENT: Negative.    Eyes: Negative.   Respiratory: Negative.    Cardiovascular: Negative.   Gastrointestinal: Negative.   Endocrine: Negative.   Genitourinary: Negative.   Skin: Negative.   Allergic/Immunologic: Negative.   Neurological: Negative.   Hematological: Negative.   Psychiatric/Behavioral: Negative.    All other systems reviewed and are negative.    Objective: Vital Signs: There were no vitals taken for this visit.  Physical Exam Vitals and nursing note reviewed.  Constitutional:      Appearance: He is  well-developed.  Pulmonary:     Effort: Pulmonary effort is normal.  Abdominal:     Palpations: Abdomen is soft.  Skin:    General: Skin is warm.  Neurological:     Mental Status: He is alert and oriented to person, place, and time.  Psychiatric:        Behavior: Behavior normal.        Thought Content: Thought content normal.        Judgment: Judgment normal.     Ortho Exam Exam of the right shoulder shows external rotation behind the head.  Abduction and flexion to greater than 90 degrees without pain or difficulty.  Exam of the left thumb shows minimal swelling and no tenderness to palpation.  Range of motion of the IP joint is improving. Specialty Comments:  No specialty comments available.  Imaging: No results found.   PMFS History: Patient Active Problem List   Diagnosis Date Noted   Diabetes mellitus type 2 in nonobese (HCC)    HLD (hyperlipidemia)    BPH with elevated PSA    Esophageal stricture    Erectile dysfunction    Lumbosacral root lesions, not elsewhere classified 03/03/2013   Past Medical History:  Diagnosis Date   BPH with elevated PSA    Diabetes mellitus type 2 in nonobese (  HCC)    Diverticulosis    Erectile dysfunction    Esophageal stricture    GERD (gastroesophageal reflux disease)    Hiatal hernia    HLD (hyperlipidemia)     Family History  Problem Relation Age of Onset   Heart disease Brother    Stroke Brother     Past Surgical History:  Procedure Laterality Date   PROSTATE SURGERY     Social History   Occupational History   Not on file  Tobacco Use   Smoking status: Never   Smokeless tobacco: Never  Substance and Sexual Activity   Alcohol use: No   Drug use: No   Sexual activity: Yes    Comment: married, bus driver

## 2023-10-07 DIAGNOSIS — Z008 Encounter for other general examination: Secondary | ICD-10-CM | POA: Diagnosis not present

## 2023-10-08 ENCOUNTER — Other Ambulatory Visit: Payer: Medicare HMO

## 2023-10-08 ENCOUNTER — Other Ambulatory Visit (INDEPENDENT_AMBULATORY_CARE_PROVIDER_SITE_OTHER): Payer: Medicare HMO

## 2023-10-08 ENCOUNTER — Telehealth: Payer: Self-pay

## 2023-10-08 ENCOUNTER — Other Ambulatory Visit: Payer: Self-pay | Admitting: Family Medicine

## 2023-10-08 DIAGNOSIS — Z23 Encounter for immunization: Secondary | ICD-10-CM | POA: Diagnosis not present

## 2023-10-08 DIAGNOSIS — E119 Type 2 diabetes mellitus without complications: Secondary | ICD-10-CM

## 2023-10-08 NOTE — Telephone Encounter (Signed)
Pt came by office with letter from [employer] to schedule repeat, due 08/20/2023. Pt would like the okay from you to repeat. Okay to order for him? Thank you.

## 2023-10-09 LAB — HEMOGLOBIN A1C
Hgb A1c MFr Bld: 6.9 %{Hb} — ABNORMAL HIGH (ref <5.7)
Mean Plasma Glucose: 151 mg/dL
eAG (mmol/L): 8.4 mmol/L

## 2023-10-15 NOTE — Therapy (Signed)
OUTPATIENT OCCUPATIONAL THERAPY TREATMENT NOTE  Patient Name: Bryan Mccullough MRN: [id number] DOB:07-30-1941, 82 y.o., male Today's Date: 10/15/2023  PCP: Lynnea Ferrier, MD REFERRING PROVIDER:  Tarry Kos, MD    END OF SESSION:     Past Medical History:  Diagnosis Date   BPH with elevated PSA    Diabetes mellitus type 2 in nonobese [facility])    Diverticulosis    Erectile dysfunction    Esophageal stricture    GERD (gastroesophageal reflux disease)    Hiatal hernia    HLD (hyperlipidemia)    Past Surgical History:  Procedure Laterality Date   PROSTATE SURGERY     Patient Active Problem List   Diagnosis Date Noted   Diabetes mellitus type 2 in nonobese (HCC)    HLD (hyperlipidemia)    BPH with elevated PSA    Esophageal stricture    Erectile dysfunction    Lumbosacral root lesions, not elsewhere classified 03/03/2013    ONSET DATE: 08/23/23 DOI  REFERRING DIAG: F09.323F (ICD-10-CM) - Nondisplaced fracture of distal phalanx of left thumb, initial encounter for closed fracture   THERAPY DIAG:  No diagnosis found.  Rationale for Evaluation and Treatment: Rehabilitation  PERTINENT HISTORY: Hand based splint for thumb distal phalanx fracture  "82 year old gentleman with mildly displaced right distal clavicle fracture and nondisplaced left distal phalanx fracture of the left thumb. We will plan to treat this nonoperatively. Sling to the right shoulder. Will provide him with a thumb spica brace and I will send a referral to hand therapy for thermoplastic hand-based splint. Recheck in 3 weeks with repeat x-rays of the right clavicle and left thumb. " He arrives with interpreter, states falling and jamming Lt thumb and hitting right shoulder. He is going to have MRI for shoulder next week. He is here to work on his Lt thumb fracture tuft fracture).  He states that he will not be in town next week to attend therapy as he is going to [place].  Fortunately he has not been  having much pain in either his right shoulder or his left hand.  PRECAUTIONS: None; RED FLAGS: None   WEIGHT BEARING RESTRICTIONS: Yes NWB in Lt thumb now     SUBJECTIVE:   SUBJECTIVE STATEMENT: 7 weeks post fx now. He states  ***    Pt accompanied by: interpreter: Darlina Rumpf   PAIN:  Are you having pain? *** Yes: NPRS scale: 0-1/10 Pain location: Lt thumb  Pain description: throbbing Aggravating factors: motion Relieving factors: rest  FALLS: Has patient fallen in last 6 months? Yes. Number of falls 1 (this accident)    PLOF: Independent  PATIENT GOALS: To safely heal the left thumb fracture and get back to full use in the left hand    OBJECTIVE: (All objective assessments below are from initial evaluation on: 09/08/23 unless otherwise specified.)   HAND DOMINANCE: Right   ADLs: Overall ADLs: States decreased ability to grab, hold household objects, pain and difficulty to open containers, perform FMS tasks (manipulate fasteners on clothing), mild to moderate bathing problems as well.    FUNCTIONAL OUTCOME MEASURES: 10/19/23: Quick DASH ***% impairment today  (Higher % Score  =  More Impairment)    Eval: Quick DASH 24% impairment today  (Higher % Score  =  More Impairment)     UPPER EXTREMITY ROM     Shoulder to Wrist AROM Left eval Lt 09/22/23 Lt 10/19/23  Wrist flexion WNL 47 (60 Rt) ***  Wrist extension WNL 60 (63 Rt)  ***  (  Blank rows = not tested)   Hand AROM Left eval Lt 09/22/23 Lt 10/19/23  Full Fist Ability (or Gap to Distal Palmar Crease) Fingers touch palm (thumb not included)     Thumb Opposition  (Kapandji Scale)  6/10 with orthosis on     Thumb MCP (0-60) Approx 0-40 0 - 45 0 - ***  Thumb IP (0-80) 0 0 - 5 0 - ***  (Blank rows = not tested)   UPPER EXTREMITY MMT:    Eval:  NT at eval due to recent and still healing injuries. Will be tested when appropriate.   MMT Left TBD  Elbow flexion   Elbow extension   Wrist flexion   Wrist  extension   (Blank rows = not tested)  HAND FUNCTION: Eval: Observed weakness in affected Lt hand.  Details will be tested when safe Grip strength Right: TBD lbs, Left: TBD lbs   COORDINATION:  10/19/23: 9 Hole Peg Test Left: *** sec (*** sec is WFL)    SENSATION: Eval:  Light touch intact today, though somewhat diminished due to pain around the IP joint and T2  EDEMA:   Eval:  Mildly swollen in left thumb and hand today  OBSERVATIONS:   Eval: Nail has some discoloration and bruising, his thumb is very stiff and tender to touch, he is not very worried or cautious in his back states using his other digits to carry groceries and bags even yesterday.  He may be a bit impulsive and not completely thinking of safety in terms of his fracture and also right shoulder injury (as seen by his wearing his sling incorrectly his arm was basically not supported at all inside of this shoulder sling.  OT did help and fix this issue.) (Thumb tuft fx)    TODAY'S TREATMENT:  10/19/23: ***  Follow-up with him after his next meeting with Dr.Xu, to determine healing status and also recommendations for continuing mobilization of the DIP joint if all goes well, AROM will turn into light stretches at 6 weeks post fracture and any strengthening will be withheld till 8 weeks or more post fracture.    09/22/23: OT checks his orthosis and makes several small adjustments as he prefers using a Coban wrap to keep it on versus the elastic strapping.  OT then reviews his home exercise program and attempts light gentle range of motion at the DIP joint which he attempts and is nonpainful but very very stiff.  OT explains to him many times through the interpreter that he should not be grabbing pushing or pulling or putting any force through his thumb but that it is okay to start gentle active range of motion at the DIP joint 4-6 times a day as tolerated and as bolded below and his updated exercise program.  He trials  opposition without brace and thumb composite flexion without brace and has no pain but is very stiff.  OT takes new range measure motions for baseline.  He has no questions at the end of the session, no pain.  He was told he can carefully take off his orthosis to also clean and dry his hand carefully with no pressure on the DIP joint.  He states understanding and leaves in no pain   Exercises - Bend and Pull Back Wrist SLOWLY  - 4 x daily - 10-15 reps - Tendon Glides  - 4-6 x daily - 3-5 reps - 2-3 seconds hold - Seated Thumb Circumduction AROM  - 4-6 x daily -  10-15 reps - Thumb Opposition  - 4-6 x daily - 10 reps without brace - Seated Composite Thumb Flexion AROM  - 4-6 x daily - 1 sets - 10-15 reps without brace    PATIENT EDUCATION: Education details: See tx section above for details  Person educated: Patient Education method: Verbal Instruction, Teach back, Handouts  Education comprehension: States and demonstrates understanding, Additional Education required    HOME EXERCISE PROGRAM: Access Code: [email or web address] URL: [email or web address] Date: 09/08/2023 Prepared by: Fannie Knee   GOALS: Goals reviewed with patient? Yes   SHORT TERM GOALS: (STG required if POC>30 days) Target Date: 09/08/23  Pt will obtain protective, custom orthotic. Goal status:  MET   2.  Pt will demo/state understanding of initial HEP to improve pain levels and prerequisite motion. Goal status: 10//24: MET   LONG TERM GOALS: Target Date: 10/08/23  Pt will improve functional ability by decreased impairment per Quick DASH assessment from 24% to 5% or better, for better quality of life. Goal status: 10/19/23: ***  2.  Pt will improve grip strength in Lt hand to at least 25lbs for functional use at home and in IADLs. Goal status: 10/19/23: ***  3.  Pt will improve A/ROM in Lt thumb DIP flexion from 0* to at least 30*, to have functional motion for tasks like reach and grasp.  Goal  status: 10/19/23: ***  4.  Pt will improve coordination skills in Lt hand, as seen by [facility] score on 9HPT testing to have increased functional ability to carry out fine motor tasks (fasteners, etc.) and more complex, coordinated IADLs (meal prep, sports, etc.).  Goal status:  10/19/23: ***    ASSESSMENT:  CLINICAL IMPRESSION: 10/19/23: ***  09/22/23: He is doing well and attempting to move at 4 weeks postinjury but it was explained to him that he has not healed so he should be very careful and not cause pain.  Otherwise he should be immobilized at all times.     PLAN:  OT FREQUENCY: 1x/week  OT DURATION: 6 weeks through 10/22/23 as needed up to 5 total visits   PLANNED INTERVENTIONS: self care/ADL training, therapeutic exercise, therapeutic activity, neuromuscular re-education, manual therapy, passive range of motion, splinting, electrical stimulation, ultrasound, fluidotherapy, compression bandaging, moist heat, cryotherapy, contrast bath, patient/family education, and coping strategies training  CONSULTED AND AGREED WITH PLAN OF CARE: Patient  PLAN FOR NEXT SESSION:   ***   Fannie Knee, OTR/L ,CHT 10/15/2023, 8:58 AM

## 2023-10-19 ENCOUNTER — Encounter: Payer: Self-pay | Admitting: Rehabilitative and Restorative Service Providers"

## 2023-10-19 ENCOUNTER — Ambulatory Visit: Payer: Medicare HMO | Admitting: Physical Therapy

## 2023-10-19 ENCOUNTER — Ambulatory Visit: Payer: Medicare HMO | Admitting: Rehabilitative and Restorative Service Providers"

## 2023-10-19 DIAGNOSIS — M25511 Pain in right shoulder: Secondary | ICD-10-CM

## 2023-10-19 DIAGNOSIS — M25542 Pain in joints of left hand: Secondary | ICD-10-CM | POA: Diagnosis not present

## 2023-10-19 DIAGNOSIS — G8929 Other chronic pain: Secondary | ICD-10-CM

## 2023-10-19 DIAGNOSIS — R600 Localized edema: Secondary | ICD-10-CM | POA: Diagnosis not present

## 2023-10-19 DIAGNOSIS — M6281 Muscle weakness (generalized): Secondary | ICD-10-CM

## 2023-10-19 DIAGNOSIS — M25642 Stiffness of left hand, not elsewhere classified: Secondary | ICD-10-CM

## 2023-10-19 DIAGNOSIS — R278 Other lack of coordination: Secondary | ICD-10-CM

## 2023-10-21 ENCOUNTER — Telehealth: Payer: Self-pay

## 2023-10-21 NOTE — Telephone Encounter (Signed)
Copied from CRM [id number]. Topic: Clinical - Lab/Test Results >> Oct 21, 2023 11:59 AM Bryan Mccullough wrote: Reason for CRM: patient needs lab results in spanish and to be highlighted  Sent pt a letter with his lab results in spanish. I highlighed them also.

## 2023-10-24 ENCOUNTER — Other Ambulatory Visit: Payer: Self-pay | Admitting: Family Medicine

## 2023-10-25 NOTE — Therapy (Addendum)
 OUTPATIENT OCCUPATIONAL THERAPY TREATMENT & DISCHARGE NOTE  Patient Name: Bryan Mccullough MRN: [id number] DOB:07-Apr-1941, 82 y.o., male Today's Date: 10/26/2023  PCP: Duanne Lowers, MD REFERRING PROVIDER:  Jerri Kay HERO, MD              OCCUPATIONAL THERAPY DISCHARGE SUMMARY  Visits from Start of Care: 4  Pt did not return to therapy, so goals could not be addressed.   Pt now officially discharged form OP OT.  See note below for additional details.   Melvenia Ada, OTR/L, CHT 08/03/24              END OF SESSION:  OT End of Session - 10/26/23 1349     Visit Number 4    Number of Visits 10    Date for OT Re-Evaluation 11/19/23    Authorization Type [employer]    OT Start Time 1350    OT Stop Time 1449    OT Time Calculation (min) 59 min    Equipment Utilized During Treatment pink putty, green t-band    Activity Tolerance Patient tolerated treatment well;No increased pain;Patient limited by fatigue    Behavior During Therapy [facility] for tasks assessed/performed                Past Medical History:  Diagnosis Date   BPH with elevated PSA    Diabetes mellitus type 2 in nonobese [facility])    Diverticulosis    Erectile dysfunction    Esophageal stricture    GERD (gastroesophageal reflux disease)    Hiatal hernia    HLD (hyperlipidemia)    Past Surgical History:  Procedure Laterality Date   PROSTATE SURGERY     Patient Active Problem List   Diagnosis Date Noted   Diabetes mellitus type 2 in nonobese (HCC)    HLD (hyperlipidemia)    BPH with elevated PSA    Esophageal stricture    Erectile dysfunction    Lumbosacral root lesions, not elsewhere classified 03/03/2013    ONSET DATE: 08/23/23 DOI  REFERRING DIAG: D37.474J (ICD-10-CM) - Nondisplaced fracture of distal phalanx of left thumb, initial encounter for closed fracture   THERAPY DIAG:  Localized edema  Other lack of coordination  Muscle weakness (generalized)  Pain in  joint of left hand  Chronic right shoulder pain  Stiffness of left hand, not elsewhere classified  Rationale for Evaluation and Treatment: Rehabilitation  PERTINENT HISTORY: Hand based splint for thumb distal phalanx fracture  82 year old gentleman with mildly displaced right distal clavicle fracture and nondisplaced left distal phalanx fracture of the left thumb. We will plan to treat this nonoperatively. Sling to the right shoulder. Will provide him with a thumb spica brace and I will send a referral to hand therapy for thermoplastic hand-based splint. Recheck in 3 weeks with repeat x-rays of the right clavicle and left thumb.  He arrives with interpreter, states falling and jamming Lt thumb and hitting right shoulder. He is going to have MRI for shoulder next week. He is here to work on his Lt thumb fracture tuft fracture).  He states that he will not be in town next week to attend therapy as he is going to [place] .  Fortunately he has not been having much pain in either his right shoulder or his left hand.  PRECAUTIONS: None; RED FLAGS: None   WEIGHT BEARING RESTRICTIONS: Yes NWB in Lt thumb now     SUBJECTIVE:   SUBJECTIVE STATEMENT: 8  weeks post thumb fx now. With interpretor help,  he states not having any significant left hand pain, but his right shoulder is a little sore as well as his left bicep feels a bit tight today.  He also mentions some pain in his shoulders when sleeping and he has been avoiding sleeping on the right shoulder.   Pt accompanied ab:pwuzmemzunm Alexander   PAIN:  Are you having pain?  Yes: NPRS scale:  1-2/10 Lt thumb, 2/10 in day and 5-6/10 at night  in Rt shoulder Pain location: Lt thumb IP J; Rt shoulder with reach Pain description: throbbing Aggravating factors: motion Relieving factors: rest   PATIENT GOALS: To safely heal the left thumb fracture and get back to full use in the left hand    OBJECTIVE: (All objective assessments below are  from initial evaluation on: 09/08/23 unless otherwise specified.)   HAND DOMINANCE: Right   ADLs: Overall ADLs: States decreased ability to grab, hold household objects, pain and difficulty to open containers, perform FMS tasks (manipulate fasteners on clothing), mild to moderate bathing problems as well.    FUNCTIONAL OUTCOME MEASURES: 10/19/23: Quick DASH 22% impairment today  (Higher % Score  =  More Impairment)    Eval: Quick DASH 24% impairment today  (Higher % Score  =  More Impairment)     UPPER EXTREMITY ROM      Shoulder to Wrist AROM Lt 09/22/23 Rt / Lt 10/19/23  Shoulder flexion  113 Rt  Shoulder abduction  118 Rt  Shoulder extension  64 Rt  Shoulder internal rotation  17 Rt  Shoulder external rotation  46 Rt  Elbow flexion  WNL Rt  Elbow extension  WNL Rt  Forearm supination    Forearm pronation     Wrist flexion 47 (60* Rt) 59 Lt  Wrist extension 60 (63* Rt) 58 Lt  Wrist ulnar deviation    Wrist radial deviation    Functional dart thrower's motion (F-DTM) in ulnar flexion    F-DTM in radial extension     (Blank rows = not tested)    Hand AROM Left eval Lt 09/22/23 Lt 10/19/23  Full Fist Ability (or Gap to Distal Palmar Crease) Fingers touch palm (thumb not included)     Thumb Opposition  (Kapandji Scale)  6/10 with orthosis on     Thumb MCP (0-60) Approx 0-40 0 - 45 0 - 44  Thumb IP (0-80) 0 0 - 5 0 - 48  (Blank rows = not tested)   UPPER EXTREMITY MMT:     MMT Right 10/19/23  Shoulder flexion 4+/5  Shoulder abduction 4+/5  Shoulder adduction   Shoulder extension 5/5  Shoulder internal rotation 4+/5  Shoulder external rotation 4-/5 pain  Middle trapezius   Lower trapezius   Elbow flexion   Elbow extension   (Blank rows = not tested)   HAND FUNCTION: 10/26/23: Grip strength Right: 47 lbs, Left: 39 lbs   SENSATION: Eval:  Light touch intact today, though somewhat diminished due to pain around the IP joint and T2  EDEMA:   Eval:   Mildly swollen in left thumb and hand today  OBSERVATIONS:   Eval: Nail has some discoloration and bruising, his thumb is very stiff and tender to touch, he is not very worried or cautious in his back states using his other digits to carry groceries and bags even yesterday.  He may be a bit impulsive and not completely thinking of safety in terms of his fracture and also right shoulder injury (as seen by his wearing  his sling incorrectly his arm was basically not supported at all inside of this shoulder sling.  OT did help and fix this issue.) (Thumb tuft fx)    TODAY'S TREATMENT:  10/26/23: OT upgrades in today for the left hand and thumb to light putty strengthening activities which was new at 8 weeks post fx and bolded below.  He understands not to squeeze so harshly or forcefully that it causes himself pain.    Next, he complains about pain in his shoulders when sleeping, so she has been lied down and demonstrate this.  OT recommends sleep positions that are more healthy for his shoulders and reeducates on the sleeper stretch today.  He performs this bilaterally, states this targets his sore areas but is not painful.  Also to help combat poor postures and upper back kyphosis, OT educates on strengthening with scapular and shoulder rows/retraction.  He does this with green therapy band and has no pain.  His bicep does feel mild straining in the left shoulder, so OT gives him an appropriate bicep stretch and adds this to his program as well.  New exercises and stretches are bolded below.  He states understanding all of the stretches and HEP after today's one-on-one training and education including an interpreter in person.   He does mention having difficulty paying co-pays and wonders if he feels better does he need to return.  OT tells him to cancel his appointments in the future if he is feeling excellent and has no questions or concerns, otherwise he should return next Tuesday.  He states  understanding all directions and leaves in no significant pain.   Shoulder Exercises - Seated Shoulder Flexion Towel Slide at Table Top  - 4 x daily - 3-5 reps - 15 hold - Seated Shoulder Abduction Towel Slide at Table Top  - 3-4 x daily - 3-5 reps - 15 hold - Doorway Stretches  - 4-5 x daily - 3 reps - 15 sec hold - Seated Shoulder External Rotation PROM on Table  - 3-4 x daily - 3-5 reps - 15 sec hold - Sleeper Stretch  - 3-4 x daily - 3-5 reps - 15 hold - Standing Shoulder Row with Anchored Resistance  - 2-4 x daily - 1-2 sets - 10-15 reps - Fridge Door Stretch  - 4 x daily - 3-5 reps - 15 sec hold  Thumb Exercises/Activities - Seated Composite Thumb Flexion PROM  - 2-3 x daily - 3-5 reps - 15 sec hold - Full Fist  - 2-3 x daily - 5 reps - Thumb Opposition with Putty  - 2-3 x daily - 5 reps  PATIENT EDUCATION: Education details: See tx section above for details  Person educated: Patient Education method: Verbal Instruction, Teach back, Handouts  Education comprehension: States and demonstrates understanding, Additional Education required    HOME EXERCISE PROGRAM: Access Code: [email or web address]  (THUMB) URL: [email or web address] Date: 09/08/2023 Prepared by: Melvenia Ada  Access Code: [email or web address]  (SHOULDER) URL: [email or web address] Date: 10/19/2023 Prepared by: Melvenia Ada   GOALS: Goals reviewed with patient? Yes   SHORT TERM GOALS: (STG required if POC>30 days) Target Date: 09/08/23  Pt will obtain protective, custom orthotic. Goal status:  MET   2.  Pt will demo/state understanding of initial HEP to improve pain levels and prerequisite motion. Goal status: 10/26/23: MET   LONG TERM GOALS: Target Date: 11/19/23  Pt will improve functional ability by decreased impairment per Quick DASH assessment from 24% to 5% or better, for better  quality of life. Goal status: 10/19/23: Now 22% but updated to consider shoulder issues Rt side  2.   Pt will improve grip strength in Lt hand to at least 25lbs for functional use at home and in IADLs. Goal status: 10/19/23: NT today, as he is not yet 8 weeks post fx (will be addressed next session)  3.  Pt will improve A/ROM in Lt thumb DIP flexion from 0* to at least 30*, to have functional motion for tasks like reach and grasp.  Goal status: 10/19/23: MET, now 48*   4.  Pt will improve coordination skills in Lt hand, as seen by [facility] score on 9HPT testing to have increased functional ability to carry out fine motor tasks (fasteners, etc.) and more complex, coordinated IADLs (meal prep, sports, etc.).  Goal status:  10/19/23: NT today due to time and addressing new shoulder POC (will be addressed)   5. Pt will improve A/ROM in Rt shoulder flex/abd from aprox 115* each to at least 145* each*, to have functional motion for tasks like overhead reach for hair care and washing.   Goal status: 10/19/23: NEW GOAL TODAY  6.  Pt will improve strength in Rt shoulder ext rotation form painful 4-/5 MMT to at least non-painful 4+/5 MMT to improve functional use with home tasks, chores, lawn care, etc.  Goal status: 10/19/23: NEW GOAL TODAY    ASSESSMENT:  CLINICAL IMPRESSION: 10/26/23: He is doing very well and aside from some minor aches and pains in the shoulders and the thumb, he has no significant pain or problems.  He seems to understand his program for both his shoulder and his left thumb, and he just needs to be consistent with this and not hurt himself also break habits that cause him to have poor postures.  He did mention some difficulty with co-pays and he may cancel his remaining appointments if he feels that he understands his current plan of care.  He is welcome to come back as often as he needs though.   PLAN:  OT FREQUENCY: 1-2x/week  OT DURATION: 4 additional weeks through 11/19/23 as needed up to 10 total visits   PLANNED INTERVENTIONS: self care/ADL training, therapeutic exercise,  therapeutic activity, neuromuscular re-education, manual therapy, passive range of motion, splinting, electrical stimulation, ultrasound, fluidotherapy, compression bandaging, moist heat, cryotherapy, contrast bath, patient/family education, and coping strategies training  CONSULTED AND AGREED WITH PLAN OF CARE: Patient  PLAN FOR NEXT SESSION:   Check new left hand strengthening as well as comprehensive right shoulder program, address any questions and concerns or pain and needs.  If he does not return in 4 weeks, discharged assuming all goals have been met   Melvenia Ada, OTR/L ,CHT 10/26/2023, 3:20 PM

## 2023-10-26 ENCOUNTER — Ambulatory Visit: Payer: Medicare HMO | Admitting: Rehabilitative and Restorative Service Providers"

## 2023-10-26 ENCOUNTER — Encounter: Payer: Self-pay | Admitting: Rehabilitative and Restorative Service Providers"

## 2023-10-26 DIAGNOSIS — M25542 Pain in joints of left hand: Secondary | ICD-10-CM

## 2023-10-26 DIAGNOSIS — R278 Other lack of coordination: Secondary | ICD-10-CM | POA: Diagnosis not present

## 2023-10-26 DIAGNOSIS — R600 Localized edema: Secondary | ICD-10-CM

## 2023-10-26 DIAGNOSIS — G8929 Other chronic pain: Secondary | ICD-10-CM

## 2023-10-26 DIAGNOSIS — M6281 Muscle weakness (generalized): Secondary | ICD-10-CM | POA: Diagnosis not present

## 2023-10-26 DIAGNOSIS — M25642 Stiffness of left hand, not elsewhere classified: Secondary | ICD-10-CM

## 2023-10-26 DIAGNOSIS — M25511 Pain in right shoulder: Secondary | ICD-10-CM

## 2023-11-01 ENCOUNTER — Ambulatory Visit: Payer: Medicare HMO | Admitting: Surgical

## 2023-11-02 ENCOUNTER — Encounter: Payer: Medicare HMO | Admitting: Rehabilitative and Restorative Service Providers"

## 2023-11-08 NOTE — Therapy (Incomplete)
OUTPATIENT OCCUPATIONAL THERAPY TREATMENT *** NOTE  Patient Name: Bryan Mccullough MRN: [id number] DOB:1941-09-09, 82 y.o., male Today's Date: 11/08/2023  PCP: Lynnea Ferrier, MD REFERRING PROVIDER:  Tarry Kos, MD      END OF SESSION:       Past Medical History:  Diagnosis Date   BPH with elevated PSA    Diabetes mellitus type 2 in nonobese [facility])    Diverticulosis    Erectile dysfunction    Esophageal stricture    GERD (gastroesophageal reflux disease)    Hiatal hernia    HLD (hyperlipidemia)    Past Surgical History:  Procedure Laterality Date   PROSTATE SURGERY     Patient Active Problem List   Diagnosis Date Noted   Diabetes mellitus type 2 in nonobese (HCC)    HLD (hyperlipidemia)    BPH with elevated PSA    Esophageal stricture    Erectile dysfunction    Lumbosacral root lesions, not elsewhere classified 03/03/2013    ONSET DATE: 08/23/23 DOI  REFERRING DIAG: U27.253G (ICD-10-CM) - Nondisplaced fracture of distal phalanx of left thumb, initial encounter for closed fracture   THERAPY DIAG:  No diagnosis found.  Rationale for Evaluation and Treatment: Rehabilitation  PERTINENT HISTORY: Hand based splint for thumb distal phalanx fracture  "82 year old gentleman with mildly displaced right distal clavicle fracture and nondisplaced left distal phalanx fracture of the left thumb. We will plan to treat this nonoperatively. Sling to the right shoulder. Will provide him with a thumb spica brace and I will send a referral to hand therapy for thermoplastic hand-based splint. Recheck in 3 weeks with repeat x-rays of the right clavicle and left thumb. " He arrives with interpreter, states falling and jamming Lt thumb and hitting right shoulder. He is going to have MRI for shoulder next week. He is here to work on his Lt thumb fracture tuft fracture).  He states that he will not be in town next week to attend therapy as he is going to [place].  Fortunately he has  not been having much pain in either his right shoulder or his left hand.  PRECAUTIONS: None; RED FLAGS: None   WEIGHT BEARING RESTRICTIONS: Yes NWB in Lt thumb now     SUBJECTIVE:   SUBJECTIVE STATEMENT: 11 weeks post thumb fx now. With interpretor help, he states ***  not having any significant left hand pain, but his right shoulder is a little sore as well as his left bicep feels a bit tight today.  He also mentions some pain in his shoulders when sleeping and he has been avoiding sleeping on the right shoulder.   Pt accompanied UY:[id number]    PAIN:  Are you having pain?  Yes: NPRS scale: *** 1-2/10 Lt thumb, 2/10 in day and 5-6/10 at night  in Rt shoulder Pain location: Lt thumb IP J; Rt shoulder with reach Pain description: throbbing Aggravating factors: motion Relieving factors: rest   PATIENT GOALS: To safely heal the left thumb fracture and get back to full use in the left hand    OBJECTIVE: (All objective assessments below are from initial evaluation on: 09/08/23 unless otherwise specified.)   HAND DOMINANCE: Right   ADLs: Overall ADLs: States decreased ability to grab, hold household objects, pain and difficulty to open containers, perform FMS tasks (manipulate fasteners on clothing), mild to moderate bathing problems as well.    FUNCTIONAL OUTCOME MEASURES: 10/19/23: Quick DASH 22% impairment today  (Higher % Score  =  More Impairment)  Eval: Quick DASH 24% impairment today  (Higher % Score  =  More Impairment)     UPPER EXTREMITY ROM      Shoulder to Wrist AROM Lt 09/22/23 Rt / Lt 10/19/23 Rt 11/09/23  Shoulder flexion  113 Rt ***  Shoulder abduction  118 Rt ***  Shoulder extension  64 Rt ***  Shoulder internal rotation  17 Rt ***  Shoulder external rotation  46 Rt ***  Elbow flexion  WNL Rt   Elbow extension  WNL Rt   Forearm supination     Forearm pronation      Wrist flexion 47 (60* Rt) 59 Lt   Wrist extension 60 (63* Rt) 58 Lt   Wrist  ulnar deviation     Wrist radial deviation     Functional dart thrower's motion (F-DTM) in ulnar flexion     F-DTM in radial extension      (Blank rows = not tested)    Hand AROM Left eval Lt 09/22/23 Lt 10/19/23  Full Fist Ability (or Gap to Distal Palmar Crease) Fingers touch palm (thumb not included)     Thumb Opposition  (Kapandji Scale)  6/10 with orthosis on     Thumb MCP (0-60) Approx 0-40 0 - 45 0 - 44  Thumb IP (0-80) 0 0 - 5 0 - 48  (Blank rows = not tested)   UPPER EXTREMITY MMT:     MMT Right 10/19/23  Shoulder flexion 4+/5  Shoulder abduction 4+/5  Shoulder adduction   Shoulder extension 5/5  Shoulder internal rotation 4+/5  Shoulder external rotation 4-/5 pain  Middle trapezius   Lower trapezius   Elbow flexion   Elbow extension   (Blank rows = not tested)   HAND FUNCTION: 11/09/23: Grip strength Right: *** lbs, Left: *** lbs  9HPT Coordination Rt: ***sec, Lt ***sec    10/26/23: Grip strength Right: 47 lbs, Left: 39 lbs   SENSATION: Eval:  Light touch intact today, though somewhat diminished due to pain around the IP joint and T2  EDEMA:   Eval:  Mildly swollen in left thumb and hand today  OBSERVATIONS:   Eval: Nail has some discoloration and bruising, his thumb is very stiff and tender to touch, he is not very worried or cautious in his back states using his other digits to carry groceries and bags even yesterday.  He may be a bit impulsive and not completely thinking of safety in terms of his fracture and also right shoulder injury (as seen by his wearing his sling incorrectly his arm was basically not supported at all inside of this shoulder sling.  OT did help and fix this issue.) (Thumb tuft fx)    TODAY'S TREATMENT:  11/09/23: *** Check new left hand strengthening as well as comprehensive right shoulder program, address any questions and concerns or pain and needs.  If he does not return in 4 weeks, discharged assuming all goals have been  met   Shoulder Exercises - Seated Shoulder Flexion Towel Slide at Table Top  - 4 x daily - 3-5 reps - 15 hold - Seated Shoulder Abduction Towel Slide at Table Top  - 3-4 x daily - 3-5 reps - 15 hold - Doorway Stretches  - 4-5 x daily - 3 reps - 15 sec hold - Seated Shoulder External Rotation PROM on Table  - 3-4 x daily - 3-5 reps - 15 sec hold - Sleeper Stretch  - 3-4 x daily - 3-5 reps - 15 hold -  Standing Shoulder Row with Anchored Resistance  - 2-4 x daily - 1-2 sets - 10-15 reps - Fridge Door Stretch  - 4 x daily - 3-5 reps - 15 sec hold  Thumb Exercises/Activities - Seated Composite Thumb Flexion PROM  - 2-3 x daily - 3-5 reps - 15 sec hold - Full Fist  - 2-3 x daily - 5 reps - Thumb Opposition with Putty  - 2-3 x daily - 5 reps  PATIENT EDUCATION: Education details: See tx section above for details  Person educated: Patient Education method: Verbal Instruction, Teach back, Handouts  Education comprehension: States and demonstrates understanding, Additional Education required    HOME EXERCISE PROGRAM: Access Code: [email or web address]  (THUMB) URL: [email or web address] Date: 09/08/2023 Prepared by: Fannie Knee  Access Code: [email or web address]  (SHOULDER) URL: [email or web address] Date: 10/19/2023 Prepared by: Fannie Knee   GOALS: Goals reviewed with patient? Yes   SHORT TERM GOALS: (STG required if POC>30 days) Target Date: 09/08/23  Pt will obtain protective, custom orthotic. Goal status:  MET   2.  Pt will demo/state understanding of initial HEP to improve pain levels and prerequisite motion. Goal status: 10/26/23: MET   LONG TERM GOALS: Target Date: 11/19/23  Pt will improve functional ability by decreased impairment per Quick DASH assessment from 24% to 5% or better, for better quality of life. Goal status: 10/19/23: Now 22% but updated to consider shoulder issues Rt side  2.  Pt will improve grip strength in Lt hand to at least 25lbs  for functional use at home and in IADLs. Goal status: 10/19/23: NT today, as he is not yet 8 weeks post fx (will be addressed next session)  3.  Pt will improve A/ROM in Lt thumb DIP flexion from 0* to at least 30*, to have functional motion for tasks like reach and grasp.  Goal status: 10/19/23: MET, now 48*   4.  Pt will improve coordination skills in Lt hand, as seen by [facility] score on 9HPT testing to have increased functional ability to carry out fine motor tasks (fasteners, etc.) and more complex, coordinated IADLs (meal prep, sports, etc.).  Goal status:  10/19/23: NT today due to time and addressing new shoulder POC (will be addressed)   5. Pt will improve A/ROM in Rt shoulder flex/abd from aprox 115* each to at least 145* each*, to have functional motion for tasks like overhead reach for hair care and washing.   Goal status: 10/19/23: NEW GOAL TODAY  6.  Pt will improve strength in Rt shoulder ext rotation form painful 4-/5 MMT to at least non-painful 4+/5 MMT to improve functional use with home tasks, chores, lawn care, etc.  Goal status: 10/19/23: NEW GOAL TODAY    ASSESSMENT:  CLINICAL IMPRESSION: 11/09/23: ***  10/26/23: He is doing very well and aside from some minor aches and pains in the shoulders and the thumb, he has no significant pain or problems.  He seems to understand his program for both his shoulder and his left thumb, and he just needs to be consistent with this and not hurt himself also break habits that cause him to have poor postures.  He did mention some difficulty with co-pays and he may cancel his remaining appointments if he feels that he understands his current plan of care.  He is welcome to come back as often as he needs though.   PLAN:  OT FREQUENCY: 1-2x/week  OT DURATION: 4 additional weeks through 11/19/23 as needed up to 10 total visits  PLANNED INTERVENTIONS: self care/ADL training, therapeutic exercise, therapeutic activity, neuromuscular  re-education, manual therapy, passive range of motion, splinting, electrical stimulation, ultrasound, fluidotherapy, compression bandaging, moist heat, cryotherapy, contrast bath, patient/family education, and coping strategies training  CONSULTED AND AGREED WITH PLAN OF CARE: Patient  PLAN FOR NEXT SESSION:   ***  Fannie Knee, OTR/L ,CHT 11/08/2023, 5:55 PM

## 2023-11-09 ENCOUNTER — Ambulatory Visit (INDEPENDENT_AMBULATORY_CARE_PROVIDER_SITE_OTHER): Payer: Medicare HMO

## 2023-11-09 ENCOUNTER — Ambulatory Visit: Payer: Medicare HMO | Admitting: Orthopaedic Surgery

## 2023-11-09 ENCOUNTER — Encounter: Payer: Self-pay | Admitting: Orthopaedic Surgery

## 2023-11-09 ENCOUNTER — Encounter: Payer: Medicare HMO | Admitting: Rehabilitative and Restorative Service Providers"

## 2023-11-09 DIAGNOSIS — S42031A Displaced fracture of lateral end of right clavicle, initial encounter for closed fracture: Secondary | ICD-10-CM | POA: Diagnosis not present

## 2023-11-09 DIAGNOSIS — S62525A Nondisplaced fracture of distal phalanx of left thumb, initial encounter for closed fracture: Secondary | ICD-10-CM | POA: Diagnosis not present

## 2023-11-09 NOTE — Progress Notes (Addendum)
   Office Visit Note   Patient: Bryan Mccullough           Date of Birth: 1941/11/24           MRN: [id number] Visit Date: 11/09/2023              Requested by: Donita Brooks, MD [facility] [facility] [address] [place],  [place] [address] PCP: Donita Brooks, MD   Assessment & Plan: Visit Diagnoses:  1. Nondisplaced fracture of distal phalanx of left thumb, initial encounter for closed fracture   2. Traumatic closed fracture of distal clavicle with minimal displacement, right, initial encounter     Plan: Patient is now nearly 3 months from his injuries.  He is doing really well.  I do not think he needs formal therapy anymore.  We can just see him back as needed.  He is released activity as tolerated.  Follow-Up Instructions: Return if symptoms worsen or fail to improve.   Orders:  Orders Placed This Encounter  Procedures   XR Clavicle Right   XR Finger Thumb Left   No orders of the defined types were placed in this encounter.     Procedures: No procedures performed   Clinical Data: No additional findings.   Subjective: Chief Complaint  Patient presents with   Right Shoulder - Follow-up    Clavicle fracture   Left Hand - Follow-up    Thumb fracture    HPI Patient follows up today for follow-up evaluation for right clavicle fracture and left thumb fracture.  He states that overall everything is improving.  His pain is 3 out of 10 at worst. Review of Systems   Objective: Vital Signs: There were no vitals taken for this visit.  Physical Exam  Ortho Exam Exam of the right shoulder and left thumb shows significant improvement in function and range of motion. Specialty Comments:  No specialty comments available.  Imaging: XR Clavicle Right  Result Date: 11/09/2023 X-rays of the right clavicle show ongoing fracture healing.  XR Finger Thumb Left  Result Date: 11/09/2023 X rays of the left thumb show stable distal phalanx fracture without any malalignment.   There is evidence of ongoing fracture healing.    PMFS History: Patient Active Problem List   Diagnosis Date Noted   Diabetes mellitus type 2 in nonobese (HCC)    HLD (hyperlipidemia)    BPH with elevated PSA    Esophageal stricture    Erectile dysfunction    Lumbosacral root lesions, not elsewhere classified 03/03/2013   Past Medical History:  Diagnosis Date   BPH with elevated PSA    Diabetes mellitus type 2 in nonobese (HCC)    Diverticulosis    Erectile dysfunction    Esophageal stricture    GERD (gastroesophageal reflux disease)    Hiatal hernia    HLD (hyperlipidemia)     Family History  Problem Relation Age of Onset   Heart disease Brother    Stroke Brother     Past Surgical History:  Procedure Laterality Date   PROSTATE SURGERY     Social History   Occupational History   Not on file  Tobacco Use   Smoking status: Never   Smokeless tobacco: Never  Substance and Sexual Activity   Alcohol use: No   Drug use: No   Sexual activity: Yes    Comment: married, bus driver

## 2023-11-16 ENCOUNTER — Encounter: Payer: Medicare HMO | Admitting: Rehabilitative and Restorative Service Providers"

## 2023-12-02 ENCOUNTER — Ambulatory Visit: Payer: Medicare HMO | Admitting: Family Medicine

## 2023-12-02 VITALS — BP 132/78 | HR 87 | Temp 98.0°F | Ht 68.0 in | Wt 144.8 lb

## 2023-12-02 DIAGNOSIS — M25511 Pain in right shoulder: Secondary | ICD-10-CM | POA: Diagnosis not present

## 2023-12-02 DIAGNOSIS — G8929 Other chronic pain: Secondary | ICD-10-CM | POA: Diagnosis not present

## 2023-12-02 DIAGNOSIS — M25512 Pain in left shoulder: Secondary | ICD-10-CM

## 2023-12-02 DIAGNOSIS — R350 Frequency of micturition: Secondary | ICD-10-CM | POA: Diagnosis not present

## 2023-12-02 LAB — URINALYSIS, ROUTINE W REFLEX MICROSCOPIC
Bacteria, UA: NONE SEEN /[HPF]
Bilirubin Urine: NEGATIVE
Glucose, UA: NEGATIVE
Hgb urine dipstick: NEGATIVE
Hyaline Cast: NONE SEEN /[LPF]
Ketones, ur: NEGATIVE
Leukocytes,Ua: NEGATIVE
Nitrite: NEGATIVE
RBC / HPF: NONE SEEN /[HPF] (ref 0–2)
Specific Gravity, Urine: 1.025 (ref 1.001–1.035)
Squamous Epithelial / HPF: NONE SEEN /[HPF] (ref <=5)
WBC, UA: NONE SEEN /[HPF] (ref 0–5)
pH: 7 (ref 5.0–8.0)

## 2023-12-02 LAB — MICROSCOPIC MESSAGE

## 2023-12-02 NOTE — Progress Notes (Signed)
Subjective:    Patient ID: Bryan Mccullough, male    DOB: [birth year], 82 y.o.   MRN: [id number]  Patient reports right shoulder pain.  He fractured his right clavicle however that is healed.  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  We discussed that in detail today.  He is currently taking Aleve occasionally for the pain.  He states the pain is not well-controlled.  Strong encouraged patient discussed with orthopedic surgeon correction.  However he does states the pain is not as severe.  He can live with the pain.  He does not want to undergo surgery at this point in his life.  He was questioning how often he can take diclofenac.  He also reports increased urinary frequency.  His urinalysis today shows trace protein but is otherwise normal. Past Medical History:  Diagnosis Date   BPH with elevated PSA    Diabetes mellitus type 2 in nonobese [facility])    Diverticulosis    Erectile dysfunction    Esophageal stricture    GERD (gastroesophageal reflux disease)    Hiatal hernia    HLD (hyperlipidemia)    Past Surgical History:  Procedure Laterality Date   PROSTATE SURGERY     Current Outpatient Medications on File Prior to Visit  Medication Sig Dispense Refill   Blood Glucose Monitoring Suppl DEVI 1 each by Does not apply route in the morning, at noon, and at bedtime. May substitute to any manufacturer covered by patient's insurance. 1 each 0   diclofenac (VOLTAREN) 75 MG EC tablet Take 1 tablet (75 mg total) by mouth 2 (two) times daily. 60 tablet 2   finasteride (PROSCAR) 5 MG tablet Take 5 mg by mouth daily.     HYDROcodone-acetaminophen (NORCO) 5-325 MG tablet Take 1-2 tablets by mouth every 6 (six) hours as needed. 20 tablet 0   metFORMIN (GLUCOPHAGE-XR) 500 MG 24 hr tablet Take 2 tablets (1,000 mg total) by mouth daily with breakfast. 180 tablet 3   naproxen sodium (ALEVE) 220 MG tablet Take 220 mg by mouth.      nystatin (MYCOSTATIN) 100000 UNIT/ML suspension Take 5 mLs (500,000 Units total) by mouth 4 (four) times daily. 60 mL 0   pantoprazole (PROTONIX) 40 MG tablet Take 1 tablet (40 mg total) by mouth daily. 90 tablet 1   pravastatin (PRAVACHOL) 20 MG tablet TAKE 1 TABLET BY MOUTH DAILY 90 tablet 3   senna-docusate (SENOKOT-S) 8.6-50 MG tablet Take 2 tablets by mouth daily. 60 tablet 3   tamsulosin (FLOMAX) 0.4 MG CAPS capsule Take 0.4 mg by mouth daily.     terbinafine (LAMISIL) 250 MG tablet Take 1 tablet (250 mg total) by mouth daily. 30 tablet 1   No current facility-administered medications on file prior to visit.    Allergies  Allergen Reactions   Penicillins    Social History   Socioeconomic History   Marital status: Single    Spouse name: Not on file   Number of children: Not on file   Years of education: Not on file   Highest education level: Not on file  Occupational History   Not on file  Tobacco Use   Smoking status: Never   Smokeless tobacco: Never  Substance and Sexual Activity   Alcohol use: No   Drug use: No   Sexual activity: Yes    Comment: married, bus driver  Other Topics Concern   Not on file  Social History Narrative   Not on file   Social Drivers of Health   Financial Resource Strain: Low Risk  (05/01/2021)   Overall Financial Resource Strain (CARDIA)    Difficulty of Paying Living Expenses: Not hard at all  Food Insecurity: No Food Insecurity (05/01/2021)   Hunger Vital Sign    Worried About Running Out of Food in the Last Year: Never true    Ran Out of Food in the Last Year: Never true  Transportation Needs: No Transportation Needs (05/01/2021)   PRAPARE - Administrator, Civil Service (Medical): No    Lack of Transportation (Non-Medical): No  Physical Activity: Sufficiently Active (05/01/2021)   Exercise Vital Sign    Days of Exercise per Week: 5 days    Minutes of Exercise per Session: 40 min  Stress: No Stress Concern Present  (05/01/2021)   [employer] of Occupational Health - Occupational Stress Questionnaire    Feeling of Stress : Not at all  Social Connections: Moderately Integrated (05/01/2021)   Social Connection and Isolation Panel [NHANES]    Frequency of Communication with Friends and Family: More than three times a week    Frequency of Social Gatherings with Friends and Family: Three times a week    Attends Religious Services: More than 4 times per year    Active Member of Clubs or Organizations: No    Attends Banker Meetings: Never    Marital Status: Married  Catering manager Violence: Not At Risk (05/01/2021)   Humiliation, Afraid, Rape, and Kick questionnaire    Fear of Current or Ex-Partner: No    Emotionally Abused: No    Physically Abused: No    Sexually Abused: No   Family History  Problem Relation Age of Onset   Heart disease Brother    Stroke Brother      Review of Systems  Musculoskeletal:  Positive for back pain.  All other systems reviewed and are negative.      Objective:   Physical Exam Vitals reviewed.  Constitutional:      General: He is not in acute distress.    Appearance: He is well-developed. He is not diaphoretic.  HENT:     Head: Normocephalic and atraumatic.  Neck:     Thyroid: No thyromegaly.  Cardiovascular:     Rate and Rhythm: Normal rate and regular rhythm.     Heart sounds: Normal heart sounds. No murmur heard.    No friction rub. No gallop.  Pulmonary:     Effort: Pulmonary effort is normal. No respiratory distress.     Breath sounds: Normal breath sounds. No wheezing or rales.  Chest:     Chest wall: No tenderness.  Abdominal:     General: Bowel sounds are normal.     Palpations: Abdomen is soft.  Musculoskeletal:     Right shoulder: Tenderness and crepitus present. No effusion. Decreased range of motion. Decreased strength.     Left shoulder: Tenderness and crepitus present. Decreased range of motion. Decreased strength.      Cervical back: Normal range of motion and neck supple. Tenderness present. No bony tenderness.       Back:     Right knee: No swelling or effusion. Normal range of motion. No medial joint line or lateral joint line tenderness.  Skin:    General: Skin is warm.     Coloration: Skin is not pale.     Findings: No erythema or rash.  Neurological:  Motor: No abnormal muscle tone.     Deep Tendon Reflexes: Reflexes are normal and symmetric.          Assessment & Plan:  Urinary frequency - Plan: Urinalysis, Routine w reflex microscopic  Chronic pain of both shoulders Patient has chronic shoulder pain in both shoulders and now right shoulder pain due to a tear in his supraspinatus tendon.  He does not want to undergo surgery.  We spent more than 25 minutes today discussing this.  I recommended that he use NSAIDs for the pain but also periodically take breaks to avoid gastric upset.  Urinalysis is reassuring.  No evidence of UTI.

## 2024-01-18 ENCOUNTER — Encounter: Payer: Self-pay | Admitting: Family Medicine

## 2024-01-18 ENCOUNTER — Ambulatory Visit (INDEPENDENT_AMBULATORY_CARE_PROVIDER_SITE_OTHER): Payer: Medicare HMO | Admitting: Family Medicine

## 2024-01-18 VITALS — BP 142/72 | HR 73 | Temp 98.0°F | Ht 68.0 in | Wt 146.8 lb

## 2024-01-18 DIAGNOSIS — R51 Headache with orthostatic component, not elsewhere classified: Secondary | ICD-10-CM

## 2024-01-18 MED ORDER — CEFDINIR 300 MG PO CAPS: 300.0000 mg | ORAL_CAPSULE | Freq: Two times a day (BID) | ORAL | 0 refills | Status: DC

## 2024-01-18 NOTE — Progress Notes (Signed)
Subjective:    Patient ID: Bryan Mccullough, male    DOB: Apr 25, 1941, 83 y.o.   MRN: [id number] 12/02/23 Patient reports right shoulder pain.  He fractured his right clavicle however that is healed.  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  We discussed that in detail today.  He is currently taking Aleve occasionally for the pain.  He states the pain is not well-controlled.  Strong encouraged patient discussed with orthopedic surgeon correction.  However he does states the pain is not as severe.  He can live with the pain.  He does not want to undergo surgery at this point in his life.  He was questioning how often he can take diclofenac.  He also reports increased urinary frequency.  His urinalysis today shows trace protein but is otherwise normal.  At that time, my plan was: Patient has chronic shoulder pain in both shoulders and now right shoulder pain due to a tear in his supraspinatus tendon.  He does not want to undergo surgery.  We spent more than 25 minutes today discussing this.  I recommended that he use NSAIDs for the pain but also periodically take breaks to avoid gastric upset.  Urinalysis is reassuring.  No evidence of UTI.  01/18/24 Patient reports a headache in the front of his head and on the crown of his head for 2 to 3 weeks.  He states that it hurts when he leans over.  This makes the pain worse.  He also has pain in his left maxilla and.  Nasal mucus that he blows out every morning.  He denies any fever or chills.  He denies any light sensitivity.  He denies any meningismus.  He denies any pulsatile headache, photophobia, phonophobia, or blurry vision.  He denies any jaw claudication.  He has been taking Aleve and this seems to help with the headache Past Medical History:  Diagnosis Date   BPH with elevated PSA    Diabetes mellitus type 2 in nonobese [facility])    Diverticulosis    Erectile dysfunction     Esophageal stricture    GERD (gastroesophageal reflux disease)    Hiatal hernia    HLD (hyperlipidemia)    Past Surgical History:  Procedure Laterality Date   PROSTATE SURGERY     Current Outpatient Medications on File Prior to Visit  Medication Sig Dispense Refill   Blood Glucose Monitoring Suppl DEVI 1 each by Does not apply route in the morning, at noon, and at bedtime. May substitute to any manufacturer covered by patient's insurance. 1 each 0   diclofenac (VOLTAREN) 75 MG EC tablet Take 1 tablet (75 mg total) by mouth 2 (two) times daily. 60 tablet 2   finasteride (PROSCAR) 5 MG tablet Take 5 mg by mouth daily.     HYDROcodone-acetaminophen (NORCO) 5-325 MG tablet Take 1-2 tablets by mouth every 6 (six) hours as needed. 20 tablet 0   metFORMIN (GLUCOPHAGE-XR) 500 MG 24 hr tablet Take 2 tablets (1,000 mg total) by mouth daily with breakfast. 180 tablet 3   naproxen sodium (ALEVE) 220 MG tablet Take 220 mg by mouth.     nystatin (MYCOSTATIN) 100000 UNIT/ML suspension Take 5 mLs (500,000 Units total) by mouth 4 (four) times daily. 60 mL 0   pantoprazole (PROTONIX) 40 MG tablet Take 1 tablet (40 mg total) by mouth daily. 90 tablet 1   pravastatin (PRAVACHOL) 20 MG tablet TAKE 1 TABLET  BY MOUTH DAILY 90 tablet 3   senna-docusate (SENOKOT-S) 8.6-50 MG tablet Take 2 tablets by mouth daily. 60 tablet 3   tamsulosin (FLOMAX) 0.4 MG CAPS capsule Take 0.4 mg by mouth daily.     terbinafine (LAMISIL) 250 MG tablet Take 1 tablet (250 mg total) by mouth daily. 30 tablet 1   No current facility-administered medications on file prior to visit.    Allergies  Allergen Reactions   Penicillins    Social History   Socioeconomic History   Marital status: Single    Spouse name: Not on file   Number of children: Not on file   Years of education: Not on file   Highest education level: Not on file  Occupational History   Not on file  Tobacco Use   Smoking status: Never   Smokeless tobacco:  Never  Substance and Sexual Activity   Alcohol use: No   Drug use: No   Sexual activity: Yes    Comment: married, bus driver  Other Topics Concern   Not on file  Social History Narrative   Not on file   Social Drivers of Health   Financial Resource Strain: Low Risk  (05/01/2021)   Overall Financial Resource Strain (CARDIA)    Difficulty of Paying Living Expenses: Not hard at all  Food Insecurity: No Food Insecurity (05/01/2021)   Hunger Vital Sign    Worried About Running Out of Food in the Last Year: Never true    Ran Out of Food in the Last Year: Never true  Transportation Needs: No Transportation Needs (05/01/2021)   PRAPARE - Administrator, Civil Service (Medical): No    Lack of Transportation (Non-Medical): No  Physical Activity: Sufficiently Active (05/01/2021)   Exercise Vital Sign    Days of Exercise per Week: 5 days    Minutes of Exercise per Session: 40 min  Stress: No Stress Concern Present (05/01/2021)   [employer] of Occupational Health - Occupational Stress Questionnaire    Feeling of Stress : Not at all  Social Connections: Moderately Integrated (05/01/2021)   Social Connection and Isolation Panel [NHANES]    Frequency of Communication with Friends and Family: More than three times a week    Frequency of Social Gatherings with Friends and Family: Three times a week    Attends Religious Services: More than 4 times per year    Active Member of Clubs or Organizations: No    Attends Banker Meetings: Never    Marital Status: Married  Catering manager Violence: Not At Risk (05/01/2021)   Humiliation, Afraid, Rape, and Kick questionnaire    Fear of Current or Ex-Partner: No    Emotionally Abused: No    Physically Abused: No    Sexually Abused: No   Family History  Problem Relation Age of Onset   Heart disease Brother    Stroke Brother      Review of Systems  Musculoskeletal:  Positive for back pain.  All other systems reviewed  and are negative.      Objective:   Physical Exam Vitals reviewed.  Constitutional:      General: He is not in acute distress.    Appearance: He is well-developed. He is not diaphoretic.  HENT:     Head: Normocephalic and atraumatic.     Nose: Congestion present. No nasal deformity, nasal tenderness or rhinorrhea.     Right Sinus: No maxillary sinus tenderness or frontal sinus tenderness.  Left Sinus: No maxillary sinus tenderness or frontal sinus tenderness.  Eyes:     General: Lids are normal.     Extraocular Movements: Extraocular movements intact.     Pupils: Pupils are equal, round, and reactive to light.  Neck:     Thyroid: No thyromegaly.  Cardiovascular:     Rate and Rhythm: Normal rate and regular rhythm.     Heart sounds: Normal heart sounds. No murmur heard.    No friction rub. No gallop.  Pulmonary:     Effort: Pulmonary effort is normal. No respiratory distress.     Breath sounds: Normal breath sounds. No wheezing or rales.  Chest:     Chest wall: No tenderness.  Abdominal:     General: Bowel sounds are normal.     Palpations: Abdomen is soft.  Musculoskeletal:     Cervical back: Normal range of motion and neck supple. Tenderness present. No bony tenderness.       Back:     Right knee: No swelling or effusion. Normal range of motion. No medial joint line or lateral joint line tenderness.  Skin:    General: Skin is warm.     Coloration: Skin is not pale.     Findings: No erythema or rash.  Neurological:     Motor: No abnormal muscle tone.     Deep Tendon Reflexes: Reflexes are normal and symmetric.          Assessment & Plan:   Positional headache The fact his head hurts when he is leaning foward suggest a sinus infection versus dehydration versus cerebrospinal fluid leak.  Based on his other symptoms, I suspect a sinus infection.  Try Omnicef 300 mg p.o. twice daily for 10 days.  Drink more water until urine is light yellow.

## 2024-01-26 ENCOUNTER — Other Ambulatory Visit: Payer: Self-pay | Admitting: Urology

## 2024-01-26 DIAGNOSIS — R972 Elevated prostate specific antigen [PSA]: Secondary | ICD-10-CM

## 2024-02-28 DIAGNOSIS — Z8739 Personal history of other diseases of the musculoskeletal system and connective tissue: Secondary | ICD-10-CM | POA: Diagnosis not present

## 2024-02-28 DIAGNOSIS — M25512 Pain in left shoulder: Secondary | ICD-10-CM | POA: Diagnosis not present

## 2024-02-28 DIAGNOSIS — G8929 Other chronic pain: Secondary | ICD-10-CM | POA: Diagnosis not present

## 2024-02-28 DIAGNOSIS — M25511 Pain in right shoulder: Secondary | ICD-10-CM | POA: Diagnosis not present

## 2024-03-03 ENCOUNTER — Ambulatory Visit (INDEPENDENT_AMBULATORY_CARE_PROVIDER_SITE_OTHER): Admitting: Family Medicine

## 2024-03-03 ENCOUNTER — Encounter: Payer: Self-pay | Admitting: Family Medicine

## 2024-03-03 VITALS — BP 130/78 | HR 97 | Temp 98.1°F | Ht 68.0 in | Wt 148.0 lb

## 2024-03-03 DIAGNOSIS — M25512 Pain in left shoulder: Secondary | ICD-10-CM | POA: Diagnosis not present

## 2024-03-03 DIAGNOSIS — R200 Anesthesia of skin: Secondary | ICD-10-CM

## 2024-03-03 DIAGNOSIS — M79642 Pain in left hand: Secondary | ICD-10-CM | POA: Diagnosis not present

## 2024-03-03 DIAGNOSIS — R202 Paresthesia of skin: Secondary | ICD-10-CM | POA: Diagnosis not present

## 2024-03-03 DIAGNOSIS — M79641 Pain in right hand: Secondary | ICD-10-CM

## 2024-03-03 DIAGNOSIS — R739 Hyperglycemia, unspecified: Secondary | ICD-10-CM | POA: Diagnosis not present

## 2024-03-03 DIAGNOSIS — M25511 Pain in right shoulder: Secondary | ICD-10-CM

## 2024-03-03 DIAGNOSIS — G8929 Other chronic pain: Secondary | ICD-10-CM

## 2024-03-03 MED ORDER — HYDROCODONE-ACETAMINOPHEN 5-325 MG PO TABS: 1.0000 | ORAL_TABLET | Freq: Four times a day (QID) | ORAL | 0 refills | Status: AC | PRN

## 2024-03-03 NOTE — Progress Notes (Signed)
 Subjective:    Patient ID: Bryan Mccullough, male    DOB: 28-Jul-1941, 83 y.o.   MRN: [id number] 02/18/23 Patient continues to endorse pain in both shoulders.  He is not taking diclofenac regularly.  He states that he forgets to take it.  His range of motion in his left shoulder is quite good however he has pain with abduction greater than 100 degrees in his right shoulder.  He also complains of pain with external rotation.  He states that it hurts more at night.  He states that has been bothering him more after he fell.  He was cutting a branch with a saw.  He fell and he jerked his arm backward and external rotation as he fell.  Since that time he has had more persistent pain.  At that time, my plan was: He is complaining of shoulder pain for quite some time however this is more likely to be rotator cuff particular given the mechanism of action.  Using sterile technique, I injected the right subacromial space with 2 cc of lidocaine, 2 cc of Marcaine, and 2 cc of 40 mg/mL Kenalog.  He tolerated the procedure well without complication.  I will repeat his A1c which was 6.4 in November.  He does not want to take the metformin twice a day therefore I will transition him to metformin extended release 1000 mg p.o. every morning to provide 24-hour coverage  06/21/23 Patient saw very little benefit from the cortisone injection that I gave him in his right shoulder.  He has been dealing with this shoulder pain for years.  We obtained x-rays in 2021 that showed no significant joint pathology.  However now he is reporting pain in both shoulders left greater than right with abduction greater then 90 degrees.  He states that he can barely lift his arms to put his shirt on.  Here today he has noticeable weakness with empty can testing.  Abduction is limited to 3/5 in the right shoulder.  Left shoulder his strength is better.  Abduction of the left shoulder is 4 out of 5.  However empty can testing elicits pain in both  shoulders.  Hawking's maneuver elicits pain in both shoulders.  He has normal reflexes checked at the biceps and brachioradialis.  He has normal grip strength.  Bicep strength is normal.  He has negative Spurling sign.  He has no numbness or tingling in either arm.  At that time, my plan was: Patient has been dealing with pain in both shoulders now for more than 3 years.  We have tried cortisone injections.  We have tried NSAIDs including meloxicam and diclofenac without benefit.  Patient is currently taking Aleve without benefit.  At this point, I believe we need to proceed with an MRI of the shoulder to determine the extent of the damage to the rotator cuff.  X-rays were unremarkable.  Mri: IMPRESSION: 1. Severe tendinosis of the supraspinatus tendon with a complete tear 15 mm from the peripheral insertion and with 12 mm of retraction. 2. Severe tendinosis of the infraspinatus tendon. 3. Partial-thickness articular surface tear of the subscapularis tendon. 4. Moderate tendinosis of the intra-articular portion of the long head of the biceps tendon with a high-grade partial-thickness tear. 5. Nondisplaced fracture of the distal clavicle with surrounding bone marrow edema.   03/03/24 Due to the severe tendinosis and tears I recommended an orthopedic consultation.  He saw Dr. August Saucer in October but the patient not want surgery.  He tried physical  therapy but saw very little benefit with regards to his shoulder pain.  Despite taking NSAIDs and Tylenol, he continues to have daily pain in both shoulders.  The left shoulder is now hurting as much as the right shoulder.  We discussed repeat an MRI of the left shoulder to determine if he has a tear in that muscle however he does not want surgery due to his age and therefore the choice would still be pain control.  Unfortunately NSAIDs are not controlling the pain.  He also reports numbness and tingling and burning in both hands from his wrist to his  fingertips.  This just started a few months ago.  He also complains of pain in the MCP PIP and DIP joints.  Past Medical History:  Diagnosis Date   BPH with elevated PSA    Diabetes mellitus type 2 in nonobese [facility])    Diverticulosis    Erectile dysfunction    Esophageal stricture    GERD (gastroesophageal reflux disease)    Hiatal hernia    HLD (hyperlipidemia)    Past Surgical History:  Procedure Laterality Date   PROSTATE SURGERY     Current Outpatient Medications on File Prior to Visit  Medication Sig Dispense Refill   Blood Glucose Monitoring Suppl DEVI 1 each by Does not apply route in the morning, at noon, and at bedtime. May substitute to any manufacturer covered by patient's insurance. 1 each 0   finasteride (PROSCAR) 5 MG tablet Take 5 mg by mouth daily.     metFORMIN (GLUCOPHAGE-XR) 500 MG 24 hr tablet Take 2 tablets (1,000 mg total) by mouth daily with breakfast. 180 tablet 3   naproxen sodium (ALEVE) 220 MG tablet Take 220 mg by mouth.     nystatin (MYCOSTATIN) 100000 UNIT/ML suspension Take 5 mLs (500,000 Units total) by mouth 4 (four) times daily. 60 mL 0   pantoprazole (PROTONIX) 40 MG tablet Take 1 tablet (40 mg total) by mouth daily. 90 tablet 1   pravastatin (PRAVACHOL) 20 MG tablet TAKE 1 TABLET BY MOUTH DAILY 90 tablet 3   senna-docusate (SENOKOT-S) 8.6-50 MG tablet Take 2 tablets by mouth daily. 60 tablet 3   tamsulosin (FLOMAX) 0.4 MG CAPS capsule Take 0.4 mg by mouth daily.     terbinafine (LAMISIL) 250 MG tablet Take 1 tablet (250 mg total) by mouth daily. 30 tablet 1   cefdinir (OMNICEF) 300 MG capsule Take 1 capsule (300 mg total) by mouth 2 (two) times daily. (Patient not taking: Reported on 03/03/2024) 20 capsule 0   diclofenac (VOLTAREN) 75 MG EC tablet Take 1 tablet (75 mg total) by mouth 2 (two) times daily. (Patient not taking: Reported on 03/03/2024) 60 tablet 2   HYDROcodone-acetaminophen (NORCO) 5-325 MG tablet Take 1-2 tablets by mouth every 6 (six)  hours as needed. (Patient not taking: Reported on 03/03/2024) 20 tablet 0   No current facility-administered medications on file prior to visit.    Allergies  Allergen Reactions   Penicillins    Social History   Socioeconomic History   Marital status: Single    Spouse name: Not on file   Number of children: Not on file   Years of education: Not on file   Highest education level: Not on file  Occupational History   Not on file  Tobacco Use   Smoking status: Never   Smokeless tobacco: Never  Substance and Sexual Activity   Alcohol use: No   Drug use: No   Sexual activity: Yes  Comment: married, bus driver  Other Topics Concern   Not on file  Social History Narrative   Not on file   Social Drivers of Health   Financial Resource Strain: Low Risk  (05/01/2021)   Overall Financial Resource Strain (CARDIA)    Difficulty of Paying Living Expenses: Not hard at all  Food Insecurity: No Food Insecurity (05/01/2021)   Hunger Vital Sign    Worried About Running Out of Food in the Last Year: Never true    Ran Out of Food in the Last Year: Never true  Transportation Needs: No Transportation Needs (05/01/2021)   PRAPARE - Administrator, Civil Service (Medical): No    Lack of Transportation (Non-Medical): No  Physical Activity: Sufficiently Active (05/01/2021)   Exercise Vital Sign    Days of Exercise per Week: 5 days    Minutes of Exercise per Session: 40 min  Stress: No Stress Concern Present (05/01/2021)   [employer] of Occupational Health - Occupational Stress Questionnaire    Feeling of Stress : Not at all  Social Connections: Moderately Integrated (05/01/2021)   Social Connection and Isolation Panel [NHANES]    Frequency of Communication with Friends and Family: More than three times a week    Frequency of Social Gatherings with Friends and Family: Three times a week    Attends Religious Services: More than 4 times per year    Active Member of Clubs or  Organizations: No    Attends Banker Meetings: Never    Marital Status: Married  Catering manager Violence: Not At Risk (05/01/2021)   Humiliation, Afraid, Rape, and Kick questionnaire    Fear of Current or Ex-Partner: No    Emotionally Abused: No    Physically Abused: No    Sexually Abused: No   Family History  Problem Relation Age of Onset   Heart disease Brother    Stroke Brother      Review of Systems  Musculoskeletal:  Positive for back pain.  All other systems reviewed and are negative.      Objective:   Physical Exam Vitals reviewed.  Constitutional:      General: He is not in acute distress.    Appearance: He is well-developed. He is not diaphoretic.  HENT:     Head: Normocephalic and atraumatic.  Neck:     Thyroid: No thyromegaly.  Cardiovascular:     Rate and Rhythm: Normal rate and regular rhythm.     Heart sounds: Normal heart sounds. No murmur heard.    No friction rub. No gallop.  Pulmonary:     Effort: Pulmonary effort is normal. No respiratory distress.     Breath sounds: Normal breath sounds. No wheezing or rales.  Chest:     Chest wall: No tenderness.  Abdominal:     General: Bowel sounds are normal.     Palpations: Abdomen is soft.  Musculoskeletal:     Right shoulder: Tenderness and crepitus present. No effusion. Decreased range of motion. Decreased strength.     Left shoulder: Tenderness and crepitus present. Decreased range of motion. Decreased strength.     Right hand: Tenderness and bony tenderness present. Normal strength. Normal sensation.     Left hand: Tenderness and bony tenderness present. Normal strength. Normal sensation.     Cervical back: Normal range of motion and neck supple. No bony tenderness.     Right knee: No swelling or effusion. Normal range of motion. No medial joint line or lateral  joint line tenderness.  Skin:    General: Skin is warm.     Coloration: Skin is not pale.     Findings: No erythema or rash.   Neurological:     Motor: No abnormal muscle tone.     Deep Tendon Reflexes: Reflexes are normal and symmetric.          Assessment & Plan:  Pain in both hands - Plan: CBC with Differential/Platelet, COMPLETE METABOLIC PANEL WITHOUT GFR, Sedimentation rate, Rheumatoid factor  Numbness and tingling in both hands  Chronic pain of both shoulders I offered the patient a referral back to orthopedics to discuss surgical correction of the tears in the tendons of his shoulder.  He declines any surgery.  Therefore I will not order an MRI of the left shoulder because if the patient had tears he would not want surgery.  Instead we will focus on pain control.  He can use NSAIDs or Tylenol for pain on a daily basis.  He can use hydrocodone 5/325 1 p.o. nightly at night to help him sleep.  I will give him 30 tablets to last a month.  The pain in his hands appears to be new.  I suspect that this is osteoarthritis.  However I will check a sedimentation rate and a rheumatoid factor to evaluate for possible autoimmune disease.  The numbness and tingling is suspicious for "tunnels around the will consult neurology for nerve conduction studies to confirm my suspicion

## 2024-03-08 LAB — CBC WITH DIFFERENTIAL/PLATELET
Absolute Lymphocytes: 1663 {cells}/uL (ref 850–3900)
Absolute Monocytes: 588 {cells}/uL (ref 200–950)
Basophils Absolute: 42 {cells}/uL (ref 0–200)
Basophils Relative: 0.5 %
Eosinophils Absolute: 101 {cells}/uL (ref 15–500)
Eosinophils Relative: 1.2 %
HCT: 44.3 % (ref 38.5–50.0)
Hemoglobin: 15 g/dL (ref 13.2–17.1)
MCH: 29.2 pg (ref 27.0–33.0)
MCHC: 33.9 g/dL (ref 32.0–36.0)
MCV: 86.4 fL (ref 80.0–100.0)
MPV: 9.6 fL (ref 7.5–12.5)
Monocytes Relative: 7 %
Neutro Abs: 6006 {cells}/uL (ref 1500–7800)
Neutrophils Relative %: 71.5 %
Platelets: 268 10*3/uL (ref 140–400)
RBC: 5.13 10*6/uL (ref 4.20–5.80)
RDW: 12.7 % (ref 11.0–15.0)
Total Lymphocyte: 19.8 %
WBC: 8.4 10*3/uL (ref 3.8–10.8)

## 2024-03-08 LAB — HEMOGLOBIN A1C
Hgb A1c MFr Bld: 6.8 %{Hb} — ABNORMAL HIGH (ref <5.7)
Mean Plasma Glucose: 148 mg/dL
eAG (mmol/L): 8.2 mmol/L

## 2024-03-08 LAB — COMPLETE METABOLIC PANEL WITHOUT GFR
AG Ratio: 1.3 (calc) (ref 1.0–2.5)
ALT: 9 U/L (ref 9–46)
AST: 15 U/L (ref 10–35)
Albumin: 3.9 g/dL (ref 3.6–5.1)
Alkaline phosphatase (APISO): 90 U/L (ref 35–144)
BUN: 22 mg/dL (ref 7–25)
CO2: 22 mmol/L (ref 20–32)
Calcium: 9.2 mg/dL (ref 8.6–10.3)
Chloride: 103 mmol/L (ref 98–110)
Creat: 0.87 mg/dL (ref 0.70–1.22)
Globulin: 3 g/dL (ref 1.9–3.7)
Glucose, Bld: 252 mg/dL — ABNORMAL HIGH (ref 65–99)
Potassium: 3.9 mmol/L (ref 3.5–5.3)
Sodium: 138 mmol/L (ref 135–146)
Total Bilirubin: 0.5 mg/dL (ref 0.2–1.2)
Total Protein: 6.9 g/dL (ref 6.1–8.1)

## 2024-03-08 LAB — TEST AUTHORIZATION 2

## 2024-03-08 LAB — SEDIMENTATION RATE: Sed Rate: 11 mm/h (ref 0–20)

## 2024-03-08 LAB — RHEUMATOID FACTOR: Rheumatoid fact SerPl-aCnc: <10 [IU]/mL (ref <14)

## 2024-03-17 DIAGNOSIS — M9905 Segmental and somatic dysfunction of pelvic region: Secondary | ICD-10-CM | POA: Diagnosis not present

## 2024-03-17 DIAGNOSIS — M9902 Segmental and somatic dysfunction of thoracic region: Secondary | ICD-10-CM | POA: Diagnosis not present

## 2024-03-17 DIAGNOSIS — M9903 Segmental and somatic dysfunction of lumbar region: Secondary | ICD-10-CM | POA: Diagnosis not present

## 2024-03-17 DIAGNOSIS — M9901 Segmental and somatic dysfunction of cervical region: Secondary | ICD-10-CM | POA: Diagnosis not present

## 2024-03-23 ENCOUNTER — Ambulatory Visit: Payer: Self-pay

## 2024-03-23 NOTE — Telephone Encounter (Signed)
  Chief Complaint: arm pain Symptoms: bilateral arm pain Frequency: x 3 weeks Pertinent Negatives: Patient denies worsening Disposition: [] ED /[] Urgent Care (no appt availability in office) / [x] Appointment(In office/virtual)/ []  [place] [facility]/ [] Home Care/ [] Refused Recommended Disposition /[] [facility]/ []  Follow-up with PCP Additional Notes: pt states arm pain for the last 3 weeks. States pain is about the same but the medication is not helping. Patient states that he has numbness in his left thumb but that was there at his appt as well. Patient denies any changes. States that he has been taking some diclofenac he had at home.   Copied from CRM [id number]. Topic: Clinical - Red Word Triage >> Mar 23, 2024  2:01 PM Antwanette L wrote: Red Word that prompted transfer to Nurse Triage: Patient is having severe pain in both arms and hands. Patient was last seen on 3/28 Reason for Disposition  [1] MILD pain (e.g., does not interfere with normal activities) AND [2] present > 7 days  Answer Assessment - Initial Assessment Questions 1. ONSET: "When did the pain start?"     3 weeks ago 2. LOCATION: "Where is the pain located?"     Bilateral arms 3. PAIN: "How bad is the pain?" (Scale 1-10; or mild, moderate, severe)   - MILD (1-3): Doesn't interfere with normal activities.   - MODERATE (4-7): Interferes with normal activities (e.g., work or school) or awakens from sleep.   - SEVERE (8-10): Excruciating pain, unable to do any normal activities, unable to hold a cup of water.     Hard to explain 5. CAUSE: "What do you think is causing the arm pain?"     Arthritis  6. OTHER SYMPTOMS: "Do you have any other symptoms?" (e.g., neck pain, swelling, rash, fever, numbness, weakness)     Numbness in left thumb  Protocols used: Arm Pain-A-AH

## 2024-03-27 ENCOUNTER — Ambulatory Visit: Admitting: Family Medicine

## 2024-03-27 ENCOUNTER — Encounter: Payer: Self-pay | Admitting: Family Medicine

## 2024-03-27 VITALS — BP 132/68 | HR 93 | Temp 98.2°F | Ht 68.0 in | Wt 141.2 lb

## 2024-03-27 DIAGNOSIS — M79642 Pain in left hand: Secondary | ICD-10-CM

## 2024-03-27 DIAGNOSIS — M79641 Pain in right hand: Secondary | ICD-10-CM | POA: Diagnosis not present

## 2024-03-27 DIAGNOSIS — M25511 Pain in right shoulder: Secondary | ICD-10-CM

## 2024-03-27 DIAGNOSIS — M25512 Pain in left shoulder: Secondary | ICD-10-CM

## 2024-03-27 DIAGNOSIS — G8929 Other chronic pain: Secondary | ICD-10-CM

## 2024-03-27 MED ORDER — PREDNISONE 20 MG PO TABS: ORAL_TABLET | ORAL | 0 refills | Status: DC

## 2024-03-27 NOTE — Progress Notes (Signed)
 Subjective:    Patient ID: Bryan Mccullough, male    DOB: 07/17/1941, 83 y.o.   MRN: [id number] 02/18/23 Patient continues to endorse pain in both shoulders.  He is not taking diclofenac  regularly.  He states that he forgets to take it.  His range of motion in his left shoulder is quite good however he has pain with abduction greater than 100 degrees in his right shoulder.  He also complains of pain with external rotation.  He states that it hurts more at night.  He states that has been bothering him more after he fell.  He was cutting a branch with a saw.  He fell and he jerked his arm backward and external rotation as he fell.  Since that time he has had more persistent pain.  At that time, my plan was: He is complaining of shoulder pain for quite some time however this is more likely to be rotator cuff particular given the mechanism of action.  Using sterile technique, I injected the right subacromial space with 2 cc of lidocaine , 2 cc of Marcaine , and 2 cc of 40 mg/mL Kenalog .  He tolerated the procedure well without complication.  I will repeat his A1c which was 6.4 in November.  He does not want to take the metformin  twice a day therefore I will transition him to metformin  extended release 1000 mg p.o. every morning to provide 24-hour coverage  06/21/23 Patient saw very little benefit from the cortisone injection that I gave him in his right shoulder.  He has been dealing with this shoulder pain for years.  We obtained x-rays in 2021 that showed no significant joint pathology.  However now he is reporting pain in both shoulders left greater than right with abduction greater then 90 degrees.  He states that he can barely lift his arms to put his shirt on.  Here today he has noticeable weakness with empty can testing.  Abduction is limited to 3/5 in the right shoulder.  Left shoulder his strength is better.  Abduction of the left shoulder is 4 out of 5.  However empty can testing elicits pain in both  shoulders.  Hawking's maneuver elicits pain in both shoulders.  He has normal reflexes checked at the biceps and brachioradialis.  He has normal grip strength.  Bicep strength is normal.  He has negative Spurling sign.  He has no numbness or tingling in either arm.  At that time, my plan was: Patient has been dealing with pain in both shoulders now for more than 3 years.  We have tried cortisone injections.  We have tried NSAIDs including meloxicam  and diclofenac  without benefit.  Patient is currently taking Aleve without benefit.  At this point, I believe we need to proceed with an MRI of the shoulder to determine the extent of the damage to the rotator cuff.  X-rays were unremarkable.  Mri: IMPRESSION: 1. Severe tendinosis of the supraspinatus tendon with a complete tear 15 mm from the peripheral insertion and with 12 mm of retraction. 2. Severe tendinosis of the infraspinatus tendon. 3. Partial-thickness articular surface tear of the subscapularis tendon. 4. Moderate tendinosis of the intra-articular portion of the long head of the biceps tendon with a high-grade partial-thickness tear. 5. Nondisplaced fracture of the distal clavicle with surrounding bone marrow edema.   03/03/24 Due to the severe tendinosis and tears I recommended an orthopedic consultation.  He saw Dr. Rozelle Corning in October but the patient not want surgery.  He tried physical  therapy but saw very little benefit with regards to his shoulder pain.  Despite taking NSAIDs and Tylenol , he continues to have daily pain in both shoulders.  The left shoulder is now hurting as much as the right shoulder.  We discussed repeat an MRI of the left shoulder to determine if he has a tear in that muscle however he does not want surgery due to his age and therefore the choice would still be pain control.  Unfortunately NSAIDs are not controlling the pain.  He also reports numbness and tingling and burning in both hands from his wrist to his  fingertips.  This just started a few months ago.  He also complains of pain in the MCP PIP and DIP joints.  At that time, my plan was: I offered the patient a referral back to orthopedics to discuss surgical correction of the tears in the tendons of his shoulder.  He declines any surgery.  Therefore I will not order an MRI of the left shoulder because if the patient had tears he would not want surgery.  Instead we will focus on pain control.  He can use NSAIDs or Tylenol  for pain on a daily basis.  He can use hydrocodone  5/325 1 p.o. nightly at night to help him sleep.  I will give him 30 tablets to last a month.  The pain in his hands appears to be new.  I suspect that this is osteoarthritis.  However I will check a sedimentation rate and a rheumatoid factor to evaluate for possible autoimmune disease.  The numbness and tingling is suspicious for carpal tunnel syndrome.  i will consult neurology for nerve conduction studies to confirm my suspicion.  03/27/24  Patient states that he was unable to take the hydrocodone .  He felt like the medication was too strong.  Patient with pain shoulder.  We again discussed the previous MRI results.  He now would like to see the orthopedist to discuss possible surgical correction given that the diclofenac  and hydrocodone  are not beneficial.  He continues to report pain in both hands.  He reports numbness and tingling in his fingertips.  He reports shooting pain radiating from his wrist to his fingertips.  He has a positive Tinel sign today.  He has a positive Phalen sign.  He has normal reflexes checked with the brachioradialis and the bicep.  He has decreased grip strength in both hands however his effort was very poor due to pain and perceived tightness in his MCP and PIP joints on both hands.  Rheumatoid factor test and sed rate were negative.  Patient states that someone called him about the nerve conduction study but he was unable to return the call Past Medical  History:  Diagnosis Date   BPH with elevated PSA    Diabetes mellitus type 2 in nonobese [facility])    Diverticulosis    Erectile dysfunction    Esophageal stricture    GERD (gastroesophageal reflux disease)    Hiatal hernia    HLD (hyperlipidemia)    Past Surgical History:  Procedure Laterality Date   PROSTATE SURGERY     Current Outpatient Medications on File Prior to Visit  Medication Sig Dispense Refill   acetaminophen  (TYLENOL ) 325 MG tablet Take 325 mg by mouth every 6 (six) hours as needed for mild pain (pain score 1-3).     Blood Glucose Monitoring Suppl DEVI 1 each by Does not apply route in the morning, at noon, and at bedtime. May substitute to  any manufacturer covered by [employer]. 1 each 0   cefdinir  (OMNICEF ) 300 MG capsule Take 1 capsule (300 mg total) by mouth 2 (two) times daily. (Patient not taking: Reported on 03/03/2024) 20 capsule 0   diclofenac  (VOLTAREN ) 75 MG EC tablet Take 1 tablet (75 mg total) by mouth 2 (two) times daily. (Patient not taking: Reported on 03/03/2024) 60 tablet 2   finasteride  (PROSCAR ) 5 MG tablet Take 5 mg by mouth daily.     HYDROcodone -acetaminophen  (NORCO) 5-325 MG tablet Take 1-2 tablets by mouth every 6 (six) hours as needed. (Patient not taking: Reported on 03/03/2024) 20 tablet 0   HYDROcodone -acetaminophen  (NORCO/VICODIN) 5-325 MG tablet Take 1 tablet by mouth every 6 (six) hours as needed for moderate pain (pain score 4-6). 30 tablet 0   metFORMIN  (GLUCOPHAGE -XR) 500 MG 24 hr tablet Take 2 tablets (1,000 mg total) by mouth daily with breakfast. 180 tablet 3   naproxen sodium (ALEVE) 220 MG tablet Take 220 mg by mouth.     nystatin  (MYCOSTATIN ) 100000 UNIT/ML suspension Take 5 mLs (500,000 Units total) by mouth 4 (four) times daily. (Patient not taking: Reported on 03/03/2024) 60 mL 0   pantoprazole  (PROTONIX ) 40 MG tablet Take 1 tablet (40 mg total) by mouth daily. (Patient not taking: Reported on 03/03/2024) 90 tablet 1   pravastatin   (PRAVACHOL ) 20 MG tablet TAKE 1 TABLET BY MOUTH DAILY 90 tablet 3   senna-docusate (SENOKOT-S) 8.6-50 MG tablet Take 2 tablets by mouth daily. 60 tablet 3   tamsulosin (FLOMAX) 0.4 MG CAPS capsule Take 0.4 mg by mouth daily.     terbinafine  (LAMISIL ) 250 MG tablet Take 1 tablet (250 mg total) by mouth daily. (Patient not taking: Reported on 03/03/2024) 30 tablet 1   No current facility-administered medications on file prior to visit.    Allergies  Allergen Reactions   Penicillins    Social History   Socioeconomic History   Marital status: Single    Spouse name: Not on file   Number of children: Not on file   Years of education: Not on file   Highest education level: Not on file  Occupational History   Not on file  Tobacco Use   Smoking status: Never   Smokeless tobacco: Never  Substance and Sexual Activity   Alcohol use: No   Drug use: No   Sexual activity: Yes    Comment: married, bus driver  Other Topics Concern   Not on file  Social History Narrative   Not on file   Social Drivers of Health   Financial Resource Strain: Low Risk  (05/01/2021)   Overall Financial Resource Strain (CARDIA)    Difficulty of Paying Living Expenses: Not hard at all  Food Insecurity: No Food Insecurity (05/01/2021)   Hunger Vital Sign    Worried About Running Out of Food in the Last Year: Never true    Ran Out of Food in the Last Year: Never true  Transportation Needs: No Transportation Needs (05/01/2021)   PRAPARE - Administrator, Civil Service (Medical): No    Lack of Transportation (Non-Medical): No  Physical Activity: Sufficiently Active (05/01/2021)   Exercise Vital Sign    Days of Exercise per Week: 5 days    Minutes of Exercise per Session: 40 min  Stress: No Stress Concern Present (05/01/2021)   [employer] of Occupational Health - Occupational Stress Questionnaire    Feeling of Stress : Not at all  Social Connections: Moderately Integrated (05/01/2021)  Social  Connection and Isolation Panel [NHANES]    Frequency of Communication with Friends and Family: More than three times a week    Frequency of Social Gatherings with Friends and Family: Three times a week    Attends Religious Services: More than 4 times per year    Active Member of Clubs or Organizations: No    Attends Banker Meetings: Never    Marital Status: Married  Catering manager Violence: Not At Risk (05/01/2021)   Humiliation, Afraid, Rape, and Kick questionnaire    Fear of Current or Ex-Partner: No    Emotionally Abused: No    Physically Abused: No    Sexually Abused: No   Family History  Problem Relation Age of Onset   Heart disease Brother    Stroke Brother      Review of Systems  Musculoskeletal:  Positive for back pain.  All other systems reviewed and are negative.      Objective:   Physical Exam Vitals reviewed.  Constitutional:      General: He is not in acute distress.    Appearance: He is well-developed. He is not diaphoretic.  HENT:     Head: Normocephalic and atraumatic.  Neck:     Thyroid: No thyromegaly.  Cardiovascular:     Rate and Rhythm: Normal rate and regular rhythm.     Heart sounds: Normal heart sounds. No murmur heard.    No friction rub. No gallop.  Pulmonary:     Effort: Pulmonary effort is normal. No respiratory distress.     Breath sounds: Normal breath sounds. No wheezing or rales.  Chest:     Chest wall: No tenderness.  Abdominal:     General: Bowel sounds are normal.     Palpations: Abdomen is soft.  Musculoskeletal:     Right shoulder: Tenderness and crepitus present. No effusion. Decreased range of motion. Decreased strength.     Left shoulder: Tenderness and crepitus present. Decreased range of motion. Decreased strength.     Right hand: Tenderness and bony tenderness present. Decreased strength. Normal sensation.     Left hand: Tenderness and bony tenderness present. Decreased strength. Normal sensation.      Cervical back: Normal range of motion and neck supple. No bony tenderness.     Right knee: No swelling or effusion. Normal range of motion. No medial joint line or lateral joint line tenderness.  Skin:    General: Skin is warm.     Coloration: Skin is not pale.     Findings: No erythema or rash.  Neurological:     Motor: No abnormal muscle tone.     Deep Tendon Reflexes: Reflexes are normal and symmetric.          Assessment & Plan:  Chronic pain of both shoulders - Plan: Ambulatory referral to Orthopedic Surgery  Pain in both hands - Plan: Ambulatory referral to Neurology Using a translator, I explained to the patient that he would have to live with the pain in his shoulder if he did not pursue surgery.  He has tried and failed physical therapy, NSAIDs, narcotics, etc.  Therefore he request to see Dr. Rozelle Corning regarding the pain in his shoulders and potential surgical correction.  I believe the pain in his hands is likely neuropathic in nature.  I see no evidence of autoimmune arthritis.  We will try prednisone  temporarily due to the severity of the pain.  However I recommended neurology for nerve conduction studies to evaluate for  possible carpal tunnel syndrome.  If present, would recommend hand surgery consultation

## 2024-04-14 ENCOUNTER — Ambulatory Visit: Admitting: Orthopedic Surgery

## 2024-04-17 ENCOUNTER — Ambulatory Visit: Payer: Self-pay

## 2024-04-17 NOTE — Telephone Encounter (Signed)
  Chief Complaint: bilateral hand/finger pain Symptoms: bilateral hands/fingers, L thumb numbness Frequency: a few days Pertinent Negatives: Patient denies fever, CP, SOB, injuries Disposition: [] ED /[] Urgent Care (no appt availability in office) / [] Appointment(In office/virtual)/ []  [place] [facility]/ [] Home Care/ [x] Refused Recommended Disposition /[] [facility]/ [x]  Follow-up with PCP Additional Notes: Pt c/o of bilateral hand/finger pain with L thumb numbness that has worsened in the past few days. Pt reports recent acute visit with PCP and was given steroid taper that provided relief. Pain is now returning and pt using OTC tylenol /alleve with minimal relief, especially at night. Pt requesting if Meloxicam  can be prescribed until he can see a specialist (Ortho appt 04/24/24). [facility] will forward encounter for Dr. Shirline Dover 's office to review and advise. Patient verbalized understanding and is expecting call back from office if able to send Rx to preferred pharmacy on file. Patient verbalized understanding and to call back with worsening symptoms.    Copied from CRM [id number]. Topic: Clinical - Red Word Triage >> Apr 17, 2024  3:54 PM Zipporah Him wrote: Red Word that prompted transfer to Nurse Triage: Patient is having pain in his arms and his fingers, 5-6 weeks. He states it has only been getting worse he says he has a pain increase at night. Reason for Disposition  [1] MODERATE pain (e.g., interferes with normal activities) AND [2] present > 3 days  Answer Assessment - Initial Assessment Questions 1. ONSET: "When did the pain start?"     Bilateral hands/fingers Recent acute visit with PCP and was prescribed steroid taper with relief Has been alternating OTC medications with some releif 2. LOCATION: "Where is the pain located?"     4-5 weeks 3. PAIN: "How bad is the pain?" (Scale 1-10; or mild, moderate, severe)   - MILD (1-3): Doesn't interfere with normal activities.   -  MODERATE (4-7): Interferes with normal activities (e.g., work or school) or awakens from sleep.   - SEVERE (8-10): Excruciating pain, unable to do any normal activities, unable to hold a cup of water.     Reports pain is worse at night, sometimes keeping him from sleep  8/10 at night 4. WORK OR EXERCISE: "Has there been any recent work or exercise that involved this part of the body?"     Denies working with hands a lot 5. CAUSE: "What do you think is causing the arm pain?"     unknown 6. OTHER SYMPTOMS: "Do you have any other symptoms?" (e.g., neck pain, swelling, rash, fever, numbness, weakness)     Endorses bilateral shoulder pain, L thumb numbness  Protocols used: Arm Pain-A-AH

## 2024-04-18 ENCOUNTER — Ambulatory Visit: Payer: Self-pay

## 2024-04-18 ENCOUNTER — Other Ambulatory Visit: Payer: Self-pay

## 2024-04-18 DIAGNOSIS — R200 Anesthesia of skin: Secondary | ICD-10-CM

## 2024-04-18 DIAGNOSIS — M79641 Pain in right hand: Secondary | ICD-10-CM

## 2024-04-18 DIAGNOSIS — G8929 Other chronic pain: Secondary | ICD-10-CM

## 2024-04-18 MED ORDER — MELOXICAM 15 MG PO TABS
15.0000 mg | ORAL_TABLET | Freq: Every day | ORAL | 1 refills | Status: DC
Start: 2024-04-18 — End: 2024-05-16

## 2024-04-18 NOTE — Telephone Encounter (Signed)
 Patient returning a call from office with interpreter on the line. Patient is requesting a medication for the arm and hand pain he has been experiencing. Patient stated he used to take Meloxicam  for a different kind of pain and it was helpful. Patient stated he is no longer taking diclofenac . This RN attempted to call CAL to maintain patient care and keep a closed loop of communication. Laraine Plate was available to speak with patient, but system error would not allow for transfer of call. This RN advised patient that the nurse from the office would call him back to discuss further. Patient complied and stated he would stay by his phone.   Copied from CRM [id number]. Topic: Clinical - Medication Question >> Apr 18, 2024 12:59 PM Rosaria Common wrote: Reason for CRM: Patient is having pain in his arms and his fingers, 5-6 weeks. He states it has only been getting worse he says he has a pain increase at night. He would like some recommended medication to be prescribed as soon as possible. Callback number is [phone]. Spanish Interpreter needed. Reason for Disposition  Prescription request for new medicine (not a refill)  Protocols used: Medication Refill and Renewal Call-A-AH

## 2024-04-24 ENCOUNTER — Other Ambulatory Visit (INDEPENDENT_AMBULATORY_CARE_PROVIDER_SITE_OTHER): Payer: Self-pay

## 2024-04-24 ENCOUNTER — Ambulatory Visit: Admitting: Orthopedic Surgery

## 2024-04-24 ENCOUNTER — Encounter: Payer: Self-pay | Admitting: Orthopedic Surgery

## 2024-04-24 DIAGNOSIS — M542 Cervicalgia: Secondary | ICD-10-CM | POA: Diagnosis not present

## 2024-04-24 MED ORDER — METHYLPREDNISOLONE 4 MG PO TBPK: ORAL_TABLET | ORAL | 0 refills | Status: DC

## 2024-04-25 ENCOUNTER — Encounter: Payer: Self-pay | Admitting: Orthopedic Surgery

## 2024-04-25 NOTE — Progress Notes (Signed)
 Negative scabbing MRI patient okay sounds good thanks  Office Visit Note   Patient: Bryan Mccullough           Date of Birth: [birth year]           MRN: [id number] Visit Date: 04/24/2024 Requested by: Austine Lefort, MD [address] [place],  [place] [address] PCP: Austine Lefort, MD  Subjective: Chief Complaint  Patient presents with   Right Shoulder - Follow-up   Left Shoulder - Follow-up    HPI: Bryan Mccullough is a 83 y.o. male who presents to the office reporting multiple orthopedic complaints.  Patient reports bilateral shoulder pain with last office visit 10/ 24.  Patient has known small rotator cuff tear on the left which she really did not want to do too much about.  He did do physical therapy in November and December.  He states overall he is "the same" from a shoulder perspective.  His fingers have been hurting him as well.  He cannot make a full fist.  Describes possibly some paresthesias.  Office visit today through a translator which definitely increase the complexity and duration of the visit.  He has been taking Tylenol  and Aleve which has not helped.  Did have a subacromial injection in September 2024.  Describes scapular pain as well as pain which radiates to the posterior aspect of both arms.  That has been going on for 6 weeks.  Says the pain level is about 5 out of 10 in the neck..                ROS: All systems reviewed are negative as they relate to the chief complaint within the history of present illness.  Patient denies fevers or chills.  Assessment & Plan: Visit Diagnoses:  1. Neck pain     Plan: Impression is significant cervical spine arthritis on plain radiographs.  I think that is accounting for at least some of his bilateral symptoms.  He does have small rotator cuff tear on the left-hand side may have similar problem on the right but he does have very good function in the shoulders.  He is having a lot of hand pain which could be carpal tunnel  or could be related to cervical spine stenosis.  Plan at this time is nerve conduction bilateral upper extremities to rule out carpal tunnel syndrome.  Radiographs of the shoulder do not show much in terms of arthritis.  Physical therapy here 1 time a week for 1 to 6 weeks for neck arthritis as a preamble likely to getting an MRI scan.  Medrol  Dosepak 6-day course ordered.  He will follow-up in 6 weeks for clinical recheck and decision for or against MRI scanning of the neck at that time.  Follow-Up Instructions: No follow-ups on file.   Orders:  Orders Placed This Encounter  Procedures   XR Cervical Spine 2 or 3 views   Ambulatory referral to Physical Medicine Rehab   Ambulatory referral to Physical Therapy   Meds ordered this encounter  Medications   methylPREDNISolone  (MEDROL  DOSEPAK) 4 MG TBPK tablet    Sig: Take as directed on package    Dispense:  21 tablet    Refill:  0      Procedures: No procedures performed   Clinical Data: No additional findings.  Objective: Vital Signs: There were no vitals taken for this visit.  Physical Exam:  Constitutional: Patient appears well-developed HEENT:  Head: Normocephalic Eyes:EOM are normal Neck:  Normal range of motion Cardiovascular: Normal rate Pulmonary/chest: Effort normal Neurologic: Patient is alert Skin: Skin is warm Psychiatric: Patient has normal mood and affect  Ortho Exam: Ortho exam demonstrates flexion chin to chest extension is about 30 degrees rotation is about 40 degrees bilaterally.  Patient has fairly weak grip strength bilaterally but has palpable radial pulses.  Wrist flexion and extension strength is 5 out of 5.  Biceps triceps and deltoid strength testing also 5 out of 5.  Negative Tinel's cubital tunnel bilaterally with no subluxation of the ulnar nerve.  He is get pretty reasonable rotator cuff strength to internal/external rotation and 15 degrees of abduction.  Subscap testing 5 out of 5 bilaterally.  Small  amount of crepitus which is not entirely out of the ordinary for someone in their 80s in both shoulders.  No definite paresthesias C5-T1.  No scapular dyskinesia with forward flexion of the arms.  Specialty Comments:  No specialty comments available.  Imaging: No results found.   PMFS History: Patient Active Problem List   Diagnosis Date Noted   Diabetes mellitus type 2 in nonobese (HCC)    HLD (hyperlipidemia)    BPH with elevated PSA    Esophageal stricture    Erectile dysfunction    Lumbosacral root lesions, not elsewhere classified 03/03/2013   Past Medical History:  Diagnosis Date   BPH with elevated PSA    Diabetes mellitus type 2 in nonobese (HCC)    Diverticulosis    Erectile dysfunction    Esophageal stricture    GERD (gastroesophageal reflux disease)    Hiatal hernia    HLD (hyperlipidemia)     Family History  Problem Relation Age of Onset   Heart disease Brother    Stroke Brother     Past Surgical History:  Procedure Laterality Date   PROSTATE SURGERY     Social History   Occupational History   Not on file  Tobacco Use   Smoking status: Never   Smokeless tobacco: Never  Substance and Sexual Activity   Alcohol use: No   Drug use: No   Sexual activity: Yes    Comment: married, bus driver

## 2024-05-02 ENCOUNTER — Telehealth: Payer: Self-pay

## 2024-05-02 NOTE — Telephone Encounter (Signed)
 Copied from CRM [id number]. Topic: Clinical - Medication Question >> May 02, 2024 12:34 PM Bryan Mccullough wrote: Reason for CRM: Patient was advised by ortho provider to see PCP for a prescription for gabapentin. Callback number is [phone].

## 2024-05-16 ENCOUNTER — Ambulatory Visit: Admitting: Family Medicine

## 2024-05-16 ENCOUNTER — Encounter: Payer: Self-pay | Admitting: Family Medicine

## 2024-05-16 ENCOUNTER — Ambulatory Visit: Payer: Self-pay | Admitting: Family Medicine

## 2024-05-16 VITALS — BP 121/60 | HR 84 | Ht 68.0 in

## 2024-05-16 DIAGNOSIS — M2550 Pain in unspecified joint: Secondary | ICD-10-CM | POA: Diagnosis not present

## 2024-05-16 DIAGNOSIS — R3589 Other polyuria: Secondary | ICD-10-CM | POA: Diagnosis not present

## 2024-05-16 DIAGNOSIS — M5030 Other cervical disc degeneration, unspecified cervical region: Secondary | ICD-10-CM

## 2024-05-16 LAB — URINALYSIS, ROUTINE W REFLEX MICROSCOPIC
Bacteria, UA: NONE SEEN /HPF
Bilirubin Urine: NEGATIVE
Glucose, UA: NEGATIVE
Hyaline Cast: NONE SEEN /LPF
Leukocytes,Ua: NEGATIVE
Nitrite: NEGATIVE
Specific Gravity, Urine: 1.025 (ref 1.001–1.035)
Squamous Epithelial / HPF: NONE SEEN /HPF (ref <=5)
WBC, UA: NONE SEEN /HPF (ref 0–5)
pH: 6 (ref 5.0–8.0)

## 2024-05-16 LAB — MICROSCOPIC MESSAGE

## 2024-05-16 MED ORDER — GABAPENTIN 300 MG PO CAPS: 300.0000 mg | ORAL_CAPSULE | Freq: Three times a day (TID) | ORAL | 3 refills | Status: DC

## 2024-05-16 NOTE — Progress Notes (Signed)
 Subjective:    Patient ID: Bryan Mccullough, male    DOB: Jul 20, 1941, 83 y.o.   MRN: [id number] 02/18/23 Patient continues to endorse pain in both shoulders.  He is not taking diclofenac  regularly.  He states that he forgets to take it.  His range of motion in his left shoulder is quite good however he has pain with abduction greater than 100 degrees in his right shoulder.  He also complains of pain with external rotation.  He states that it hurts more at night.  He states that has been bothering him more after he fell.  He was cutting a branch with a saw.  He fell and he jerked his arm backward and external rotation as he fell.  Since that time he has had more persistent pain.  At that time, my plan was: He is complaining of shoulder pain for quite some time however this is more likely to be rotator cuff particular given the mechanism of action.  Using sterile technique, I injected the right subacromial space with 2 cc of lidocaine , 2 cc of Marcaine , and 2 cc of 40 mg/mL Kenalog .  He tolerated the procedure well without complication.  I will repeat his A1c which was 6.4 in November.  He does not want to take the metformin  twice a day therefore I will transition him to metformin  extended release 1000 mg p.o. every morning to provide 24-hour coverage  06/21/23 Patient saw very little benefit from the cortisone injection that I gave him in his right shoulder.  He has been dealing with this shoulder pain for years.  We obtained x-rays in 2021 that showed no significant joint pathology.  However now he is reporting pain in both shoulders left greater than right with abduction greater then 90 degrees.  He states that he can barely lift his arms to put his shirt on.  Here today he has noticeable weakness with empty can testing.  Abduction is limited to 3/5 in the right shoulder.  Left shoulder his strength is better.  Abduction of the left shoulder is 4 out of 5.  However empty can testing elicits pain in both  shoulders.  Hawking's maneuver elicits pain in both shoulders.  He has normal reflexes checked at the biceps and brachioradialis.  He has normal grip strength.  Bicep strength is normal.  He has negative Spurling sign.  He has no numbness or tingling in either arm.  At that time, my plan was: Patient has been dealing with pain in both shoulders now for more than 3 years.  We have tried cortisone injections.  We have tried NSAIDs including meloxicam  and diclofenac  without benefit.  Patient is currently taking Aleve without benefit.  At this point, I believe we need to proceed with an MRI of the shoulder to determine the extent of the damage to the rotator cuff.  X-rays were unremarkable.  Mri: IMPRESSION: 1. Severe tendinosis of the supraspinatus tendon with a complete tear 15 mm from the peripheral insertion and with 12 mm of retraction. 2. Severe tendinosis of the infraspinatus tendon. 3. Partial-thickness articular surface tear of the subscapularis tendon. 4. Moderate tendinosis of the intra-articular portion of the long head of the biceps tendon with a high-grade partial-thickness tear. 5. Nondisplaced fracture of the distal clavicle with surrounding bone marrow edema.   03/03/24 Due to the severe tendinosis and tears I recommended an orthopedic consultation.  He saw Dr. Rozelle Corning in October but the patient not want surgery.  He tried physical  therapy but saw very little benefit with regards to his shoulder pain.  Despite taking NSAIDs and Tylenol , he continues to have daily pain in both shoulders.  The left shoulder is now hurting as much as the right shoulder.  We discussed repeat an MRI of the left shoulder to determine if he has a tear in that muscle however he does not want surgery due to his age and therefore the choice would still be pain control.  Unfortunately NSAIDs are not controlling the pain.  He also reports numbness and tingling and burning in both hands from his wrist to his  fingertips.  This just started a few months ago.  He also complains of pain in the MCP PIP and DIP joints.  At that time, my plan was: I offered the patient a referral back to orthopedics to discuss surgical correction of the tears in the tendons of his shoulder.  He declines any surgery.  Therefore I will not order an MRI of the left shoulder because if the patient had tears he would not want surgery.  Instead we will focus on pain control.  He can use NSAIDs or Tylenol  for pain on a daily basis.  He can use hydrocodone  5/325 1 p.o. nightly at night to help him sleep.  I will give him 30 tablets to last a month.  The pain in his hands appears to be new.  I suspect that this is osteoarthritis.  However I will check a sedimentation rate and a rheumatoid factor to evaluate for possible autoimmune disease.  The numbness and tingling is suspicious for carpal tunnel syndrome.  i will consult neurology for nerve conduction studies to confirm my suspicion.  03/27/24  Patient states that he was unable to take the hydrocodone .  He felt like the medication was too strong.  Patient with pain shoulder.  We again discussed the previous MRI results.  He now would like to see the orthopedist to discuss possible surgical correction given that the diclofenac  and hydrocodone  are not beneficial.  He continues to report pain in both hands.  He reports numbness and tingling in his fingertips.  He reports shooting pain radiating from his wrist to his fingertips.  He has a positive Tinel sign today.  He has a positive Phalen sign.  He has normal reflexes checked with the brachioradialis and the bicep.  He has decreased grip strength in both hands however his effort was very poor due to pain and perceived tightness in his MCP and PIP joints on both hands.  Rheumatoid factor test and sed rate were negative.  Patient states that someone called him about the nerve conduction study but he was unable to return the call  05/16/24 Patient  has seen Dr. Rozelle Corning with orthopedics who feels the majority of his pain in his upper extremities is due to cervical spinal stenosis.  He started him on a Medrol  Dosepak.  He has been recommended physical therapy and then perhaps an MRI of the neck.  However he wanted Mr. Capell to discuss gabapentin with me.  Mr. Calcaterra also wants to check urine test rule out urinary tract failure due to polyuria however this is most likely due to his significant BPH.  He also would like to check a uric acid level to rule out gout as his wife has gout due to his numerous joint pains. Past Medical History:  Diagnosis Date   BPH with elevated PSA    Diabetes mellitus type 2 in nonobese (HCC)  Diverticulosis    Erectile dysfunction    Esophageal stricture    GERD (gastroesophageal reflux disease)    Hiatal hernia    HLD (hyperlipidemia)    Past Surgical History:  Procedure Laterality Date   PROSTATE SURGERY     Current Outpatient Medications on File Prior to Visit  Medication Sig Dispense Refill   acetaminophen  (TYLENOL ) 325 MG tablet Take 325 mg by mouth every 6 (six) hours as needed for mild pain (pain score 1-3).     Blood Glucose Monitoring Suppl DEVI 1 each by Does not apply route in the morning, at noon, and at bedtime. May substitute to any manufacturer covered by patient's insurance. 1 each 0   finasteride  (PROSCAR ) 5 MG tablet Take 5 mg by mouth daily.     HYDROcodone -acetaminophen  (NORCO/VICODIN) 5-325 MG tablet Take 1 tablet by mouth every 6 (six) hours as needed for moderate pain (pain score 4-6). 30 tablet 0   metFORMIN  (GLUCOPHAGE -XR) 500 MG 24 hr tablet Take 2 tablets (1,000 mg total) by mouth daily with breakfast. 180 tablet 3   methylPREDNISolone  (MEDROL  DOSEPAK) 4 MG TBPK tablet Take as directed on package 21 tablet 0   pravastatin  (PRAVACHOL ) 20 MG tablet TAKE 1 TABLET BY MOUTH DAILY 90 tablet 3   predniSONE  (DELTASONE ) 20 MG tablet 3 tabs poqday 1-2, 2 tabs poqday 3-4, 1 tab poqday 5-6 12  tablet 0   senna-docusate (SENOKOT-S) 8.6-50 MG tablet Take 2 tablets by mouth daily. 60 tablet 3   tamsulosin (FLOMAX) 0.4 MG CAPS capsule Take 0.4 mg by mouth daily.     Multiple Vitamins-Minerals (CENTRUM SILVER ULTRA MENS PO) Take 1 tablet by mouth daily.     No current facility-administered medications on file prior to visit.    Allergies  Allergen Reactions   Penicillins    Social History   Socioeconomic History   Marital status: Single    Spouse name: Not on file   Number of children: Not on file   Years of education: Not on file   Highest education level: Not on file  Occupational History   Not on file  Tobacco Use   Smoking status: Never   Smokeless tobacco: Never  Vaping Use   Vaping status: Never Used  Substance and Sexual Activity   Alcohol use: No   Drug use: No   Sexual activity: Yes    Comment: married, bus driver  Other Topics Concern   Not on file  Social History Narrative   Not on file   Social Drivers of Health   Financial Resource Strain: Low Risk  (05/01/2021)   Overall Financial Resource Strain (CARDIA)    Difficulty of Paying Living Expenses: Not hard at all  Food Insecurity: No Food Insecurity (05/01/2021)   Hunger Vital Sign    Worried About Running Out of Food in the Last Year: Never true    Ran Out of Food in the Last Year: Never true  Transportation Needs: No Transportation Needs (05/01/2021)   PRAPARE - Administrator, Civil Service (Medical): No    Lack of Transportation (Non-Medical): No  Physical Activity: Sufficiently Active (05/01/2021)   Exercise Vital Sign    Days of Exercise per Week: 5 days    Minutes of Exercise per Session: 40 min  Stress: No Stress Concern Present (05/01/2021)   [employer] of Occupational Health - Occupational Stress Questionnaire    Feeling of Stress : Not at all  Social Connections: Moderately Integrated (05/01/2021)  Social Connection and Isolation Panel [NHANES]    Frequency of  Communication with Friends and Family: More than three times a week    Frequency of Social Gatherings with Friends and Family: Three times a week    Attends Religious Services: More than 4 times per year    Active Member of Clubs or Organizations: No    Attends Banker Meetings: Never    Marital Status: Married  Catering manager Violence: Not At Risk (05/01/2021)   Humiliation, Afraid, Rape, and Kick questionnaire    Fear of Current or Ex-Partner: No    Emotionally Abused: No    Physically Abused: No    Sexually Abused: No   Family History  Problem Relation Age of Onset   Heart disease Brother    Stroke Brother      Review of Systems  Musculoskeletal:  Positive for back pain.  All other systems reviewed and are negative.      Objective:   Physical Exam Vitals reviewed.  Constitutional:      General: He is not in acute distress.    Appearance: He is well-developed. He is not diaphoretic.  HENT:     Head: Normocephalic and atraumatic.  Neck:     Thyroid: No thyromegaly.  Cardiovascular:     Rate and Rhythm: Normal rate and regular rhythm.     Heart sounds: Normal heart sounds. No murmur heard.    No friction rub. No gallop.  Pulmonary:     Effort: Pulmonary effort is normal. No respiratory distress.     Breath sounds: Normal breath sounds. No wheezing or rales.  Chest:     Chest wall: No tenderness.  Abdominal:     General: Bowel sounds are normal.     Palpations: Abdomen is soft.  Musculoskeletal:     Right shoulder: Tenderness and crepitus present. No effusion. Decreased range of motion. Decreased strength.     Left shoulder: Tenderness and crepitus present. Decreased range of motion. Decreased strength.     Right hand: Tenderness and bony tenderness present. Decreased strength. Normal sensation.     Left hand: Tenderness and bony tenderness present. Decreased strength. Normal sensation.     Cervical back: Normal range of motion and neck supple. No  bony tenderness.     Right knee: No swelling or effusion. Normal range of motion. No medial joint line or lateral joint line tenderness.  Skin:    General: Skin is warm.     Coloration: Skin is not pale.     Findings: No erythema or rash.  Neurological:     Motor: No abnormal muscle tone.     Deep Tendon Reflexes: Reflexes are normal and symmetric.          Assessment & Plan:  Polyarthralgia - Plan: Uric Acid  Polyuria - Plan: Urinalysis, Routine w reflex microscopic  DDD (degenerative disc disease), cervical Patient shoulder and neck pain and arm pain could certainly be due to cervical spinal stenosis.  I feel.  ANCA.  Recommended trying gabapentin 300 mg 3 times a day.  Meanwhile I will happily check a urinalysis but I suspect his polyuria is due to his BPH.  I will check a uric acid but I feel the majority of his pain is due to regular osteoarthritis and not gout.  Reassess in 2 weeks to see if his neck and arm pain is improving on gabapentin

## 2024-05-17 LAB — URIC ACID: Uric Acid, Serum: 5.8 mg/dL (ref 4.0–8.0)

## 2024-05-22 ENCOUNTER — Emergency Department (HOSPITAL_COMMUNITY)
Admission: EM | Admit: 2024-05-22 | Discharge: 2024-05-22 | Disposition: A | Discharge status: Home / Self Care | Attending: Emergency Medicine | Admitting: Emergency Medicine

## 2024-05-22 ENCOUNTER — Encounter (HOSPITAL_COMMUNITY): Payer: Self-pay

## 2024-05-22 ENCOUNTER — Other Ambulatory Visit: Payer: Self-pay

## 2024-05-22 DIAGNOSIS — M2550 Pain in unspecified joint: Secondary | ICD-10-CM | POA: Diagnosis not present

## 2024-05-22 DIAGNOSIS — M130 Polyarthritis, unspecified: Secondary | ICD-10-CM | POA: Insufficient documentation

## 2024-05-22 DIAGNOSIS — M13811 Other specified arthritis, right shoulder: Secondary | ICD-10-CM | POA: Diagnosis not present

## 2024-05-22 LAB — CBC WITH DIFFERENTIAL/PLATELET
Abs Immature Granulocytes: 0.02 10*3/uL (ref 0.00–0.07)
Basophils Absolute: 0 10*3/uL (ref 0.0–0.1)
Basophils Relative: 1 %
Eosinophils Absolute: 0.1 10*3/uL (ref 0.0–0.5)
Eosinophils Relative: 1 %
HCT: 42.4 % (ref 39.0–52.0)
Hemoglobin: 13.5 g/dL (ref 13.0–17.0)
Immature Granulocytes: 0 %
Lymphocytes Relative: 21 %
Lymphs Abs: 1.6 10*3/uL (ref 0.7–4.0)
MCH: 28.2 pg (ref 26.0–34.0)
MCHC: 31.8 g/dL (ref 30.0–36.0)
MCV: 88.7 fL (ref 80.0–100.0)
Monocytes Absolute: 0.9 10*3/uL (ref 0.1–1.0)
Monocytes Relative: 12 %
Neutro Abs: 4.9 10*3/uL (ref 1.7–7.7)
Neutrophils Relative %: 65 %
Platelets: 323 10*3/uL (ref 150–400)
RBC: 4.78 MIL/uL (ref 4.22–5.81)
RDW: 13.7 % (ref 11.5–15.5)
WBC: 7.5 10*3/uL (ref 4.0–10.5)
nRBC: 0 % (ref 0.0–0.2)

## 2024-05-22 LAB — BASIC METABOLIC PANEL WITH GFR
Anion gap: 12 (ref 5–15)
BUN: 15 mg/dL (ref 8–23)
CO2: 25 mmol/L (ref 22–32)
Calcium: 9.1 mg/dL (ref 8.9–10.3)
Chloride: 102 mmol/L (ref 98–111)
Creatinine, Ser: 0.8 mg/dL (ref 0.61–1.24)
GFR, Estimated: >60 mL/min (ref >60)
Glucose, Bld: 176 mg/dL — ABNORMAL HIGH (ref 70–99)
Potassium: 4.2 mmol/L (ref 3.5–5.1)
Sodium: 139 mmol/L (ref 135–145)

## 2024-05-22 MED ORDER — DOXYCYCLINE HYCLATE 100 MG PO CAPS: 100.0000 mg | ORAL_CAPSULE | Freq: Two times a day (BID) | ORAL | 0 refills | Status: AC

## 2024-05-22 MED ORDER — COLCHICINE 0.6 MG PO TABS: 0.6000 mg | ORAL_TABLET | Freq: Two times a day (BID) | ORAL | 0 refills | Status: AC

## 2024-05-22 MED ORDER — METHYLPREDNISOLONE 4 MG PO TBPK: ORAL_TABLET | ORAL | 0 refills | Status: DC

## 2024-05-22 NOTE — ED Provider Triage Note (Signed)
 Emergency Medicine Provider Triage Evaluation Note  Bryan Mccullough , a 83 y.o. male  was evaluated in triage.  Pt complains of joint pain, Hx of same.  Seen by his family medicine doctor for same.  No change in symptoms.  Reports hard to do ADLs.  Review of Systems  Positive: Polyarthralgia, Negative: Numbness, weakness, trauma  Physical Exam  BP 130/87   Pulse 88   Temp 98.3 F (36.8 C)   Resp 14   SpO2 96%  Gen:   Awake, no distress   Resp:  Normal effort  MSK:   Moves extremities without difficulty  Other:    Medical Decision Making  Medically screening exam initiated at 2:45 PM.  Appropriate orders placed.  Bryan Mccullough was informed that the remainder of the evaluation will be completed by another provider, this initial triage assessment does not replace that evaluation, and the importance of remaining in the ED until their evaluation is complete.  Labs ordered   Carie Charity, PA-C 05/22/24 1447

## 2024-05-22 NOTE — Telephone Encounter (Signed)
 Interpreter ID [id number]  Patient aware of results and recommendations.

## 2024-05-22 NOTE — Telephone Encounter (Signed)
-----   Message from Austine Lefort sent at 05/16/2024  5:31 PM EDT ----- No uti.

## 2024-05-22 NOTE — ED Provider Notes (Signed)
 [facility] AT [facility] Provider Note   CSN: [id number] Arrival date & time: 05/22/24  1320     Patient presents with: Joint Pain   Bryan Mccullough is a 83 y.o. male.   HPI   This patient is an 83 year old male, he has had some degree of joint pain which has been going on for a while, initially he was having pain in both shoulders and had been taking anti-inflammatories but not consistently, he then started to have some increasing pain in his hands, he saw his doctor on May 16, 2024 approximately 1 week ago, there was concern that he may have spinal stenosis related to his neck and shoulder pain.  The patient is not complaining of that today he is complaining of swelling in his bilateral hands and pain behind his bilateral thighs.  He had some testing performed by his doctor including serum testing as well as testing for gout and started on gabapentin  6 days ago.  He states that this is not helping very much.  He was also recently placed on prednisone  at an orthopedic visit which he is no longer taking.  He denies fevers or chills, denies injuries, he has no pain in his feet ankles knees or hips and has no pain in his elbows or shoulders at this time.  There is no changes in vision no chest pain cough or shortness of breath or abdominal pain and no nausea vomiting or diarrhea.  He has not been tested for other autoimmune diseases  Review of the medical record shows that the patient had a uric acid performed last week which was in normal range, for some reason a urinalysis was performed which showed mild proteinuria but no infection.  Back in March of this year when he was having similar joint pains he was tested with a rheumatoid factor, this was undetectably low, a sed rate which was 11 at that time  The patient has not been referred to rheumatology up to this point  Prior to Admission medications   Medication Sig Start Date End Date Taking? Authorizing  Provider  colchicine 0.6 MG tablet Take 1 tablet (0.6 mg total) by mouth 2 (two) times daily. 05/22/24  Yes Early Glisson, MD  methylPREDNISolone  (MEDROL  DOSEPAK) 4 MG TBPK tablet Taper over 6 days 05/22/24  Yes Early Glisson, MD  acetaminophen  (TYLENOL ) 325 MG tablet Take 325 mg by mouth every 6 (six) hours as needed for mild pain (pain score 1-3).    [provider]  Blood Glucose Monitoring Suppl DEVI 1 each by Does not apply route in the morning, at noon, and at bedtime. May substitute to any manufacturer covered by patient's insurance. 07/06/23   Austine Lefort, MD  finasteride  (PROSCAR ) 5 MG tablet Take 5 mg by mouth daily. 12/25/22   [provider]  gabapentin  (NEURONTIN ) 300 MG capsule Take 1 capsule (300 mg total) by mouth 3 (three) times daily. 05/16/24   Austine Lefort, MD  HYDROcodone -acetaminophen  (NORCO/VICODIN) 5-325 MG tablet Take 1 tablet by mouth every 6 (six) hours as needed for moderate pain (pain score 4-6). 03/03/24   Austine Lefort, MD  metFORMIN  (GLUCOPHAGE -XR) 500 MG 24 hr tablet Take 2 tablets (1,000 mg total) by mouth daily with breakfast. 02/19/23   Austine Lefort, MD  Multiple Vitamins-Minerals (CENTRUM SILVER ULTRA MENS PO) Take 1 tablet by mouth daily.    [provider]  pravastatin  (PRAVACHOL ) 20 MG tablet TAKE 1 TABLET BY MOUTH  DAILY 10/25/23   Austine Lefort, MD  senna-docusate (SENOKOT-S) 8.6-50 MG tablet Take 2 tablets by mouth daily. 08/09/20   Austine Lefort, MD  tamsulosin (FLOMAX) 0.4 MG CAPS capsule Take 0.4 mg by mouth daily. 12/17/22   [provider]    Allergies: Penicillins    Review of Systems  All other systems reviewed and are negative.   Updated Vital Signs BP 130/87   Pulse 88   Temp 98.3 F (36.8 C)   Resp 14   SpO2 96%   Physical Exam Vitals and nursing note reviewed.  Constitutional:      General: He is not in acute distress.    Appearance: He is well-developed.  HENT:     Head:  Normocephalic and atraumatic.     Mouth/Throat:     Pharynx: No oropharyngeal exudate.   Eyes:     General: No scleral icterus.       Right eye: No discharge.        Left eye: No discharge.     Conjunctiva/sclera: Conjunctivae normal.     Pupils: Pupils are equal, round, and reactive to light.   Neck:     Thyroid: No thyromegaly.     Vascular: No JVD.   Cardiovascular:     Rate and Rhythm: Normal rate and regular rhythm.     Heart sounds: Normal heart sounds. No murmur heard.    No friction rub. No gallop.  Pulmonary:     Effort: Pulmonary effort is normal. No respiratory distress.     Breath sounds: Normal breath sounds. No wheezing or rales.  Abdominal:     General: Bowel sounds are normal. There is no distension.     Palpations: Abdomen is soft. There is no mass.     Tenderness: There is no abdominal tenderness.   Musculoskeletal:        General: Tenderness present. Normal range of motion.     Cervical back: Normal range of motion and neck supple.     Right lower leg: No edema.     Left lower leg: No edema.     Comments: There is swelling to the bilateral MCP joints and fingers, there is no redness or warmth, he has good range of motion but pain with trying to make a grip.  Normal pulses bilaterally and capillary refill to all fingers are normal.  He has normal range of motion and no swelling or pain at the elbows or shoulders bilaterally.  Bilateral legs examined without any signs of edema, normal range of motion of the hips knees and ankles.  Tenderness in the posterior thigh just proximal to the knee on both sides.  No swelling or edema and no restricted range of motion or obvious effusions of those joints  Lymphadenopathy:     Cervical: No cervical adenopathy.   Skin:    General: Skin is warm and dry.     Findings: No erythema or rash.   Neurological:     Mental Status: He is alert.     Coordination: Coordination normal.   Psychiatric:        Behavior: Behavior  normal.     (all labs ordered are listed, but only abnormal results are displayed) Labs Reviewed  BASIC METABOLIC PANEL WITH GFR - Abnormal; Notable for the following components:      Result Value   Glucose, Bld 176 (*)    All other components within normal limits  CBC WITH DIFFERENTIAL/PLATELET  LYME DISEASE SEROLOGY  W/REFLEX    EKG: None  Radiology: No results found.   Procedures   Medications Ordered in the ED - No data to display                                  Medical Decision Making Risk Prescription drug management.   This patient is most likely suffering from some type of autoimmune process.  I do not think it would be unreasonable to try him on a course of colchicine despite having negative uric acid as this is not terribly specific or sensitive for that illness.  That being said he needs to see a rheumatologist, he is on metformin  for borderline diabetes, another course of prednisone  would be an option as well.  I will message his family doctor regarding our examination here, he had a normal CBC without any findings of leukocytosis  Patient is agreeable to the plan, doubt septic joint, would also consider Lyme's disease with polyarthralgias as he has stated that he is found multiple ticks on him but cannot remember the last time     Final diagnoses:  Polyarthritis    ED Discharge Orders          Ordered    methylPREDNISolone  (MEDROL  DOSEPAK) 4 MG TBPK tablet        05/22/24 1607    colchicine 0.6 MG tablet  2 times daily        05/22/24 1607               Early Glisson, MD 05/22/24 [phone]

## 2024-05-22 NOTE — Discharge Instructions (Addendum)
 Your testing here has been normal, you will need to have your family doctor refer you to a rheumatologist.  I will share a message with them to encourage them to do this.  In the meantime you can take the following medications  Colchicine 1 tablet by mouth twice a day for 7 days, no longer  Medrol  Dosepak, take 6 pills today then 5 pills on the next day, 4 pills on the next day, 3 pills on the next day or 2 pills on the next day and 1 pill on the last day.  Lease take doxycycline twice a day until you follow-up with your family doctor to go over the results of your Lyme's disease testing which we sent to the pharmacy today  Thank you for allowing us  to treat you in the emergency department today.  After reviewing your examination and potential testing that was done it appears that you are safe to go home.  I would like for you to follow-up with your doctor within the next several days, have them obtain your records and follow-up with them to review all potential tests and results from your visit.  If you should develop severe or worsening symptoms return to the emergency department immediately

## 2024-05-22 NOTE — ED Triage Notes (Signed)
 Patient reports hx of chronic joint pain that is progressively getting worse and making it difficult for him to perform ADLs. Patient seen this at PCP and give gabapentin  and reports it did not help at all. 8/10 pain at this time.

## 2024-05-23 ENCOUNTER — Other Ambulatory Visit: Payer: Self-pay | Admitting: Family Medicine

## 2024-05-23 DIAGNOSIS — R29898 Other symptoms and signs involving the musculoskeletal system: Secondary | ICD-10-CM

## 2024-05-23 DIAGNOSIS — M5030 Other cervical disc degeneration, unspecified cervical region: Secondary | ICD-10-CM

## 2024-05-23 DIAGNOSIS — M5412 Radiculopathy, cervical region: Secondary | ICD-10-CM

## 2024-05-23 DIAGNOSIS — R202 Paresthesia of skin: Secondary | ICD-10-CM

## 2024-05-23 LAB — LYME DISEASE SEROLOGY W/REFLEX: Lyme Total Antibody EIA: NEGATIVE

## 2024-06-01 ENCOUNTER — Encounter: Payer: Self-pay | Admitting: Family Medicine

## 2024-06-01 ENCOUNTER — Ambulatory Visit: Admitting: Family Medicine

## 2024-06-01 VITALS — BP 122/60 | HR 73 | Temp 98.5°F | Ht 68.0 in | Wt 132.0 lb

## 2024-06-01 DIAGNOSIS — M5030 Other cervical disc degeneration, unspecified cervical region: Secondary | ICD-10-CM

## 2024-06-01 DIAGNOSIS — M130 Polyarthritis, unspecified: Secondary | ICD-10-CM | POA: Diagnosis not present

## 2024-06-01 DIAGNOSIS — R200 Anesthesia of skin: Secondary | ICD-10-CM | POA: Diagnosis not present

## 2024-06-01 NOTE — Progress Notes (Signed)
 Subjective:    Patient ID: Bryan Mccullough, male    DOB: November 01, 1941, 83 y.o.   MRN: [id number] 02/18/23 Patient continues to endorse pain in both shoulders.  He is not taking diclofenac  regularly.  He states that he forgets to take it.  His range of motion in his left shoulder is quite good however he has pain with abduction greater than 100 degrees in his right shoulder.  He also complains of pain with external rotation.  He states that it hurts more at night.  He states that has been bothering him more after he fell.  He was cutting a branch with a saw.  He fell and he jerked his arm backward and external rotation as he fell.  Since that time he has had more persistent pain.  At that time, my plan was: He is complaining of shoulder pain for quite some time however this is more likely to be rotator cuff particular given the mechanism of action.  Using sterile technique, I injected the right subacromial space with 2 cc of lidocaine , 2 cc of Marcaine , and 2 cc of 40 mg/mL Kenalog .  He tolerated the procedure well without complication.  I will repeat his A1c which was 6.4 in November.  He does not want to take the metformin  twice a day therefore I will transition him to metformin  extended release 1000 mg p.o. every morning to provide 24-hour coverage  06/21/23 Patient saw very little benefit from the cortisone injection that I gave him in his right shoulder.  He has been dealing with this shoulder pain for years.  We obtained x-rays in 2021 that showed no significant joint pathology.  However now he is reporting pain in both shoulders left greater than right with abduction greater then 90 degrees.  He states that he can barely lift his arms to put his shirt on.  Here today he has noticeable weakness with empty can testing.  Abduction is limited to 3/5 in the right shoulder.  Left shoulder his strength is better.  Abduction of the left shoulder is 4 out of 5.  However empty can testing elicits pain in both  shoulders.  Hawking's maneuver elicits pain in both shoulders.  He has normal reflexes checked at the biceps and brachioradialis.  He has normal grip strength.  Bicep strength is normal.  He has negative Spurling sign.  He has no numbness or tingling in either arm.  At that time, my plan was: Patient has been dealing with pain in both shoulders now for more than 3 years.  We have tried cortisone injections.  We have tried NSAIDs including meloxicam  and diclofenac  without benefit.  Patient is currently taking Aleve without benefit.  At this point, I believe we need to proceed with an MRI of the shoulder to determine the extent of the damage to the rotator cuff.  X-rays were unremarkable.  Mri: IMPRESSION: 1. Severe tendinosis of the supraspinatus tendon with a complete tear 15 mm from the peripheral insertion and with 12 mm of retraction. 2. Severe tendinosis of the infraspinatus tendon. 3. Partial-thickness articular surface tear of the subscapularis tendon. 4. Moderate tendinosis of the intra-articular portion of the long head of the biceps tendon with a high-grade partial-thickness tear. 5. Nondisplaced fracture of the distal clavicle with surrounding bone marrow edema.   03/03/24 Due to the severe tendinosis and tears I recommended an orthopedic consultation.  He saw Dr. Addie in October but the patient not want surgery.  He tried physical  therapy but saw very little benefit with regards to his shoulder pain.  Despite taking NSAIDs and Tylenol , he continues to have daily pain in both shoulders.  The left shoulder is now hurting as much as the right shoulder.  We discussed repeat an MRI of the left shoulder to determine if he has a tear in that muscle however he does not want surgery due to his age and therefore the choice would still be pain control.  Unfortunately NSAIDs are not controlling the pain.  He also reports numbness and tingling and burning in both hands from his wrist to his  fingertips.  This just started a few months ago.  He also complains of pain in the MCP PIP and DIP joints.  At that time, my plan was: I offered the patient a referral back to orthopedics to discuss surgical correction of the tears in the tendons of his shoulder.  He declines any surgery.  Therefore I will not order an MRI of the left shoulder because if the patient had tears he would not want surgery.  Instead we will focus on pain control.  He can use NSAIDs or Tylenol  for pain on a daily basis.  He can use hydrocodone  5/325 1 p.o. nightly at night to help him sleep.  I will give him 30 tablets to last a month.  The pain in his hands appears to be new.  I suspect that this is osteoarthritis.  However I will check a sedimentation rate and a rheumatoid factor to evaluate for possible autoimmune disease.  The numbness and tingling is suspicious for carpal tunnel syndrome.  i will consult neurology for nerve conduction studies to confirm my suspicion.  03/27/24  Patient states that he was unable to take the hydrocodone .  He felt like the medication was too strong.  Patient with pain shoulder.  We again discussed the previous MRI results.  He now would like to see the orthopedist to discuss possible surgical correction given that the diclofenac  and hydrocodone  are not beneficial.  He continues to report pain in both hands.  He reports numbness and tingling in his fingertips.  He reports shooting pain radiating from his wrist to his fingertips.  He has a positive Tinel sign today.  He has a positive Phalen sign.  He has normal reflexes checked with the brachioradialis and the bicep.  He has decreased grip strength in both hands however his effort was very poor due to pain and perceived tightness in his MCP and PIP joints on both hands.  Rheumatoid factor test and sed rate were negative.  Patient states that someone called him about the nerve conduction study but he was unable to return the call  05/16/24 Patient  has seen Dr. Addie with orthopedics who feels the majority of his pain in his upper extremities is due to cervical spinal stenosis.  He started him on a Medrol  Dosepak.  He has been recommended physical therapy and then perhaps an MRI of the neck.  However he wanted Mr. Lumm to discuss gabapentin  with me.  Mr. Dewolfe also wants to check urine test rule out urinary tract failure due to polyuria however this is most likely due to his significant BPH.  He also would like to check a uric acid level to rule out gout as his wife has gout due to his numerous joint pains.  At that time, my plan was: Patient shoulder and neck pain and arm pain could certainly be due to cervical spinal  stenosis.  I feel.  ANCA.  Recommended trying gabapentin  300 mg 3 times a day.  Meanwhile I will happily check a urinalysis but I suspect his polyuria is due to his BPH.  I will check a uric acid but I feel the majority of his pain is due to regular osteoarthritis and not gout.  Reassess in 2 weeks to see if his neck and arm pain is improving on gabapentin   06/01/24 Shortly after I last saw him, patient went to the emergency room complaining of polyarthralgias.  The ER physician tested him for lupus which was negative, gout and the uric acid level was normal.  He suggested that he see a rheumatologist.  I feel that the patient has cervical spinal stenosis.  He has an MRI scheduled for 7/9.  This will be of the cervical spine.  Patient is here today with numerous questions. Past Medical History:  Diagnosis Date   BPH with elevated PSA    Diabetes mellitus type 2 in nonobese [facility])    Diverticulosis    Erectile dysfunction    Esophageal stricture    GERD (gastroesophageal reflux disease)    Hiatal hernia    HLD (hyperlipidemia)    Past Surgical History:  Procedure Laterality Date   PROSTATE SURGERY     Current Outpatient Medications on File Prior to Visit  Medication Sig Dispense Refill   acetaminophen  (TYLENOL ) 325 MG  tablet Take 325 mg by mouth every 6 (six) hours as needed for mild pain (pain score 1-3).     Blood Glucose Monitoring Suppl DEVI 1 each by Does not apply route in the morning, at noon, and at bedtime. May substitute to any manufacturer covered by patient's insurance. 1 each 0   colchicine 0.6 MG tablet Take 1 tablet (0.6 mg total) by mouth 2 (two) times daily. 14 tablet 0   doxycycline (VIBRAMYCIN) 100 MG capsule Take 1 capsule (100 mg total) by mouth 2 (two) times daily. 28 capsule 0   finasteride  (PROSCAR ) 5 MG tablet Take 5 mg by mouth daily.     gabapentin  (NEURONTIN ) 300 MG capsule Take 1 capsule (300 mg total) by mouth 3 (three) times daily. 90 capsule 3   HYDROcodone -acetaminophen  (NORCO/VICODIN) 5-325 MG tablet Take 1 tablet by mouth every 6 (six) hours as needed for moderate pain (pain score 4-6). 30 tablet 0   metFORMIN  (GLUCOPHAGE -XR) 500 MG 24 hr tablet Take 2 tablets (1,000 mg total) by mouth daily with breakfast. 180 tablet 3   methylPREDNISolone  (MEDROL  DOSEPAK) 4 MG TBPK tablet Taper over 6 days 21 tablet 0   Multiple Vitamins-Minerals (CENTRUM SILVER ULTRA MENS PO) Take 1 tablet by mouth daily.     pravastatin  (PRAVACHOL ) 20 MG tablet TAKE 1 TABLET BY MOUTH DAILY 90 tablet 3   senna-docusate (SENOKOT-S) 8.6-50 MG tablet Take 2 tablets by mouth daily. 60 tablet 3   tamsulosin (FLOMAX) 0.4 MG CAPS capsule Take 0.4 mg by mouth daily.     No current facility-administered medications on file prior to visit.    Allergies  Allergen Reactions   Penicillins    Social History   Socioeconomic History   Marital status: Single    Spouse name: Not on file   Number of children: Not on file   Years of education: Not on file   Highest education level: Not on file  Occupational History   Not on file  Tobacco Use   Smoking status: Never   Smokeless tobacco: Never  Vaping Use  Vaping status: Never Used  Substance and Sexual Activity   Alcohol use: No   Drug use: No   Sexual  activity: Yes    Comment: married, bus driver  Other Topics Concern   Not on file  Social History Narrative   Not on file   Social Drivers of Health   Financial Resource Strain: Low Risk  (05/01/2021)   Overall Financial Resource Strain (CARDIA)    Difficulty of Paying Living Expenses: Not hard at all  Food Insecurity: No Food Insecurity (05/01/2021)   Hunger Vital Sign    Worried About Running Out of Food in the Last Year: Never true    Ran Out of Food in the Last Year: Never true  Transportation Needs: No Transportation Needs (05/01/2021)   PRAPARE - Administrator, Civil Service (Medical): No    Lack of Transportation (Non-Medical): No  Physical Activity: Sufficiently Active (05/01/2021)   Exercise Vital Sign    Days of Exercise per Week: 5 days    Minutes of Exercise per Session: 40 min  Stress: No Stress Concern Present (05/01/2021)   [employer] of Occupational Health - Occupational Stress Questionnaire    Feeling of Stress : Not at all  Social Connections: Moderately Integrated (05/01/2021)   Social Connection and Isolation Panel    Frequency of Communication with Friends and Family: More than three times a week    Frequency of Social Gatherings with Friends and Family: Three times a week    Attends Religious Services: More than 4 times per year    Active Member of Clubs or Organizations: No    Attends Banker Meetings: Never    Marital Status: Married  Catering manager Violence: Not At Risk (05/01/2021)   Humiliation, Afraid, Rape, and Kick questionnaire    Fear of Current or Ex-Partner: No    Emotionally Abused: No    Physically Abused: No    Sexually Abused: No   Family History  Problem Relation Age of Onset   Heart disease Brother    Stroke Brother      Review of Systems  Musculoskeletal:  Positive for back pain.  All other systems reviewed and are negative.      Objective:   Physical Exam Vitals reviewed.  Constitutional:       General: He is not in acute distress.    Appearance: He is well-developed. He is not diaphoretic.  HENT:     Head: Normocephalic and atraumatic.  Neck:     Thyroid: No thyromegaly.   Cardiovascular:     Rate and Rhythm: Normal rate and regular rhythm.     Heart sounds: Normal heart sounds. No murmur heard.    No friction rub. No gallop.  Pulmonary:     Effort: Pulmonary effort is normal. No respiratory distress.     Breath sounds: Normal breath sounds. No wheezing or rales.  Chest:     Chest wall: No tenderness.  Abdominal:     General: Bowel sounds are normal.     Palpations: Abdomen is soft.   Musculoskeletal:     Right shoulder: Tenderness and crepitus present. No effusion. Decreased range of motion. Decreased strength.     Left shoulder: Tenderness and crepitus present. Decreased range of motion. Decreased strength.     Right hand: Tenderness and bony tenderness present. Decreased strength. Normal sensation.     Left hand: Tenderness and bony tenderness present. Decreased strength. Normal sensation.     Cervical back: Normal  range of motion and neck supple. No bony tenderness.     Right knee: No swelling or effusion. Normal range of motion. No medial joint line or lateral joint line tenderness.   Skin:    General: Skin is warm.     Coloration: Skin is not pale.     Findings: No erythema or rash.   Neurological:     Motor: No abnormal muscle tone.     Deep Tendon Reflexes: Reflexes are normal and symmetric.          Assessment & Plan:  Polyarthritis - Plan: CBC with Differential/Platelet, Comprehensive metabolic panel with GFR, Rheumatoid factor, ANA, Sedimentation rate  DDD (degenerative disc disease), cervical Patient continues to report burning pain in both arms, pain in his neck, weakness in his arms, numbness in his hands, decreased grip strength.  I believe the only diagnosis that would explain this is cervical spinal stenosis secondary to his degenerative  disc disease.  I will rule out rheumatologic conditions with a CBC a CMP a rheumatoid factor and ANA and a sed rate.  If all of these are negative I have recommended that he continue gabapentin  300 mg 3 times a day and await the results of an MRI of his cervical spine.  Will likely need to see neurosurgery

## 2024-06-02 ENCOUNTER — Encounter: Payer: Self-pay | Admitting: Family Medicine

## 2024-06-02 ENCOUNTER — Ambulatory Visit: Payer: Self-pay | Admitting: Family Medicine

## 2024-06-02 LAB — COMPREHENSIVE METABOLIC PANEL WITH GFR
AG Ratio: 1.5 (calc) (ref 1.0–2.5)
ALT: 16 U/L (ref 9–46)
AST: 16 U/L (ref 10–35)
Albumin: 4 g/dL (ref 3.6–5.1)
Alkaline phosphatase (APISO): 88 U/L (ref 35–144)
BUN/Creatinine Ratio: 30 (calc) — ABNORMAL HIGH (ref 6–22)
BUN: 27 mg/dL — ABNORMAL HIGH (ref 7–25)
CO2: 25 mmol/L (ref 20–32)
Calcium: 9 mg/dL (ref 8.6–10.3)
Chloride: 106 mmol/L (ref 98–110)
Creat: 0.91 mg/dL (ref 0.70–1.22)
Globulin: 2.6 g/dL (ref 1.9–3.7)
Glucose, Bld: 183 mg/dL — ABNORMAL HIGH (ref 65–99)
Potassium: 4.1 mmol/L (ref 3.5–5.3)
Sodium: 139 mmol/L (ref 135–146)
Total Bilirubin: 0.6 mg/dL (ref 0.2–1.2)
Total Protein: 6.6 g/dL (ref 6.1–8.1)
eGFR: 85 mL/min/{1.73_m2} (ref >=60)

## 2024-06-02 LAB — CBC WITH DIFFERENTIAL/PLATELET
Absolute Lymphocytes: 2035 {cells}/uL (ref 850–3900)
Absolute Monocytes: 710 {cells}/uL (ref 200–950)
Basophils Absolute: 30 {cells}/uL (ref 0–200)
Basophils Relative: 0.4 %
Eosinophils Absolute: 67 {cells}/uL (ref 15–500)
Eosinophils Relative: 0.9 %
HCT: 42.9 % (ref 38.5–50.0)
Hemoglobin: 14 g/dL (ref 13.2–17.1)
MCH: 28.4 pg (ref 27.0–33.0)
MCHC: 32.6 g/dL (ref 32.0–36.0)
MCV: 87 fL (ref 80.0–100.0)
MPV: 9.9 fL (ref 7.5–12.5)
Monocytes Relative: 9.6 %
Neutro Abs: 4558 {cells}/uL (ref 1500–7800)
Neutrophils Relative %: 61.6 %
Platelets: 306 10*3/uL (ref 140–400)
RBC: 4.93 10*6/uL (ref 4.20–5.80)
RDW: 13.2 % (ref 11.0–15.0)
Total Lymphocyte: 27.5 %
WBC: 7.4 10*3/uL (ref 3.8–10.8)

## 2024-06-02 LAB — ANA: Anti Nuclear Antibody (ANA): NEGATIVE

## 2024-06-02 LAB — RHEUMATOID FACTOR: Rheumatoid fact SerPl-aCnc: <10 [IU]/mL (ref <14)

## 2024-06-02 LAB — SEDIMENTATION RATE: Sed Rate: 11 mm/h (ref 0–20)

## 2024-06-02 NOTE — Progress Notes (Signed)
Mailbox Is full

## 2024-06-06 ENCOUNTER — Encounter: Admitting: Physical Medicine and Rehabilitation

## 2024-06-06 ENCOUNTER — Ambulatory Visit: Admitting: Physical Medicine and Rehabilitation

## 2024-06-06 DIAGNOSIS — R29898 Other symptoms and signs involving the musculoskeletal system: Secondary | ICD-10-CM | POA: Diagnosis not present

## 2024-06-06 DIAGNOSIS — G8929 Other chronic pain: Secondary | ICD-10-CM | POA: Diagnosis not present

## 2024-06-06 DIAGNOSIS — M25511 Pain in right shoulder: Secondary | ICD-10-CM | POA: Diagnosis not present

## 2024-06-06 DIAGNOSIS — M79601 Pain in right arm: Secondary | ICD-10-CM | POA: Diagnosis not present

## 2024-06-06 DIAGNOSIS — R202 Paresthesia of skin: Secondary | ICD-10-CM

## 2024-06-06 DIAGNOSIS — M25512 Pain in left shoulder: Secondary | ICD-10-CM

## 2024-06-06 DIAGNOSIS — M542 Cervicalgia: Secondary | ICD-10-CM | POA: Diagnosis not present

## 2024-06-06 NOTE — Progress Notes (Signed)
 Pain Scale   Average Pain 5  NCS- pain in right arm to fingers. Tingling and pain in left hand. Difficultly grasping.    Right handed

## 2024-06-08 ENCOUNTER — Other Ambulatory Visit: Payer: Self-pay

## 2024-06-08 ENCOUNTER — Telehealth: Payer: Self-pay

## 2024-06-08 DIAGNOSIS — M130 Polyarthritis, unspecified: Secondary | ICD-10-CM

## 2024-06-08 DIAGNOSIS — R200 Anesthesia of skin: Secondary | ICD-10-CM

## 2024-06-08 DIAGNOSIS — M2550 Pain in unspecified joint: Secondary | ICD-10-CM

## 2024-06-08 DIAGNOSIS — M5412 Radiculopathy, cervical region: Secondary | ICD-10-CM

## 2024-06-08 DIAGNOSIS — R202 Paresthesia of skin: Secondary | ICD-10-CM

## 2024-06-08 DIAGNOSIS — G8929 Other chronic pain: Secondary | ICD-10-CM

## 2024-06-08 MED ORDER — GABAPENTIN 300 MG PO CAPS: 300.0000 mg | ORAL_CAPSULE | Freq: Three times a day (TID) | ORAL | 3 refills | Status: AC

## 2024-06-08 NOTE — Telephone Encounter (Signed)
 Prescription Request  06/08/2024  LOV: 06/01/24  What is the name of the medication or equipment? gabapentin  (NEURONTIN ) 300 MG capsule [[id number]]   Have you contacted your pharmacy to request a refill? Yes   Which pharmacy would you like this sent to?  [facility] [id number] [place], [place] - [address] [facility] [address] [place] [address] Phone: [phone] Fax: [phone]    Patient notified that their request is being sent to the clinical staff for review and that they should receive a response within 2 business days.   Please advise at [facility] [phone] [

## 2024-06-12 NOTE — Progress Notes (Signed)
 Bryan Mccullough - 83 y.o. male MRN [id number]  Date of birth: December 05, 1941  Office Visit Note: Visit Date: 06/06/2024 PCP: Duanne Butler DASEN, MD Referred by: Addie Cordella Hamilton, MD  Subjective: Chief Complaint  Patient presents with   Left Arm - Pain, Tingling   Right Arm - Pain   HPI: Bryan Mccullough is a 83 y.o. male who comes in today at the request of Dr. JUDITHANN Hamilton Addie for evaluation and management of chronic, worsening and severe pain, numbness and tingling in the Right upper extremities.  Patient is Right hand dominant.  Interpreter present today however some level of language difficulty complicates history.  He has had ongoing pain in his neck shoulders bilateral arms and hands with paresthesias tingling and weakness.  In particular what I can gather is he has more pain in the right shoulder with shoulder movement and pain down the right arm into the hand.  No real paresthesias or tingling on the right.  On the left he gets tingling numbness into the left hand with weakness and dropping objects and reduced grip strength.  No specific injury.  Multiple other orthopedic complaints.  MRI of the cervical spine is on order and to be completed in a few days.  He has not had any prior neck surgery or cervical surgery.  He is diabetic with fairly good hemoglobin A1c without any history of polyneuropathy.  He does have symptoms in the legs at times.  He has tried therapy and medications without any real relief of symptoms.  He did have a prior electrodiagnostic study by Dr. Francis Bull at [facility] of the lower extremities in 2014 which were essentially normal.   I spent more than 30 minutes speaking face-to-face with the patient with 50% of the time in counseling and discussing coordination of care.      Review of Systems  Musculoskeletal:  Positive for joint pain and neck pain.  Neurological:  Positive for tingling and focal weakness.  All other systems reviewed and  are negative.  Otherwise per HPI.  Assessment & Plan: Visit Diagnoses:    ICD-10-CM   1. Paresthesia of skin  R20.2 NCV with EMG (electromyography)    2. Cervicalgia  M54.2     3. Left arm weakness  R29.898     4. Left hand weakness  R29.898     5. Right arm pain  M79.601     6. Chronic pain of both shoulders  M25.511    G89.29    M25.512        Plan: Impression: Complicated picture with multiple areas of joint pain and pain with movement but also tingling and paresthesia.  Electrodiagnostic study performed today.  The above electrodiagnostic study is ABNORMAL and reveals evidence of a severe left median nerve entrapment at the wrist (carpal tunnel syndrome) affecting sensory and motor components.   There is no significant electrodiagnostic evidence of any other focal nerve entrapment, brachial plexopathy or cervical radiculopathy.   Recommendations: 1.  Follow-up with referring physician. 2.  Continue current management of symptoms. 3.  Suggest surgical evaluation.  Meds & Orders: No orders of the defined types were placed in this encounter.   Orders Placed This Encounter  Procedures   NCV with EMG (electromyography)    Follow-up: Return for  G. Hamilton Addie, MD.   Procedures: No procedures performed  EMG & NCV Findings: Evaluation of the left median motor nerve showed prolonged distal onset latency (5.0 ms), reduced amplitude (2.9  mV), and decreased conduction velocity (Elbow-Wrist, 46 m/s).  The right median motor nerve showed reduced amplitude (3.1 mV).  The left ulnar sensory nerve showed prolonged distal peak latency (6.0 ms) and decreased conduction velocity (Wrist-5th Digit, 23 m/s).  All remaining nerves (as indicated in the following tables) were within normal limits.  Left vs. Right side comparison data for the median motor nerve indicates abnormal L-R latency difference (1.2 ms).  The ulnar sensory nerve indicates abnormal L-R latency difference (2.8 ms).  All  remaining left vs. right side differences were within normal limits.    *There was some technical artifact with the filtering during the sensory testing but there clearly seems to be very small sensory nerve action potential of the left median nerve and the left ulnar nerve sensory slowing is likely technical artifact as well.  All examined muscles (as indicated in the following table) showed no evidence of electrical instability.    Impression: The above electrodiagnostic study is ABNORMAL and reveals evidence of a severe left median nerve entrapment at the wrist (carpal tunnel syndrome) affecting sensory and motor components.   There is no significant electrodiagnostic evidence of any other focal nerve entrapment, brachial plexopathy or cervical radiculopathy.   Recommendations: 1.  Follow-up with referring physician. 2.  Continue current management of symptoms. 3.  Suggest surgical evaluation.  ___________________________ Prentice Masters FAAPMR Board Certified, American Board of Physical Medicine and Rehabilitation    Nerve Conduction Studies Anti Sensory Summary Table   Stim Site NR Peak (ms) Norm Peak (ms) P-T Amp (V) Norm P-T Amp Site1 Site2 Delta-P (ms) Dist (cm) Vel (m/s) Norm Vel (m/s)  Left Median Acr Palm Anti Sensory (2nd Digit)  32.4C  Wrist    0.8 <3.6 229.7 >10 Wrist Palm 0.0 0.0    Palm    0.8 <2.0 245.7         Right Median Acr Palm Anti Sensory (2nd Digit)  32.6C  Wrist    3.6 <3.6 17.3 >10 Wrist Palm 1.6 0.0    Palm    2.0 <2.0 6.8         Left Radial Anti Sensory (Base 1st Digit)  32.1C  Wrist    1.9 <3.1 51.7  Wrist Base 1st Digit 1.9 0.0    Right Radial Anti Sensory (Base 1st Digit)  32.4C  Wrist    2.2 <3.1 14.5  Wrist Base 1st Digit 2.2 0.0    Left Ulnar Anti Sensory (5th Digit)  33.2C  Wrist    *6.0 <3.7 20.9 >15.0 Wrist 5th Digit 6.0 14.0 *23 >38  Right Ulnar Anti Sensory (5th Digit)  32.8C  Wrist    3.2 <3.7 36.0 >15.0 Wrist 5th Digit 3.2 14.0 44  >38   Motor Summary Table   Stim Site NR Onset (ms) Norm Onset (ms) O-P Amp (mV) Norm O-P Amp Site1 Site2 Delta-0 (ms) Dist (cm) Vel (m/s) Norm Vel (m/s)  Left Median Motor (Abd Poll Brev)  32.7C  Wrist    *5.0 <4.2 *2.9 >5 Elbow Wrist 4.8 22.0 *46 >50  Elbow    9.8  2.9         Right Median Motor (Abd Poll Brev)  32.5C  Wrist    3.8 <4.2 *3.1 >5 Elbow Wrist 3.9 20.0 51 >50  Elbow    7.7  3.2         Left Ulnar Motor (Abd Dig Min)  32.9C  Wrist    3.1 <4.2 6.3 >3 B Elbow Wrist 3.4  20.0 59 >53  B Elbow    6.5  6.5  A Elbow B Elbow 1.5 10.0 67 >53  A Elbow    8.0  6.3         Right Ulnar Motor (Abd Dig Min)  32.7C  Wrist    3.2 <4.2 5.5 >3 B Elbow Wrist 3.4 20.0 59 >53  B Elbow    6.6  5.7  A Elbow B Elbow 1.5 10.0 67 >53  A Elbow    8.1  5.4          EMG   Side Muscle Nerve Root Ins Act Fibs Psw Amp Dur Poly Recrt Int Bruna Comment  Right Abd Poll Brev Median C8-T1 Nml Nml Nml Nml Nml 0 Nml Nml   Right 1stDorInt Ulnar C8-T1 Nml Nml Nml Nml Nml 0 Nml Nml   Right PronatorTeres Median C6-7 Nml Nml Nml Nml Nml 0 Nml Nml   Right Biceps Musculocut C5-6 Nml Nml Nml Nml Nml 0 Nml Nml   Right Deltoid Axillary C5-6 Nml Nml Nml Nml Nml 0 Nml Nml     Nerve Conduction Studies Anti Sensory Left/Right Comparison   Stim Site L Lat (ms) R Lat (ms) L-R Lat (ms) L Amp (V) R Amp (V) L-R Amp (%) Site1 Site2 L Vel (m/s) R Vel (m/s) L-R Vel (m/s)  Median Acr Palm Anti Sensory (2nd Digit)  32.4C  Wrist 0.8 3.6 2.8 229.7 17.3 92.5 Wrist Palm     Palm 0.8 2.0 1.2 245.7 6.8 97.2       Radial Anti Sensory (Base 1st Digit)  32.1C  Wrist 1.9 2.2 0.3 51.7 14.5 72.0 Wrist Base 1st Digit     Ulnar Anti Sensory (5th Digit)  33.2C  Wrist *6.0 3.2 *2.8 20.9 36.0 41.9 Wrist 5th Digit *23 44 21   Motor Left/Right Comparison   Stim Site L Lat (ms) R Lat (ms) L-R Lat (ms) L Amp (mV) R Amp (mV) L-R Amp (%) Site1 Site2 L Vel (m/s) R Vel (m/s) L-R Vel (m/s)  Median Motor (Abd Poll Brev)  32.7C  Wrist *5.0  3.8 *1.2 *2.9 *3.1 6.5 Elbow Wrist *46 51 5  Elbow 9.8 7.7 2.1 2.9 3.2 9.4       Ulnar Motor (Abd Dig Min)  32.9C  Wrist 3.1 3.2 0.1 6.3 5.5 12.7 B Elbow Wrist 59 59 0  B Elbow 6.5 6.6 0.1 6.5 5.7 12.3 A Elbow B Elbow 67 67 0  A Elbow 8.0 8.1 0.1 6.3 5.4 14.3          Waveforms:                     Clinical History: NERVE CONDUCTION STUDIES:   Nerve conduction studies were performed on both lower extremities. The distal motor latencies for the peroneal nerves and posterior tibial nerves were within normal limits bilaterally. The motor amplitudes for the peroneal nerves were normal on the right, low on the left, and normal for the posterior tibial nerves bilaterally. The nerve conduction velocities for the peroneal and posterior tibial nerves were normal bilaterally. The peroneal sensory latencies were normal laterally. The H reflex latencies were symmetric and normal.     EMG STUDIES:   EMG study was performed on the left lower extremity:   The tibialis anterior muscle reveals 2 to 5K motor units with decreased recruitment. No fibrillations or positive waves were seen. The peroneus tertius muscle reveals 2 to 4K motor units with decreased  recruitment. 1 plus fibrillations and positive waves were seen. The medial gastrocnemius muscle reveals 3 to 6K motor units with decreased recruitment. No fibrillations or positive waves were seen. The vastus lateralis muscle reveals 2 to 4K motor units with full recruitment. No fibrillations or positive waves were seen. The iliopsoas muscle reveals 2 to 4K motor units with full recruitment. No fibrillations or positive waves were seen. The biceps femoris muscle (long head) reveals 2 to 6K motor units with decreased recruitment. 2 plus fibrillations and positive waves were seen. The lumbosacral paraspinal muscles were tested at 3 levels, and revealed no abnormalities of insertional activity in the upper 2 levels tested. Complex repetitive  discharges were seen at the lower level. There was good relaxation.   EMG study was performed on the right lower extremity:   The tibialis anterior muscle reveals 2 to 4K motor units with full recruitment. No fibrillations or positive waves were seen. The peroneus tertius muscle reveals 2 to 4K motor units with full recruitment. No fibrillations or positive waves were seen. The medial gastrocnemius muscle reveals 1 to 3K motor units with full recruitment. No fibrillations or positive waves were seen. The vastus lateralis muscle reveals 2 to 4K motor units with full recruitment. No fibrillations or positive waves were seen. The iliopsoas muscle reveals 2 to 4K motor units with full recruitment. No fibrillations or positive waves were seen. The biceps femoris muscle (long head) reveals 2 to 4K motor units with full recruitment. No fibrillations or positive waves were seen. The lumbosacral paraspinal muscles were tested at 3 levels, and revealed no abnormalities of insertional activity at all 3 levels tested. There was good relaxation.       IMPRESSION:   Nerve conduction studies done on both lower extremities show no clear evidence of a peripheral neuropathy. The motor amplitudes of the left peroneal nerve was low, with a normal sensory latency. This finding is consistent with the EMG evaluation of the left lower extremity which shows evidence of a mixed chronic and acute L5 radiculopathy, and evidence of a chronic S1 radiculopathy. No abnormalities were noted on the right lower extremity. There is no evidence of a right lumbosacral radiculopathy.   KYM Francis Bull MD 03/03/2013 4:49 PM   He reports that he has never smoked. He has never used smokeless tobacco.  Recent Labs    07/06/23 1200 10/08/23 1228 03/03/24 1233 05/16/24 1457  HGBA1C 6.5* 6.9* 6.8*  --   LABURIC  --   --   --  5.8    Objective:  VS:  HT:    WT:   BMI:     BP:   HR: bpm  TEMP: ( )  RESP:  Physical  Exam Vitals and nursing note reviewed.  Constitutional:      General: He is not in acute distress.    Appearance: Normal appearance. He is well-developed.  HENT:     Head: Normocephalic and atraumatic.  Eyes:     Conjunctiva/sclera: Conjunctivae normal.     Pupils: Pupils are equal, round, and reactive to light.  Cardiovascular:     Rate and Rhythm: Normal rate.     Pulses: Normal pulses.     Heart sounds: Normal heart sounds.  Pulmonary:     Effort: Pulmonary effort is normal. No respiratory distress.  Musculoskeletal:        General: Tenderness present.     Cervical back: Normal range of motion and neck supple. No rigidity.     Right  lower leg: No edema.     Left lower leg: No edema.     Comments: Inspection reveals no atrophy of the bilateral APB or FDI or hand intrinsics.  Some flattening of the left APB.  There is no swelling, color changes, allodynia or dystrophic changes. There is 5 out of 5 strength in the bilateral wrist extension, finger abduction and long finger flexion.  There is impaired sensation to light touch in the left median nerve distribution. There is a negative Froment's test bilaterally. There is a negative Tinel's test at the bilateral wrist and elbow. There is a positive Phalen's test on the left. There is a negative Hoffmann's test bilaterally.  Skin:    General: Skin is warm and dry.     Findings: No erythema or rash.  Neurological:     General: No focal deficit present.     Mental Status: He is alert and oriented to person, place, and time.     Cranial Nerves: No cranial nerve deficit.     Sensory: Sensory deficit present.     Motor: No weakness or abnormal muscle tone.     Coordination: Coordination normal.     Gait: Gait abnormal.  Psychiatric:        Mood and Affect: Mood normal.        Behavior: Behavior normal.        Thought Content: Thought content normal.     Ortho Exam  Imaging: No results found.  Past Medical/Family/Surgical/Social  History: Medications & Allergies reviewed per EMR, new medications updated. Patient Active Problem List   Diagnosis Date Noted   Diabetes mellitus type 2 in nonobese (HCC)    HLD (hyperlipidemia)    BPH with elevated PSA    Esophageal stricture    Erectile dysfunction    Lumbosacral root lesions, not elsewhere classified 03/03/2013   Past Medical History:  Diagnosis Date   BPH with elevated PSA    Diabetes mellitus type 2 in nonobese [facility])    Diverticulosis    Erectile dysfunction    Esophageal stricture    GERD (gastroesophageal reflux disease)    Hiatal hernia    HLD (hyperlipidemia)    Family History  Problem Relation Age of Onset   Heart disease Brother    Stroke Brother    Past Surgical History:  Procedure Laterality Date   PROSTATE SURGERY     Social History   Occupational History   Not on file  Tobacco Use   Smoking status: Never   Smokeless tobacco: Never  Vaping Use   Vaping status: Never Used  Substance and Sexual Activity   Alcohol use: No   Drug use: No   Sexual activity: Yes    Comment: married, bus driver

## 2024-06-12 NOTE — Procedures (Signed)
 EMG & NCV Findings: Evaluation of the left median motor nerve showed prolonged distal onset latency (5.0 ms), reduced amplitude (2.9 mV), and decreased conduction velocity (Elbow-Wrist, 46 m/s).  The right median motor nerve showed reduced amplitude (3.1 mV).  The left ulnar sensory nerve showed prolonged distal peak latency (6.0 ms) and decreased conduction velocity (Wrist-5th Digit, 23 m/s).  All remaining nerves (as indicated in the following tables) were within normal limits.  Left vs. Right side comparison data for the median motor nerve indicates abnormal L-R latency difference (1.2 ms).  The ulnar sensory nerve indicates abnormal L-R latency difference (2.8 ms).  All remaining left vs. right side differences were within normal limits.    *There was some technical artifact with the filtering during the sensory testing but there clearly seems to be very small sensory nerve action potential of the left median nerve and the left ulnar nerve sensory slowing is likely technical artifact as well.  All examined muscles (as indicated in the following table) showed no evidence of electrical instability.    Impression: The above electrodiagnostic study is ABNORMAL and reveals evidence of a severe left median nerve entrapment at the wrist (carpal tunnel syndrome) affecting sensory and motor components.   There is no significant electrodiagnostic evidence of any other focal nerve entrapment, brachial plexopathy or cervical radiculopathy.   Recommendations: 1.  Follow-up with referring physician. 2.  Continue current management of symptoms. 3.  Suggest surgical evaluation.  ___________________________ Prentice Masters FAAPMR Board Certified, American Board of Physical Medicine and Rehabilitation    Nerve Conduction Studies Anti Sensory Summary Table   Stim Site NR Peak (ms) Norm Peak (ms) P-T Amp (V) Norm P-T Amp Site1 Site2 Delta-P (ms) Dist (cm) Vel (m/s) Norm Vel (m/s)  Left Median Acr Palm Anti  Sensory (2nd Digit)  32.4C  Wrist    0.8 <3.6 229.7 >10 Wrist Palm 0.0 0.0    Palm    0.8 <2.0 245.7         Right Median Acr Palm Anti Sensory (2nd Digit)  32.6C  Wrist    3.6 <3.6 17.3 >10 Wrist Palm 1.6 0.0    Palm    2.0 <2.0 6.8         Left Radial Anti Sensory (Base 1st Digit)  32.1C  Wrist    1.9 <3.1 51.7  Wrist Base 1st Digit 1.9 0.0    Right Radial Anti Sensory (Base 1st Digit)  32.4C  Wrist    2.2 <3.1 14.5  Wrist Base 1st Digit 2.2 0.0    Left Ulnar Anti Sensory (5th Digit)  33.2C  Wrist    *6.0 <3.7 20.9 >15.0 Wrist 5th Digit 6.0 14.0 *23 >38  Right Ulnar Anti Sensory (5th Digit)  32.8C  Wrist    3.2 <3.7 36.0 >15.0 Wrist 5th Digit 3.2 14.0 44 >38   Motor Summary Table   Stim Site NR Onset (ms) Norm Onset (ms) O-P Amp (mV) Norm O-P Amp Site1 Site2 Delta-0 (ms) Dist (cm) Vel (m/s) Norm Vel (m/s)  Left Median Motor (Abd Poll Brev)  32.7C  Wrist    *5.0 <4.2 *2.9 >5 Elbow Wrist 4.8 22.0 *46 >50  Elbow    9.8  2.9         Right Median Motor (Abd Poll Brev)  32.5C  Wrist    3.8 <4.2 *3.1 >5 Elbow Wrist 3.9 20.0 51 >50  Elbow    7.7  3.2         Left  Ulnar Motor (Abd Dig Min)  32.9C  Wrist    3.1 <4.2 6.3 >3 B Elbow Wrist 3.4 20.0 59 >53  B Elbow    6.5  6.5  A Elbow B Elbow 1.5 10.0 67 >53  A Elbow    8.0  6.3         Right Ulnar Motor (Abd Dig Min)  32.7C  Wrist    3.2 <4.2 5.5 >3 B Elbow Wrist 3.4 20.0 59 >53  B Elbow    6.6  5.7  A Elbow B Elbow 1.5 10.0 67 >53  A Elbow    8.1  5.4          EMG   Side Muscle Nerve Root Ins Act Fibs Psw Amp Dur Poly Recrt Int Bruna Comment  Right Abd Poll Brev Median C8-T1 Nml Nml Nml Nml Nml 0 Nml Nml   Right 1stDorInt Ulnar C8-T1 Nml Nml Nml Nml Nml 0 Nml Nml   Right PronatorTeres Median C6-7 Nml Nml Nml Nml Nml 0 Nml Nml   Right Biceps Musculocut C5-6 Nml Nml Nml Nml Nml 0 Nml Nml   Right Deltoid Axillary C5-6 Nml Nml Nml Nml Nml 0 Nml Nml     Nerve Conduction Studies Anti Sensory Left/Right Comparison   Stim Site L  Lat (ms) R Lat (ms) L-R Lat (ms) L Amp (V) R Amp (V) L-R Amp (%) Site1 Site2 L Vel (m/s) R Vel (m/s) L-R Vel (m/s)  Median Acr Palm Anti Sensory (2nd Digit)  32.4C  Wrist 0.8 3.6 2.8 229.7 17.3 92.5 Wrist Palm     Palm 0.8 2.0 1.2 245.7 6.8 97.2       Radial Anti Sensory (Base 1st Digit)  32.1C  Wrist 1.9 2.2 0.3 51.7 14.5 72.0 Wrist Base 1st Digit     Ulnar Anti Sensory (5th Digit)  33.2C  Wrist *6.0 3.2 *2.8 20.9 36.0 41.9 Wrist 5th Digit *23 44 21   Motor Left/Right Comparison   Stim Site L Lat (ms) R Lat (ms) L-R Lat (ms) L Amp (mV) R Amp (mV) L-R Amp (%) Site1 Site2 L Vel (m/s) R Vel (m/s) L-R Vel (m/s)  Median Motor (Abd Poll Brev)  32.7C  Wrist *5.0 3.8 *1.2 *2.9 *3.1 6.5 Elbow Wrist *46 51 5  Elbow 9.8 7.7 2.1 2.9 3.2 9.4       Ulnar Motor (Abd Dig Min)  32.9C  Wrist 3.1 3.2 0.1 6.3 5.5 12.7 B Elbow Wrist 59 59 0  B Elbow 6.5 6.6 0.1 6.5 5.7 12.3 A Elbow B Elbow 67 67 0  A Elbow 8.0 8.1 0.1 6.3 5.4 14.3          Waveforms:

## 2024-06-14 ENCOUNTER — Ambulatory Visit
Admission: RE | Admit: 2024-06-14 | Discharge: 2024-06-14 | Disposition: A | Discharge status: Home / Self Care | Source: Ambulatory Visit | Attending: Family Medicine | Admitting: Family Medicine

## 2024-06-14 DIAGNOSIS — M4802 Spinal stenosis, cervical region: Secondary | ICD-10-CM | POA: Diagnosis not present

## 2024-06-14 DIAGNOSIS — R29898 Other symptoms and signs involving the musculoskeletal system: Secondary | ICD-10-CM

## 2024-06-14 DIAGNOSIS — M4722 Other spondylosis with radiculopathy, cervical region: Secondary | ICD-10-CM | POA: Diagnosis not present

## 2024-06-14 DIAGNOSIS — R202 Paresthesia of skin: Secondary | ICD-10-CM

## 2024-06-14 DIAGNOSIS — M5030 Other cervical disc degeneration, unspecified cervical region: Secondary | ICD-10-CM

## 2024-06-14 DIAGNOSIS — M5412 Radiculopathy, cervical region: Secondary | ICD-10-CM

## 2024-06-15 ENCOUNTER — Ambulatory Visit: Payer: Self-pay | Admitting: Family Medicine

## 2024-06-15 NOTE — Progress Notes (Signed)
 Letter sent to pt. Home address on file w/ results.

## 2024-06-16 ENCOUNTER — Telehealth: Payer: Self-pay

## 2024-06-16 NOTE — Telephone Encounter (Signed)
 Pt came into office stating that he is still having numbness, tingling, and swelling in his hands. Pt stated that he has taken the gabapentin  and it does not work, he is still experiencing swelling, and pain. Pt came into ask if  pcp would send in this med meloxicam  (MOBIC ) 15 MG tablet instead. Pt stated that he feels this med works better for him. Pt would like this sent in asap, as he has been in a lot of pain and does not want to go into the weekend with this pain. Pt would like a cb from the nurse to let him know if pcp has sent in this med to pharmacy please.  MED: meloxicam  (MOBIC ) 15 MG tablet  PHARMACY: [facility] [id number] [place], [place] - [address] [facility] [address] [place], [place] [address] Phone: [phone]  Fax: [phone]  LOV: 06/01/24  CB#: [phone]

## 2024-06-19 ENCOUNTER — Encounter: Payer: Self-pay | Admitting: Physical Medicine and Rehabilitation

## 2024-06-19 ENCOUNTER — Other Ambulatory Visit: Payer: Self-pay | Admitting: Family Medicine

## 2024-06-19 MED ORDER — MELOXICAM 15 MG PO TABS: 15.0000 mg | ORAL_TABLET | Freq: Every day | ORAL | 5 refills | Status: AC

## 2024-06-20 ENCOUNTER — Encounter: Payer: Self-pay | Admitting: Urology

## 2024-06-23 ENCOUNTER — Other Ambulatory Visit: Payer: Self-pay

## 2024-06-23 DIAGNOSIS — R200 Anesthesia of skin: Secondary | ICD-10-CM

## 2024-06-23 DIAGNOSIS — R51 Headache with orthostatic component, not elsewhere classified: Secondary | ICD-10-CM

## 2024-06-23 DIAGNOSIS — M5030 Other cervical disc degeneration, unspecified cervical region: Secondary | ICD-10-CM

## 2024-06-23 DIAGNOSIS — G8929 Other chronic pain: Secondary | ICD-10-CM

## 2024-06-23 DIAGNOSIS — M5412 Radiculopathy, cervical region: Secondary | ICD-10-CM

## 2024-06-23 DIAGNOSIS — M12811 Other specific arthropathies, not elsewhere classified, right shoulder: Secondary | ICD-10-CM

## 2024-06-23 NOTE — Telephone Encounter (Signed)
 Pt. Contacted w/ the help of interpreter services. Pt. Was explained to the results of last MRI and the plan of treatment as well as medication prescribed. He stated he is visiting orthopaedics as well and will wait for scheduling w/ neurosurgery for further evaluation.

## 2024-06-28 ENCOUNTER — Encounter: Payer: Self-pay | Admitting: Orthopedic Surgery

## 2024-06-28 ENCOUNTER — Ambulatory Visit: Admitting: Orthopedic Surgery

## 2024-06-28 ENCOUNTER — Other Ambulatory Visit: Payer: Self-pay | Admitting: Family Medicine

## 2024-06-28 DIAGNOSIS — M5412 Radiculopathy, cervical region: Secondary | ICD-10-CM

## 2024-06-28 DIAGNOSIS — M542 Cervicalgia: Secondary | ICD-10-CM

## 2024-06-28 DIAGNOSIS — M5030 Other cervical disc degeneration, unspecified cervical region: Secondary | ICD-10-CM

## 2024-06-28 DIAGNOSIS — M79601 Pain in right arm: Secondary | ICD-10-CM | POA: Diagnosis not present

## 2024-06-28 NOTE — Progress Notes (Signed)
 Office Visit Note   Patient: Bryan Mccullough           Date of Birth: 1941/07/27           MRN: [id number] Visit Date: 06/28/2024 Requested by: Duanne Butler DASEN, MD [address] [place],  [place] [address] PCP: Duanne Butler DASEN, MD  Subjective: Chief Complaint  Patient presents with   Other    EMG/NCV review    HPI: Bryan Mccullough is a 83 y.o. male who presents to the office reporting continued left arm radicular symptoms as well as right arm radicular symptoms.  He has had an MRI scan which shows C5-6 degenerative disc disease as well as severe bilateral foraminal stenosis at C6-7.  His nerve conduction study shows severe carpal tunnel syndrome of the left hand.  Patient does describe numbness and tingling in digits 1-3 as well as diminished hand grip function and dexterity..                ROS: All systems reviewed are negative as they relate to the chief complaint within the history of present illness.  Patient denies fevers or chills.  Assessment & Plan: Visit Diagnoses:  1. Right arm pain   2. 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  Easiest way to treat this would be carpal tunnel release.  Cervical spine injections may be helpful temporarily but at the end of the day patient does have severe foraminal stenosis which is likely symptomatic.  The injection will not really create any more space for those nerves.  Carpal tunnel release could at least give him some relief from his hand symptoms.  He wants to try a neck injection first.  He will call me if he wants to schedule carpal tunnel release.  Follow-up with Dr. Eldonna for cervical spine ESI and follow-up with us  as needed.  Follow-Up Instructions: No follow-ups on file.   Orders:  Orders Placed This Encounter  Procedures   Ambulatory referral to Physical Medicine Rehab   No orders of the defined types were placed in this encounter.     Procedures: No  procedures performed   Clinical Data: No additional findings.  Objective: Vital Signs: There were no vitals taken for this visit.  Physical Exam:  Constitutional: Patient appears well-developed HEENT:  Head: Normocephalic Eyes:EOM are normal Neck: Normal range of motion Cardiovascular: Normal rate Pulmonary/chest: Effort normal Neurologic: Patient is alert Skin: Skin is warm Psychiatric: Patient has normal mood and affect  Ortho Exam: Ortho exam is essentially unchanged.  Does have diminished grip strength on the left compared to the right.  EPL FPL interosseous strength is intact bilaterally.  Radial pulses intact.  Negative Tinel's cubital tunnel at the elbow.  Cervical spine range of motion mildly painful.  No other masses lymphadenopathy or skin changes noted in that wrist region.  Specialty Comments:  NERVE CONDUCTION STUDIES:   Nerve conduction studies were performed on both lower extremities. The distal motor latencies for the peroneal nerves and posterior tibial nerves were within normal limits bilaterally. The motor amplitudes for the peroneal nerves were normal on the right, low on the left, and normal for the posterior tibial nerves bilaterally. The nerve conduction velocities for the peroneal and posterior tibial nerves were normal bilaterally. The peroneal sensory latencies were normal laterally. The H reflex latencies were symmetric and normal.     EMG STUDIES:   EMG study was performed  on the left lower extremity:   The tibialis anterior muscle reveals 2 to 5K motor units with decreased recruitment. No fibrillations or positive waves were seen. The peroneus tertius muscle reveals 2 to 4K motor units with decreased recruitment. 1 plus fibrillations and positive waves were seen. The medial gastrocnemius muscle reveals 3 to 6K motor units with decreased recruitment. No fibrillations or positive waves were seen. The vastus lateralis muscle reveals 2 to 4K motor units  with full recruitment. No fibrillations or positive waves were seen. The iliopsoas muscle reveals 2 to 4K motor units with full recruitment. No fibrillations or positive waves were seen. The biceps femoris muscle (long head) reveals 2 to 6K motor units with decreased recruitment. 2 plus fibrillations and positive waves were seen. The lumbosacral paraspinal muscles were tested at 3 levels, and revealed no abnormalities of insertional activity in the upper 2 levels tested. Complex repetitive discharges were seen at the lower level. There was good relaxation.   EMG study was performed on the right lower extremity:   The tibialis anterior muscle reveals 2 to 4K motor units with full recruitment. No fibrillations or positive waves were seen. The peroneus tertius muscle reveals 2 to 4K motor units with full recruitment. No fibrillations or positive waves were seen. The medial gastrocnemius muscle reveals 1 to 3K motor units with full recruitment. No fibrillations or positive waves were seen. The vastus lateralis muscle reveals 2 to 4K motor units with full recruitment. No fibrillations or positive waves were seen. The iliopsoas muscle reveals 2 to 4K motor units with full recruitment. No fibrillations or positive waves were seen. The biceps femoris muscle (long head) reveals 2 to 4K motor units with full recruitment. No fibrillations or positive waves were seen. The lumbosacral paraspinal muscles were tested at 3 levels, and revealed no abnormalities of insertional activity at all 3 levels tested. There was good relaxation.       IMPRESSION:   Nerve conduction studies done on both lower extremities show no clear evidence of a peripheral neuropathy. The motor amplitudes of the left peroneal nerve was low, with a normal sensory latency. This finding is consistent with the EMG evaluation of the left lower extremity which shows evidence of a mixed chronic and acute L5 radiculopathy, and evidence of a  chronic S1 radiculopathy. No abnormalities were noted on the right lower extremity. There is no evidence of a right lumbosacral radiculopathy.   KYM Francis Bull MD 03/03/2013 4:49 PM  Imaging: No results found.   PMFS History: Patient Active Problem List   Diagnosis Date Noted   Diabetes mellitus type 2 in nonobese (HCC)    HLD (hyperlipidemia)    BPH with elevated PSA    Esophageal stricture    Erectile dysfunction    Lumbosacral root lesions, not elsewhere classified 03/03/2013   Past Medical History:  Diagnosis Date   BPH with elevated PSA    Diabetes mellitus type 2 in nonobese [facility])    Diverticulosis    Erectile dysfunction    Esophageal stricture    GERD (gastroesophageal reflux disease)    Hiatal hernia    HLD (hyperlipidemia)     Family History  Problem Relation Age of Onset   Heart disease Brother    Stroke Brother     Past Surgical History:  Procedure Laterality Date   PROSTATE SURGERY     Social History   Occupational History   Not on file  Tobacco Use   Smoking status: Never  Smokeless tobacco: Never  Vaping Use   Vaping status: Never Used  Substance and Sexual Activity   Alcohol use: No   Drug use: No   Sexual activity: Yes    Comment: married, bus driver

## 2024-06-29 ENCOUNTER — Ambulatory Visit
Admission: RE | Admit: 2024-06-29 | Discharge: 2024-06-29 | Disposition: A | Discharge status: Home / Self Care | Source: Ambulatory Visit | Attending: Urology | Admitting: Urology

## 2024-06-29 DIAGNOSIS — R972 Elevated prostate specific antigen [PSA]: Secondary | ICD-10-CM | POA: Diagnosis not present

## 2024-06-29 MED ORDER — GADOPICLENOL 0.5 MMOL/ML IV SOLN
7.0000 mL | Freq: Once | INTRAVENOUS | Status: AC | PRN
  Administered 2024-06-29: 7 mL via INTRAVENOUS

## 2024-07-18 ENCOUNTER — Ambulatory Visit: Admitting: Physician Assistant

## 2024-07-22 ENCOUNTER — Other Ambulatory Visit

## 2024-08-10 ENCOUNTER — Other Ambulatory Visit: Payer: Self-pay

## 2024-08-10 ENCOUNTER — Ambulatory Visit (INDEPENDENT_AMBULATORY_CARE_PROVIDER_SITE_OTHER): Admitting: Physical Medicine and Rehabilitation

## 2024-08-10 VITALS — BP 126/66 | HR 81

## 2024-08-10 DIAGNOSIS — M5412 Radiculopathy, cervical region: Secondary | ICD-10-CM

## 2024-08-10 MED ORDER — METHYLPREDNISOLONE ACETATE 40 MG/ML IJ SUSP
40.0000 mg | Freq: Once | INTRAMUSCULAR | Status: AC
  Administered 2024-08-10: 40 mg

## 2024-08-10 NOTE — Progress Notes (Signed)
 Pain Scale   Average Pain 5 Patient advising he has chronic lower back pain radiating bilaterally too arms and fingers, pain worse at night.        +Driver, -BT, -Dye Allergies.

## 2024-08-10 NOTE — Procedures (Signed)
 Cervical Epidural Steroid Injection - Interlaminar Approach with Fluoroscopic Guidance  Patient: Bryan Mccullough      Date of Birth: 05/25/1941 MRN: [id number] PCP: Duanne Butler DASEN, MD      Visit Date: 08/10/2024   Universal Protocol:    Date/Time: 09/04/251:09 PM  Consent Given By: the patient  Position: PRONE  Additional Comments: Vital signs were monitored before and after the procedure. Patient was prepped and draped in the usual sterile fashion. The correct patient, procedure, and site was verified.   Injection Procedure Details:   Procedure diagnoses: Cervical radiculopathy [M54.12]    Meds Administered:  Meds ordered this encounter  Medications   methylPREDNISolone  acetate (DEPO-MEDROL ) injection 40 mg     Laterality: Right  Location/Site: C7-T1  Needle: 3.5 in., 20 ga. Tuohy  Needle Placement: Paramedian epidural space  Findings:  -Comments: Excellent flow of contrast into the epidural space.  Procedure Details: Using a paramedian approach from the side mentioned above, the region overlying the inferior lamina was localized under fluoroscopic visualization and the soft tissues overlying this structure were infiltrated with 4 ml. of 1% Lidocaine  without Epinephrine. A # 20 gauge, Tuohy needle was inserted into the epidural space using a paramedian approach.  The epidural space was localized using loss of resistance along with contralateral oblique bi-planar fluoroscopic views.  After negative aspirate for air, blood, and CSF, a 2 ml. volume of Isovue -250 was injected into the epidural space and the flow of contrast was observed. Radiographs were obtained for documentation purposes.   The injectate was administered into the level noted above.  Additional Comments:  The patient tolerated the procedure well Dressing: 2 x 2 sterile gauze and Band-Aid    Post-procedure details: Patient was observed during the procedure. Post-procedure instructions were  reviewed.  Patient left the clinic in stable condition.

## 2024-08-10 NOTE — Progress Notes (Signed)
 Bryan Mccullough - 83 y.o. male MRN [id number]  Date of birth: 09-Oct-1941  Office Visit Note: Visit Date: 08/10/2024 PCP: Duanne Butler DASEN, MD Referred by: Duanne Butler DASEN, MD  Subjective: Chief Complaint  Patient presents with   Neck - Pain   HPI:  Bryan Mccullough is a 83 y.o. male who comes in today at the request of Dr. JUDITHANN Glendia Hutchinson for planned Right C7-T1 Cervical Interlaminar epidural steroid injection with fluoroscopic guidance.  The patient has failed conservative care including home exercise, medications, time and activity modification.  This injection will be diagnostic and hopefully therapeutic.  Please see requesting physician notes for further details and justification.   ROS Otherwise per HPI.  Assessment & Plan: Visit Diagnoses:    ICD-10-CM   1. Cervical radiculopathy  M54.12 XR C-ARM NO REPORT    Epidural Steroid injection    methylPREDNISolone  acetate (DEPO-MEDROL ) injection 40 mg      Plan: No additional findings.   Meds & Orders:  Meds ordered this encounter  Medications   methylPREDNISolone  acetate (DEPO-MEDROL ) injection 40 mg    Orders Placed This Encounter  Procedures   XR C-ARM NO REPORT   Epidural Steroid injection    Follow-up: Return for visit to requesting provider as needed.   Procedures: No procedures performed  Cervical Epidural Steroid Injection - Interlaminar Approach with Fluoroscopic Guidance  Patient: Bryan Mccullough      Date of Birth: [birth year] MRN: [id number] PCP: Duanne Butler DASEN, MD      Visit Date: 08/10/2024   Universal Protocol:    Date/Time: 09/04/251:09 PM  Consent Given By: the patient  Position: PRONE  Additional Comments: Vital signs were monitored before and after the procedure. Patient was prepped and draped in the usual sterile fashion. The correct patient, procedure, and site was verified.   Injection Procedure Details:   Procedure diagnoses: Cervical radiculopathy [M54.12]    Meds  Administered:  Meds ordered this encounter  Medications   methylPREDNISolone  acetate (DEPO-MEDROL ) injection 40 mg     Laterality: Right  Location/Site: C7-T1  Needle: 3.5 in., 20 ga. Tuohy  Needle Placement: Paramedian epidural space  Findings:  -Comments: Excellent flow of contrast into the epidural space.  Procedure Details: Using a paramedian approach from the side mentioned above, the region overlying the inferior lamina was localized under fluoroscopic visualization and the soft tissues overlying this structure were infiltrated with 4 ml. of 1% Lidocaine  without Epinephrine. A # 20 gauge, Tuohy needle was inserted into the epidural space using a paramedian approach.  The epidural space was localized using loss of resistance along with contralateral oblique bi-planar fluoroscopic views.  After negative aspirate for air, blood, and CSF, a 2 ml. volume of Isovue -250 was injected into the epidural space and the flow of contrast was observed. Radiographs were obtained for documentation purposes.   The injectate was administered into the level noted above.  Additional Comments:  The patient tolerated the procedure well Dressing: 2 x 2 sterile gauze and Band-Aid    Post-procedure details: Patient was observed during the procedure. Post-procedure instructions were reviewed.  Patient left the clinic in stable condition.   Clinical History: NERVE CONDUCTION STUDIES:   Nerve conduction studies were performed on both lower extremities. The distal motor latencies for the peroneal nerves and posterior tibial nerves were within normal limits bilaterally. The motor amplitudes for the peroneal nerves were normal on the right, low on the left, and normal for the posterior tibial nerves bilaterally. The nerve conduction  velocities for the peroneal and posterior tibial nerves were normal bilaterally. The peroneal sensory latencies were normal laterally. The H reflex latencies were symmetric  and normal.     EMG STUDIES:   EMG study was performed on the left lower extremity:   The tibialis anterior muscle reveals 2 to 5K motor units with decreased recruitment. No fibrillations or positive waves were seen. The peroneus tertius muscle reveals 2 to 4K motor units with decreased recruitment. 1 plus fibrillations and positive waves were seen. The medial gastrocnemius muscle reveals 3 to 6K motor units with decreased recruitment. No fibrillations or positive waves were seen. The vastus lateralis muscle reveals 2 to 4K motor units with full recruitment. No fibrillations or positive waves were seen. The iliopsoas muscle reveals 2 to 4K motor units with full recruitment. No fibrillations or positive waves were seen. The biceps femoris muscle (long head) reveals 2 to 6K motor units with decreased recruitment. 2 plus fibrillations and positive waves were seen. The lumbosacral paraspinal muscles were tested at 3 levels, and revealed no abnormalities of insertional activity in the upper 2 levels tested. Complex repetitive discharges were seen at the lower level. There was good relaxation.   EMG study was performed on the right lower extremity:   The tibialis anterior muscle reveals 2 to 4K motor units with full recruitment. No fibrillations or positive waves were seen. The peroneus tertius muscle reveals 2 to 4K motor units with full recruitment. No fibrillations or positive waves were seen. The medial gastrocnemius muscle reveals 1 to 3K motor units with full recruitment. No fibrillations or positive waves were seen. The vastus lateralis muscle reveals 2 to 4K motor units with full recruitment. No fibrillations or positive waves were seen. The iliopsoas muscle reveals 2 to 4K motor units with full recruitment. No fibrillations or positive waves were seen. The biceps femoris muscle (long head) reveals 2 to 4K motor units with full recruitment. No fibrillations or positive waves were seen. The  lumbosacral paraspinal muscles were tested at 3 levels, and revealed no abnormalities of insertional activity at all 3 levels tested. There was good relaxation.       IMPRESSION:   Nerve conduction studies done on both lower extremities show no clear evidence of a peripheral neuropathy. The motor amplitudes of the left peroneal nerve was low, with a normal sensory latency. This finding is consistent with the EMG evaluation of the left lower extremity which shows evidence of a mixed chronic and acute L5 radiculopathy, and evidence of a chronic S1 radiculopathy. No abnormalities were noted on the right lower extremity. There is no evidence of a right lumbosacral radiculopathy.   KYM Francis Bull MD 03/03/2013 4:49 PM     Objective:  VS:  HT:    WT:   BMI:     BP:126/66  HR:81bpm  TEMP: ( )  RESP:  Physical Exam Vitals and nursing note reviewed.  Constitutional:      General: He is not in acute distress.    Appearance: Normal appearance. He is not ill-appearing.  HENT:     Head: Normocephalic and atraumatic.     Right Ear: External ear normal.     Left Ear: External ear normal.  Eyes:     Extraocular Movements: Extraocular movements intact.  Cardiovascular:     Rate and Rhythm: Normal rate.     Pulses: Normal pulses.  Abdominal:     General: There is no distension.     Palpations: Abdomen is soft.  Musculoskeletal:        General: No signs of injury.     Cervical back: Neck supple. Tenderness present. No rigidity.     Right lower leg: No edema.     Left lower leg: No edema.     Comments: Patient has good strength in the upper extremities with 5 out of 5 strength in wrist extension long finger flexion APB.  No intrinsic hand muscle atrophy.  Negative Hoffmann's test.  Lymphadenopathy:     Cervical: No cervical adenopathy.  Skin:    Findings: No erythema or rash.  Neurological:     General: No focal deficit present.     Mental Status: He is alert and oriented to person,  place, and time.     Sensory: No sensory deficit.     Motor: No weakness or abnormal muscle tone.     Coordination: Coordination normal.  Psychiatric:        Mood and Affect: Mood normal.        Behavior: Behavior normal.      Imaging: No results found.

## 2024-08-28 ENCOUNTER — Encounter: Payer: Self-pay | Admitting: Orthopedic Surgery

## 2024-08-28 ENCOUNTER — Ambulatory Visit: Admitting: Orthopedic Surgery

## 2024-08-28 DIAGNOSIS — M5412 Radiculopathy, cervical region: Secondary | ICD-10-CM

## 2024-08-28 NOTE — Progress Notes (Unsigned)
 Office Visit Note   Patient: Bryan Mccullough           Date of Birth: 23-Jul-1941           MRN: [id number] Visit Date: 08/28/2024 Requested by: Bryan Butler DASEN, Mccullough [address] [place],  [place] [address] PCP: Bryan Butler DASEN, Mccullough  Subjective: Chief Complaint  Patient presents with   Neck - Pain, Follow-up    HPI: Bryan Mccullough is a 83 y.o. male who presents to the office reporting for follow-up after cervical spine ESI.  That was performed 08/10/2024.  Got mild relief from the injection.  Still has some pain in the joints of his fingers on the right and left-hand side.  Has a little popping in the right shoulder which likely has rotator cuff pathology but she does not want to do anything about in terms of surgery.  Does take meloxicam  with relief..                ROS: All systems reviewed are negative as they relate to the chief complaint within the history of present illness.  Patient denies fevers or chills.  Assessment & Plan: Visit Diagnoses:  1. Cervical radiculopathy     Plan: Impression is cervical spine radiculopathy.  Likely has some rotator cuff pathology in the right shoulder which is mild but he does not want to do any type of surgical intervention for that.  He would like to try 1 more cervical spine ESI.  Refer to Dr. Eldonna and he will follow-up with us  as needed.  Follow-Up Instructions: No follow-ups on file.   Orders:  No orders of the defined types were placed in this encounter.  No orders of the defined types were placed in this encounter.     Procedures: No procedures performed   Clinical Data: No additional findings.  Objective: Vital Signs: There were no vitals taken for this visit.  Physical Exam:  Constitutional: Patient appears well-developed HEENT:  Head: Normocephalic Eyes:EOM are normal Neck: Normal range of motion Cardiovascular: Normal rate Pulmonary/chest: Effort normal Neurologic: Patient is alert Skin: Skin is  warm Psychiatric: Patient has normal mood and affect  Ortho Exam: Ortho exam demonstrates mildly painful cervical spine range of motion but he does have flexion chin to chest extension about 30 to 40 degrees and rotation about 45 degrees.  No muscle atrophy left arm versus right arm.  Radial pulse intact bilaterally.  Does have a little bit more coarseness with passive range of motion of the right shoulder compared to the left shoulder but internal and external rotation strength with subscap and infraspinatus testing is 5 out of 5 in both shoulders.  No Popeye deformity present.  No definite paresthesias C5-T1.  Specialty Comments:  NERVE CONDUCTION STUDIES:   Nerve conduction studies were performed on both lower extremities. The distal motor latencies for the peroneal nerves and posterior tibial nerves were within normal limits bilaterally. The motor amplitudes for the peroneal nerves were normal on the right, low on the left, and normal for the posterior tibial nerves bilaterally. The nerve conduction velocities for the peroneal and posterior tibial nerves were normal bilaterally. The peroneal sensory latencies were normal laterally. The H reflex latencies were symmetric and normal.     EMG STUDIES:   EMG study was performed on the left lower extremity:   The tibialis anterior muscle reveals 2 to 5K motor units with decreased recruitment. No fibrillations or positive waves were seen. The peroneus  tertius muscle reveals 2 to 4K motor units with decreased recruitment. 1 plus fibrillations and positive waves were seen. The medial gastrocnemius muscle reveals 3 to 6K motor units with decreased recruitment. No fibrillations or positive waves were seen. The vastus lateralis muscle reveals 2 to 4K motor units with full recruitment. No fibrillations or positive waves were seen. The iliopsoas muscle reveals 2 to 4K motor units with full recruitment. No fibrillations or positive waves were seen. The  biceps femoris muscle (long head) reveals 2 to 6K motor units with decreased recruitment. 2 plus fibrillations and positive waves were seen. The lumbosacral paraspinal muscles were tested at 3 levels, and revealed no abnormalities of insertional activity in the upper 2 levels tested. Complex repetitive discharges were seen at the lower level. There was good relaxation.   EMG study was performed on the right lower extremity:   The tibialis anterior muscle reveals 2 to 4K motor units with full recruitment. No fibrillations or positive waves were seen. The peroneus tertius muscle reveals 2 to 4K motor units with full recruitment. No fibrillations or positive waves were seen. The medial gastrocnemius muscle reveals 1 to 3K motor units with full recruitment. No fibrillations or positive waves were seen. The vastus lateralis muscle reveals 2 to 4K motor units with full recruitment. No fibrillations or positive waves were seen. The iliopsoas muscle reveals 2 to 4K motor units with full recruitment. No fibrillations or positive waves were seen. The biceps femoris muscle (long head) reveals 2 to 4K motor units with full recruitment. No fibrillations or positive waves were seen. The lumbosacral paraspinal muscles were tested at 3 levels, and revealed no abnormalities of insertional activity at all 3 levels tested. There was good relaxation.       IMPRESSION:   Nerve conduction studies done on both lower extremities show no clear evidence of a peripheral neuropathy. The motor amplitudes of the left peroneal nerve was low, with a normal sensory latency. This finding is consistent with the EMG evaluation of the left lower extremity which shows evidence of a mixed chronic and acute L5 radiculopathy, and evidence of a chronic S1 radiculopathy. No abnormalities were noted on the right lower extremity. There is no evidence of a right lumbosacral radiculopathy.   Bryan Mccullough 03/03/2013 4:49  PM  Imaging: No results found.   PMFS History: Patient Active Problem List   Diagnosis Date Noted   Diabetes mellitus type 2 in nonobese (HCC)    HLD (hyperlipidemia)    BPH with elevated PSA    Esophageal stricture    Erectile dysfunction    Lumbosacral root lesions, not elsewhere classified 03/03/2013   Past Medical History:  Diagnosis Date   BPH with elevated PSA    Diabetes mellitus type 2 in nonobese [facility])    Diverticulosis    Erectile dysfunction    Esophageal stricture    GERD (gastroesophageal reflux disease)    Hiatal hernia    HLD (hyperlipidemia)     Family History  Problem Relation Age of Onset   Heart disease Brother    Stroke Brother     Past Surgical History:  Procedure Laterality Date   PROSTATE SURGERY     Social History   Occupational History   Not on file  Tobacco Use   Smoking status: Never   Smokeless tobacco: Never  Vaping Use   Vaping status: Never Used  Substance and Sexual Activity   Alcohol use: No   Drug use: No  Sexual activity: Yes    Comment: married, bus driver

## 2024-09-04 ENCOUNTER — Telehealth: Payer: Self-pay | Admitting: Physical Medicine and Rehabilitation

## 2024-09-04 NOTE — Telephone Encounter (Signed)
 Patient returning a call to schedule with [facility].

## 2024-09-18 ENCOUNTER — Ambulatory Visit: Admitting: Physical Medicine and Rehabilitation

## 2024-09-18 ENCOUNTER — Encounter: Payer: Self-pay | Admitting: Physical Medicine and Rehabilitation

## 2024-09-18 DIAGNOSIS — M79601 Pain in right arm: Secondary | ICD-10-CM | POA: Diagnosis not present

## 2024-09-18 DIAGNOSIS — M79602 Pain in left arm: Secondary | ICD-10-CM | POA: Diagnosis not present

## 2024-09-18 DIAGNOSIS — M5412 Radiculopathy, cervical region: Secondary | ICD-10-CM

## 2024-09-18 DIAGNOSIS — M79641 Pain in right hand: Secondary | ICD-10-CM | POA: Diagnosis not present

## 2024-09-18 DIAGNOSIS — M79642 Pain in left hand: Secondary | ICD-10-CM | POA: Diagnosis not present

## 2024-09-18 NOTE — Progress Notes (Signed)
 Bryan Mccullough - 83 y.o. male MRN [id number]  Date of birth: 11/04/1941  Office Visit Note: Visit Date: 09/18/2024 PCP: Duanne Butler DASEN, MD Referred by: Duanne Butler DASEN, MD  Subjective: Chief Complaint  Patient presents with   Neck - Follow-up   HPI: Bryan Mccullough is a 83 y.o. male who comes in today per the request of Dr. Glendia Hutchinson for evaluation of chronic, worsening and severe pain to elbows radiating down arms to hands. Patient is somewhat of a poor historian. Spanish interpreter at bedside. Pain started in April of this year. No specific aggravating factors. He is unable to describe his pain. States both of his hands feel weak. Some relief of pain with home exercise regimen, rest and use of medications. Some relief of pain with Meloxicam . History of formal physical therapy with minimal relief of pain. Cervical MRI imaging from July of this year shows multifactorial degenerative changes with resultant multilevel foraminal narrowing. There is also moderate multilevel facet arthrosis, most pronounced at C4-C5 on the left. No high grade spinal canal stenosis noted. He underwent right C7-T1 interlaminar epidural steroid injection in our office on 08/10/2024. He reports greater than 50% relief of pain with injection for 1 month. Patient denies focal weakness, numbness and tingling. No recent trauma or falls.   Of note, bilateral upper extremity NCV with EMG done by Dr. Eldonna on 06/06/2024 shows severe median neuropathy (carpal tunnel syndrome) on the left.      Review of Systems  Musculoskeletal:  Positive for neck pain.  Neurological:  Negative for tingling, sensory change, focal weakness and weakness.  All other systems reviewed and are negative.  Otherwise per HPI.  Assessment & Plan: Visit Diagnoses:    ICD-10-CM   1. Cervical radiculopathy  M54.12     2. Bilateral arm pain  M79.601    M79.602     3. Bilateral hand pain  M79.641    M79.642        Plan: Findings:   Chronic, worsening and severe pain to elbows radiating down arms to hands. He continues to have pain despite good conservative therapies such as formal physical therapy, home exercise regimen, rest and use of medications. Recent cervical MRI imaging does show multi level foraminal stenosis and arthritic changes. No pain to neck today. We discussed treatment plan in detail today. Next step is to perform diagnostic and hopefully therapeutic right C7-T1 interlaminar epidural steroid injection under fluoroscopic guidance.  Patient is not currently taking anticoagulant medication.  I explained to patient that we do not perform these injections often, if his pain continues would refer him back to see Dr. Hutchinson.  He has no questions at this time.  His exam today is nonfocal, good strength noted to bilateral upper extremities.    Meds & Orders: No orders of the defined types were placed in this encounter.  No orders of the defined types were placed in this encounter.   Follow-up: Return for Right C7-T1 interlaminar epidural steroid injection.   Procedures: No procedures performed      Clinical History: CLINICAL DATA:  Initial evaluation for cervical radiculopathy, neck pain with radiation into bilateral arms and left hand.   EXAM: MRI CERVICAL SPINE WITHOUT CONTRAST   TECHNIQUE: Multiplanar, multisequence MR imaging of the cervical spine was performed. No intravenous contrast was administered.   COMPARISON:  Radiograph from 04/24/2024.   FINDINGS: Alignment: Straightening with reversal of the normal cervical lordosis, apex at C6. Underlying mild dextroscoliosis with rotatory component. Trace  degenerative anterolisthesis of C4 on C5 and C5 on C6 as well as C7 on T1.   Vertebrae: Vertebral body height maintained without acute or chronic fracture. Bone marrow signal intensity within normal limits. No worrisome osseous lesions. No abnormal marrow edema.   Cord: Normal signal morphology.    Posterior Fossa, vertebral arteries, paraspinal tissues: Unremarkable.   Disc levels:   C2-C3: Negative interspace. Moderate left facet hypertrophy. No spinal stenosis. Mild left C3 foraminal narrowing. Right neural foramen remains patent.   C3-C4: Minimal disc bulge with mild right-sided uncovertebral spurring. Moderate right with mild left facet hypertrophy. No spinal stenosis. Moderate right C4 foraminal narrowing. Left neural foramen remains patent.   C4-C5: Anterolisthesis. Left eccentric disc bulge with left greater than right uncovertebral spurring. Severe left-sided facet arthrosis. No spinal stenosis. Severe left C5 foraminal narrowing. Right neural foramen remains patent.   C5-C6: Anterolisthesis. Diffuse disc bulge with bilateral uncovertebral spurring. Moderate left with mild right facet arthrosis. No significant spinal stenosis. Moderate to severe bilateral C6 foraminal narrowing.   C6-C7: Diffuse circumferential disc osteophyte complex. Broad posterior component flattens and effaces the ventral thecal sac. Mild spinal stenosis. Moderate to severe bilateral C7 foraminal narrowing.   C7-T1: Mild disc bulge with uncovertebral spurring. Moderate right facet arthrosis. No spinal stenosis. Foramina remain patent.   Visualized upper thoracic spine demonstrates mild noncompressive disc bulging without significant stenosis.   IMPRESSION: 1. Multilevel cervical spondylosis with resultant mild spinal stenosis at C6-7. No other significant spinal stenosis within the cervical spine. 2. Multifactorial degenerative changes with resultant multilevel foraminal narrowing as above. Notable findings include moderate right C4 foraminal stenosis, severe left C5 foraminal narrowing, with moderate to severe bilateral C6 and C7 foraminal stenosis. 3. Moderate multilevel facet arthrosis as above, most pronounced at C4-5 on the left. Findings could contribute to neck pain.      Electronically Signed   By: Morene Hoard M.D.   On: 06/14/2024 22:34   He reports that he has never smoked. He has never used smokeless tobacco.  Recent Labs    10/08/23 1228 03/03/24 1233 05/16/24 1457  HGBA1C 6.9* 6.8*  --   LABURIC  --   --  5.8    Objective:  VS:  HT:    WT:   BMI:     BP:   HR: bpm  TEMP: ( )  RESP:  Physical Exam Vitals and nursing note reviewed.  HENT:     Head: Normocephalic and atraumatic.     Right Ear: External ear normal.     Left Ear: External ear normal.     Nose: Nose normal.     Mouth/Throat:     Mouth: Mucous membranes are moist.  Eyes:     Extraocular Movements: Extraocular movements intact.  Cardiovascular:     Rate and Rhythm: Normal rate.     Pulses: Normal pulses.  Pulmonary:     Effort: Pulmonary effort is normal.  Abdominal:     General: Abdomen is flat. There is no distension.  Musculoskeletal:        General: Tenderness present.     Cervical back: Tenderness present.     Comments: Mild pain noted with flexion, extension and side-to-side rotation. Patient has good strength in the upper extremities including 5 out of 5 strength in wrist extension, long finger flexion and APB. Shoulder range of motion is full bilaterally without any sign of impingement. There is no atrophy of the hands intrinsically. Sensation intact bilaterally. Negative Hoffman's sign.  Negative Spurling's sign.     Skin:    General: Skin is warm and dry.     Capillary Refill: Capillary refill takes less than 2 seconds.  Neurological:     General: No focal deficit present.     Mental Status: He is alert and oriented to person, place, and time.  Psychiatric:        Mood and Affect: Mood normal.        Behavior: Behavior normal.     Ortho Exam  Imaging: No results found.  Past Medical/Family/Surgical/Social History: Medications & Allergies reviewed per EMR, new medications updated. Patient Active Problem List   Diagnosis Date Noted    Diabetes mellitus type 2 in nonobese (HCC)    HLD (hyperlipidemia)    BPH with elevated PSA    Esophageal stricture    Erectile dysfunction    Lumbosacral root lesions, not elsewhere classified 03/03/2013   Past Medical History:  Diagnosis Date   BPH with elevated PSA    Diabetes mellitus type 2 in nonobese [facility])    Diverticulosis    Erectile dysfunction    Esophageal stricture    GERD (gastroesophageal reflux disease)    Hiatal hernia    HLD (hyperlipidemia)    Family History  Problem Relation Age of Onset   Heart disease Brother    Stroke Brother    Past Surgical History:  Procedure Laterality Date   PROSTATE SURGERY     Social History   Occupational History   Not on file  Tobacco Use   Smoking status: Never   Smokeless tobacco: Never  Vaping Use   Vaping status: Never Used  Substance and Sexual Activity   Alcohol use: No   Drug use: No   Sexual activity: Yes    Comment: married, bus driver

## 2024-09-18 NOTE — Progress Notes (Signed)
 Pain Scale   Average Pain 0 Patient advised  he has pressure in his neck and weakness in his hands.        +Driver, -BT, -Dye Allergies.

## 2024-10-11 ENCOUNTER — Other Ambulatory Visit: Payer: Self-pay

## 2024-10-11 ENCOUNTER — Ambulatory Visit: Admitting: Physical Medicine and Rehabilitation

## 2024-10-11 VITALS — BP 133/72 | HR 85

## 2024-10-11 DIAGNOSIS — M5412 Radiculopathy, cervical region: Secondary | ICD-10-CM | POA: Diagnosis not present

## 2024-10-11 MED ORDER — METHYLPREDNISOLONE ACETATE 40 MG/ML IJ SUSP
40.0000 mg | Freq: Once | INTRAMUSCULAR | Status: AC
  Administered 2024-10-11: 40 mg

## 2024-10-11 NOTE — Progress Notes (Signed)
 Bryan Mccullough - 83 y.o. male MRN [id number]  Date of birth: 12-04-1941  Office Visit Note: Visit Date: 10/11/2024 PCP: Duanne Butler DASEN, MD Referred by: Duanne Butler DASEN, MD  Subjective: Chief Complaint  Patient presents with   Neck - Pain   HPI:  Bryan Mccullough is a 83 y.o. male who comes in today for planned repeat Right C7-T1  Cervical Interlaminar epidural steroid injection with fluoroscopic guidance.  The patient has failed conservative care including home exercise, medications, time and activity modification.  This injection will be diagnostic and hopefully therapeutic.  Please see requesting physician notes for further details and justification. Patient received more than 50% pain relief from prior injection.   Referring: Dr. JUDITHANN Hamilton Dean   ROS Otherwise per HPI.  Assessment & Plan: Visit Diagnoses:    ICD-10-CM   1. Cervical radiculopathy  M54.12 XR C-ARM NO REPORT    Epidural Steroid injection    methylPREDNISolone  acetate (DEPO-MEDROL ) injection 40 mg      Plan: No additional findings.   Meds & Orders:  Meds ordered this encounter  Medications   methylPREDNISolone  acetate (DEPO-MEDROL ) injection 40 mg    Orders Placed This Encounter  Procedures   XR C-ARM NO REPORT   Epidural Steroid injection    Follow-up: Return for visit to requesting provider as needed.   Procedures: No procedures performed  Cervical Epidural Steroid Injection - Interlaminar Approach with Fluoroscopic Guidance  Patient: Bryan Mccullough      Date of Birth: May 04, 1941 MRN: [id number] PCP: Duanne Butler DASEN, MD      Visit Date: 10/11/2024   Universal Protocol:    Date/Time: 11/05/252:13 PM  Consent Given By: the patient  Position: PRONE  Additional Comments: Vital signs were monitored before and after the procedure. Patient was prepped and draped in the usual sterile fashion. The correct patient, procedure, and site was verified.   Injection Procedure Details:    Procedure diagnoses: Cervical radiculopathy [M54.12]    Meds Administered:  Meds ordered this encounter  Medications   methylPREDNISolone  acetate (DEPO-MEDROL ) injection 40 mg     Laterality: Right  Location/Site: C7-T1  Needle: 3.5 in., 20 ga. Tuohy  Needle Placement: Paramedian epidural space  Findings:  -Comments: Excellent flow of contrast into the epidural space.  Procedure Details: Using a paramedian approach from the side mentioned above, the region overlying the inferior lamina was localized under fluoroscopic visualization and the soft tissues overlying this structure were infiltrated with 4 ml. of 1% Lidocaine  without Epinephrine. A # 20 gauge, Tuohy needle was inserted into the epidural space using a paramedian approach.  The epidural space was localized using loss of resistance along with contralateral oblique bi-planar fluoroscopic views.  After negative aspirate for air, blood, and CSF, a 2 ml. volume of Isovue -250 was injected into the epidural space and the flow of contrast was observed. Radiographs were obtained for documentation purposes.   The injectate was administered into the level noted above.  Additional Comments:  The patient tolerated the procedure well Dressing: 2 x 2 sterile gauze and Band-Aid    Post-procedure details: Patient was observed during the procedure. Post-procedure instructions were reviewed.  Patient left the clinic in stable condition.   Clinical History: CLINICAL DATA:  Initial evaluation for cervical radiculopathy, neck pain with radiation into bilateral arms and left hand.   EXAM: MRI CERVICAL SPINE WITHOUT CONTRAST   TECHNIQUE: Multiplanar, multisequence MR imaging of the cervical spine was performed. No intravenous contrast was administered.   COMPARISON:  Radiograph from 04/24/2024.   FINDINGS: Alignment: Straightening with reversal of the normal cervical lordosis, apex at C6. Underlying mild dextroscoliosis with  rotatory component. Trace degenerative anterolisthesis of C4 on C5 and C5 on C6 as well as C7 on T1.   Vertebrae: Vertebral body height maintained without acute or chronic fracture. Bone marrow signal intensity within normal limits. No worrisome osseous lesions. No abnormal marrow edema.   Cord: Normal signal morphology.   Posterior Fossa, vertebral arteries, paraspinal tissues: Unremarkable.   Disc levels:   C2-C3: Negative interspace. Moderate left facet hypertrophy. No spinal stenosis. Mild left C3 foraminal narrowing. Right neural foramen remains patent.   C3-C4: Minimal disc bulge with mild right-sided uncovertebral spurring. Moderate right with mild left facet hypertrophy. No spinal stenosis. Moderate right C4 foraminal narrowing. Left neural foramen remains patent.   C4-C5: Anterolisthesis. Left eccentric disc bulge with left greater than right uncovertebral spurring. Severe left-sided facet arthrosis. No spinal stenosis. Severe left C5 foraminal narrowing. Right neural foramen remains patent.   C5-C6: Anterolisthesis. Diffuse disc bulge with bilateral uncovertebral spurring. Moderate left with mild right facet arthrosis. No significant spinal stenosis. Moderate to severe bilateral C6 foraminal narrowing.   C6-C7: Diffuse circumferential disc osteophyte complex. Broad posterior component flattens and effaces the ventral thecal sac. Mild spinal stenosis. Moderate to severe bilateral C7 foraminal narrowing.   C7-T1: Mild disc bulge with uncovertebral spurring. Moderate right facet arthrosis. No spinal stenosis. Foramina remain patent.   Visualized upper thoracic spine demonstrates mild noncompressive disc bulging without significant stenosis.   IMPRESSION: 1. Multilevel cervical spondylosis with resultant mild spinal stenosis at C6-7. No other significant spinal stenosis within the cervical spine. 2. Multifactorial degenerative changes with resultant  multilevel foraminal narrowing as above. Notable findings include moderate right C4 foraminal stenosis, severe left C5 foraminal narrowing, with moderate to severe bilateral C6 and C7 foraminal stenosis. 3. Moderate multilevel facet arthrosis as above, most pronounced at C4-5 on the left. Findings could contribute to neck pain.     Electronically Signed   By: Morene Hoard M.D.   On: 06/14/2024 22:34     Objective:  VS:  HT:    WT:   BMI:     BP:133/72  HR:85bpm  TEMP: ( )  RESP:  Physical Exam Vitals and nursing note reviewed.  Constitutional:      General: He is not in acute distress.    Appearance: Normal appearance. He is not ill-appearing.  HENT:     Head: Normocephalic and atraumatic.     Right Ear: External ear normal.     Left Ear: External ear normal.  Eyes:     Extraocular Movements: Extraocular movements intact.  Cardiovascular:     Rate and Rhythm: Normal rate.     Pulses: Normal pulses.  Abdominal:     General: There is no distension.     Palpations: Abdomen is soft.  Musculoskeletal:        General: No signs of injury.     Cervical back: Neck supple. Tenderness present. No rigidity.     Right lower leg: No edema.     Left lower leg: No edema.     Comments: Patient has good strength in the upper extremities with 5 out of 5 strength in wrist extension long finger flexion APB.  No intrinsic hand muscle atrophy.  Negative Hoffmann's test.  Lymphadenopathy:     Cervical: No cervical adenopathy.  Skin:    Findings: No erythema or rash.  Neurological:  General: No focal deficit present.     Mental Status: He is alert and oriented to person, place, and time.     Sensory: No sensory deficit.     Motor: No weakness or abnormal muscle tone.     Coordination: Coordination normal.  Psychiatric:        Mood and Affect: Mood normal.        Behavior: Behavior normal.      Imaging: No results found.

## 2024-10-11 NOTE — Progress Notes (Signed)
 Pain Scale   Average Pain 8 Patient advised he has neck pain that radiates to bilateral shoulders and pain is worse in cold months.        +Driver, -BT, -Dye Allergies.

## 2024-10-11 NOTE — Procedures (Signed)
 Cervical Epidural Steroid Injection - Interlaminar Approach with Fluoroscopic Guidance  Patient: Bryan Mccullough      Date of Birth: 11-May-1941 MRN: [id number] PCP: Duanne Butler DASEN, MD      Visit Date: 10/11/2024   Universal Protocol:    Date/Time: 11/05/252:13 PM  Consent Given By: the patient  Position: PRONE  Additional Comments: Vital signs were monitored before and after the procedure. Patient was prepped and draped in the usual sterile fashion. The correct patient, procedure, and site was verified.   Injection Procedure Details:   Procedure diagnoses: Cervical radiculopathy [M54.12]    Meds Administered:  Meds ordered this encounter  Medications   methylPREDNISolone  acetate (DEPO-MEDROL ) injection 40 mg     Laterality: Right  Location/Site: C7-T1  Needle: 3.5 in., 20 ga. Tuohy  Needle Placement: Paramedian epidural space  Findings:  -Comments: Excellent flow of contrast into the epidural space.  Procedure Details: Using a paramedian approach from the side mentioned above, the region overlying the inferior lamina was localized under fluoroscopic visualization and the soft tissues overlying this structure were infiltrated with 4 ml. of 1% Lidocaine  without Epinephrine. A # 20 gauge, Tuohy needle was inserted into the epidural space using a paramedian approach.  The epidural space was localized using loss of resistance along with contralateral oblique bi-planar fluoroscopic views.  After negative aspirate for air, blood, and CSF, a 2 ml. volume of Isovue -250 was injected into the epidural space and the flow of contrast was observed. Radiographs were obtained for documentation purposes.   The injectate was administered into the level noted above.  Additional Comments:  The patient tolerated the procedure well Dressing: 2 x 2 sterile gauze and Band-Aid    Post-procedure details: Patient was observed during the procedure. Post-procedure instructions were  reviewed.  Patient left the clinic in stable condition.

## 2024-10-20 ENCOUNTER — Other Ambulatory Visit: Payer: Self-pay | Admitting: Family Medicine

## 2024-11-17 DIAGNOSIS — H524 Presbyopia: Secondary | ICD-10-CM | POA: Diagnosis not present

## 2024-11-17 DIAGNOSIS — H04123 Dry eye syndrome of bilateral lacrimal glands: Secondary | ICD-10-CM | POA: Diagnosis not present

## 2024-11-17 DIAGNOSIS — Z135 Encounter for screening for eye and ear disorders: Secondary | ICD-10-CM | POA: Diagnosis not present

## 2024-11-17 DIAGNOSIS — H5203 Hypermetropia, bilateral: Secondary | ICD-10-CM | POA: Diagnosis not present

## 2024-11-17 DIAGNOSIS — H2513 Age-related nuclear cataract, bilateral: Secondary | ICD-10-CM | POA: Diagnosis not present

## 2025-01-11 ENCOUNTER — Telehealth: Payer: Self-pay | Admitting: Family Medicine

## 2025-01-11 NOTE — Telephone Encounter (Signed)
 Copied from CRM [id number]. Topic: General - Other >> Jan 11, 2025  1:54 PM Santiya F wrote: Reason for CRM: Patient is calling in because he received a call from the office after 5 PM yesterday. Reviewed the chart and didn't see any notes regarding a call. Please advise.

## 2025-01-26 ENCOUNTER — Ambulatory Visit: Payer: Self-pay
